# Patient Record
Sex: Female | Born: 1975 | Race: Black or African American | Hispanic: No | Smoking: Never smoker
Health system: Southern US, Community
[De-identification: ages and names within clinical notes are randomized; demographics above are authoritative.]

## PROBLEM LIST (undated history)

## (undated) DIAGNOSIS — G8929 Other chronic pain: Secondary | ICD-10-CM

## (undated) DIAGNOSIS — R7303 Prediabetes: Secondary | ICD-10-CM

## (undated) DIAGNOSIS — K589 Irritable bowel syndrome without diarrhea: Secondary | ICD-10-CM

## (undated) DIAGNOSIS — K829 Disease of gallbladder, unspecified: Secondary | ICD-10-CM

## (undated) DIAGNOSIS — E739 Lactose intolerance, unspecified: Secondary | ICD-10-CM

## (undated) DIAGNOSIS — K219 Gastro-esophageal reflux disease without esophagitis: Secondary | ICD-10-CM

## (undated) DIAGNOSIS — E7800 Pure hypercholesterolemia, unspecified: Secondary | ICD-10-CM

## (undated) DIAGNOSIS — Z91018 Allergy to other foods: Secondary | ICD-10-CM

## (undated) DIAGNOSIS — F419 Anxiety disorder, unspecified: Secondary | ICD-10-CM

## (undated) DIAGNOSIS — G4730 Sleep apnea, unspecified: Secondary | ICD-10-CM

## (undated) DIAGNOSIS — E119 Type 2 diabetes mellitus without complications: Secondary | ICD-10-CM

## (undated) DIAGNOSIS — E349 Endocrine disorder, unspecified: Secondary | ICD-10-CM

## (undated) DIAGNOSIS — I10 Essential (primary) hypertension: Secondary | ICD-10-CM

## (undated) HISTORY — DX: Sleep apnea, unspecified: G47.30

## (undated) HISTORY — DX: Anxiety disorder, unspecified: F41.9

## (undated) HISTORY — DX: Irritable bowel syndrome, unspecified: K58.9

## (undated) HISTORY — DX: Pure hypercholesterolemia, unspecified: E78.00

## (undated) HISTORY — DX: Endocrine disorder, unspecified: E34.9

## (undated) HISTORY — DX: Disease of gallbladder, unspecified: K82.9

## (undated) HISTORY — DX: Essential (primary) hypertension: I10

## (undated) HISTORY — DX: Allergy to other foods: Z91.018

## (undated) HISTORY — PX: CHOLECYSTECTOMY: SHX55

## (undated) HISTORY — DX: Lactose intolerance, unspecified: E73.9

## (undated) HISTORY — DX: Prediabetes: R73.03

## (undated) HISTORY — DX: Gastro-esophageal reflux disease without esophagitis: K21.9

## (undated) HISTORY — DX: Type 2 diabetes mellitus without complications: E11.9

## (undated) HISTORY — DX: Other chronic pain: G89.29

---

## 1999-11-06 ENCOUNTER — Encounter: Admission: RE | Admit: 1999-11-06 | Discharge: 2000-02-04 | Payer: Self-pay

## 2000-02-24 ENCOUNTER — Other Ambulatory Visit: Admission: RE | Admit: 2000-02-24 | Discharge: 2000-02-24 | Payer: Self-pay | Admitting: *Deleted

## 2001-03-04 ENCOUNTER — Other Ambulatory Visit: Admission: RE | Admit: 2001-03-04 | Discharge: 2001-03-04 | Payer: Self-pay | Admitting: Gynecology

## 2002-03-04 ENCOUNTER — Other Ambulatory Visit: Admission: RE | Admit: 2002-03-04 | Discharge: 2002-03-04 | Payer: Self-pay | Admitting: *Deleted

## 2002-08-15 ENCOUNTER — Encounter: Payer: Self-pay | Admitting: Gastroenterology

## 2002-08-15 ENCOUNTER — Encounter: Admission: RE | Admit: 2002-08-15 | Discharge: 2002-08-15 | Payer: Self-pay | Admitting: Gastroenterology

## 2002-08-17 ENCOUNTER — Ambulatory Visit (HOSPITAL_COMMUNITY): Admission: RE | Admit: 2002-08-17 | Discharge: 2002-08-17 | Payer: Self-pay | Admitting: Gastroenterology

## 2002-08-17 ENCOUNTER — Encounter: Payer: Self-pay | Admitting: Gastroenterology

## 2002-08-18 ENCOUNTER — Emergency Department (HOSPITAL_COMMUNITY): Admission: EM | Admit: 2002-08-18 | Discharge: 2002-08-19 | Payer: Self-pay | Admitting: Emergency Medicine

## 2002-12-14 ENCOUNTER — Encounter: Admission: RE | Admit: 2002-12-14 | Discharge: 2003-03-14 | Payer: Self-pay | Admitting: Family Medicine

## 2003-03-06 ENCOUNTER — Other Ambulatory Visit: Admission: RE | Admit: 2003-03-06 | Discharge: 2003-03-06 | Payer: Self-pay | Admitting: Gynecology

## 2003-10-31 ENCOUNTER — Ambulatory Visit (HOSPITAL_COMMUNITY): Admission: RE | Admit: 2003-10-31 | Discharge: 2003-10-31 | Payer: Self-pay | Admitting: Gastroenterology

## 2004-03-06 ENCOUNTER — Other Ambulatory Visit: Admission: RE | Admit: 2004-03-06 | Discharge: 2004-03-06 | Payer: Self-pay | Admitting: Gynecology

## 2004-05-14 ENCOUNTER — Encounter: Admission: RE | Admit: 2004-05-14 | Discharge: 2004-05-14 | Payer: Self-pay | Admitting: Gastroenterology

## 2005-03-07 ENCOUNTER — Other Ambulatory Visit: Admission: RE | Admit: 2005-03-07 | Discharge: 2005-03-07 | Payer: Self-pay | Admitting: Gynecology

## 2005-04-30 ENCOUNTER — Encounter: Admission: RE | Admit: 2005-04-30 | Discharge: 2005-04-30 | Payer: Self-pay | Admitting: Gynecology

## 2005-08-27 ENCOUNTER — Encounter: Admission: RE | Admit: 2005-08-27 | Discharge: 2005-09-10 | Payer: Self-pay | Admitting: Gynecology

## 2005-10-24 ENCOUNTER — Ambulatory Visit: Payer: Self-pay | Admitting: Obstetrics & Gynecology

## 2005-10-24 ENCOUNTER — Ambulatory Visit (HOSPITAL_COMMUNITY): Admission: RE | Admit: 2005-10-24 | Discharge: 2005-10-24 | Payer: Self-pay | Admitting: Gynecology

## 2005-11-16 ENCOUNTER — Inpatient Hospital Stay (HOSPITAL_COMMUNITY): Admission: AD | Admit: 2005-11-16 | Discharge: 2005-11-21 | Payer: Self-pay | Admitting: Gynecology

## 2005-11-17 ENCOUNTER — Encounter (INDEPENDENT_AMBULATORY_CARE_PROVIDER_SITE_OTHER): Payer: Self-pay | Admitting: *Deleted

## 2005-11-22 ENCOUNTER — Encounter: Admission: RE | Admit: 2005-11-22 | Discharge: 2005-12-10 | Payer: Self-pay | Admitting: Gynecology

## 2005-12-29 ENCOUNTER — Other Ambulatory Visit: Admission: RE | Admit: 2005-12-29 | Discharge: 2005-12-29 | Payer: Self-pay | Admitting: Gynecology

## 2006-12-31 ENCOUNTER — Other Ambulatory Visit: Admission: RE | Admit: 2006-12-31 | Discharge: 2006-12-31 | Payer: Self-pay | Admitting: Gynecology

## 2007-10-11 ENCOUNTER — Ambulatory Visit: Payer: Self-pay | Admitting: Women's Health

## 2008-01-28 ENCOUNTER — Ambulatory Visit: Payer: Self-pay | Admitting: Women's Health

## 2008-01-28 ENCOUNTER — Other Ambulatory Visit: Admission: RE | Admit: 2008-01-28 | Discharge: 2008-01-28 | Payer: Self-pay | Admitting: Gynecology

## 2008-01-28 ENCOUNTER — Encounter: Payer: Self-pay | Admitting: Women's Health

## 2008-03-29 ENCOUNTER — Ambulatory Visit: Payer: Self-pay | Admitting: Women's Health

## 2008-06-16 ENCOUNTER — Encounter: Admission: RE | Admit: 2008-06-16 | Discharge: 2008-06-16 | Payer: Self-pay | Admitting: Family Medicine

## 2008-06-27 ENCOUNTER — Ambulatory Visit: Payer: Self-pay | Admitting: Gynecology

## 2008-08-02 ENCOUNTER — Encounter (INDEPENDENT_AMBULATORY_CARE_PROVIDER_SITE_OTHER): Payer: Self-pay | Admitting: General Surgery

## 2008-08-02 ENCOUNTER — Ambulatory Visit (HOSPITAL_COMMUNITY): Admission: RE | Admit: 2008-08-02 | Discharge: 2008-08-02 | Payer: Self-pay | Admitting: General Surgery

## 2009-08-04 ENCOUNTER — Emergency Department (HOSPITAL_COMMUNITY): Admission: EM | Admit: 2009-08-04 | Discharge: 2009-08-04 | Payer: Self-pay | Admitting: Emergency Medicine

## 2010-02-25 ENCOUNTER — Other Ambulatory Visit: Payer: Self-pay | Admitting: Family Medicine

## 2010-02-25 ENCOUNTER — Other Ambulatory Visit (HOSPITAL_COMMUNITY)
Admission: RE | Admit: 2010-02-25 | Discharge: 2010-02-25 | Disposition: A | Discharge status: Home / Self Care | Payer: 59 | Source: Ambulatory Visit | Attending: Family Medicine | Admitting: Family Medicine

## 2010-02-25 DIAGNOSIS — Z1159 Encounter for screening for other viral diseases: Secondary | ICD-10-CM | POA: Insufficient documentation

## 2010-02-25 DIAGNOSIS — Z124 Encounter for screening for malignant neoplasm of cervix: Secondary | ICD-10-CM | POA: Insufficient documentation

## 2010-04-21 LAB — CBC
Hemoglobin: 12.2 g/dL (ref 12.0–15.0)
MCHC: 33.6 g/dL (ref 30.0–36.0)
RBC: 4.06 MIL/uL (ref 3.87–5.11)

## 2010-04-21 LAB — BASIC METABOLIC PANEL
CO2: 28 mEq/L (ref 19–32)
Calcium: 9 mg/dL (ref 8.4–10.5)
Chloride: 104 mEq/L (ref 96–112)
GFR calc Af Amer: >60 mL/min (ref >60)
Sodium: 139 mEq/L (ref 135–145)

## 2010-04-21 LAB — DIFFERENTIAL
Basophils Relative: 1 % (ref 0–1)
Lymphs Abs: 2 10*3/uL (ref 0.7–4.0)
Monocytes Absolute: 0.6 10*3/uL (ref 0.1–1.0)
Monocytes Relative: 9 % (ref 3–12)
Neutro Abs: 4.4 10*3/uL (ref 1.7–7.7)

## 2010-05-28 NOTE — Op Note (Signed)
Robin Glover, Robin Glover         ACCOUNT NO.:  [id number]   MEDICAL RECORD NO.:  [id number]          PATIENT TYPE:  AMB   LOCATION:  DAY                          FACILITY:  [facility]   PHYSICIAN:  Almond Lint, MD       DATE OF BIRTH:  August 16, 1975   DATE OF PROCEDURE:  08/02/2008  DATE OF DISCHARGE:                               OPERATIVE REPORT   PREOPERATIVE DIAGNOSES:  Chronic cholelithiasis.   POSTOPERATIVE DIAGNOSIS:  Chronic cholelithiasis.   PROCEDURE PERFORMED:  Laparoscopic cholecystectomy.   SURGEON:  Almond Lint, M.D.   ASSISTANT:  None.   ANESTHESIA:  General plus local.   FINDINGS:  Stones in gallbladder.   SPECIMENS:  Gallbladder to pathology.   ESTIMATED BLOOD LOSS:  Minimal.   COMPLICATIONS:  None known.   PROCEDURE:  Robin Glover was identified in the holding area and  taken to the operating room,  where she was placed supine on the  operating room table.  General anesthesia was induced.  Her abdomen was  prepped and draped in a sterile fashion.  Time-out was performed,  according to the surgical safety check list.  When all was correct we  continued.   The infraumbilical skin was anesthetized with a mixture of 0.25%  Marcaine with epinephrine and 1% lidocaine plain.  A curvilinear  incision was made with a #11 blade below the umbilicus.  The  subcutaneous tissues were dissected bluntly with a Kelly clamp.  The  umbilical stalk was elevated with a Kocher clamp.  The fascia was  cleaned off bluntly and Kochers were then passed to either side of the  midline on the fascia.  An #11 blade was used to incise the fascia.  Entrance into the peritoneal cavity was confirmed with a Kelly clamp.  A  0 Vicryl was used to place a pursestring around the umbilical fascial  incision.  The Hasson trocar was introduced into the abdomen and held  into place with the tails of the suture.  Pneumoperitoneum was achieved  to a pressure of 15 mmHg.   The patient was then  placed into a reverse Trendelenburg position,  rotated to the left.  The skin in the epigastrium and peritoneum was  anesthetized with local anesthetic.  A 12-mm incision was made in the  epigastrium and the 11-mm trocar was advanced under direct  visualization.  Two 5-mm ports were placed in the right upper quadrant  in similar fashion.  The gallbladder was elevated toward the head of the  patient and the infundibulum was retracted toward the right.  There was  a small amount of peritoneal adhesions and these were taken down  bluntly.  The overlying peritoneum over the gallbladder was taken with  the hook cautery and the cystic duct and cystic artery were skeletonized  with the [facility].  These were very close together.  The  artery was clipped when blunt dissection made it start oozing just a  little.  There were two clips placed proximally and one on the specimen  side.  This was then transected.  There was some additional bleeding  just below it and  this was clipped as well.  The cystic duct was clipped  three times on the patient side, once on the specimen side.  This was  the only tubular structure directly entering the gallbladder.  Prior to  clipping this, the peritoneum had been opened up all the way up to the  liver edge, critical views obtained.  Higher up in the gallbladder  fossa, there was a small vein entering the gallbladder, as well, and  this was clipped twice on the patient side, once on the specimen side.  The gallbladder was taken off the gallbladder fossa with the Bovie  electrocautery.  The camera was moved to the epigastric port and then  the gallbladder was retrieved through the umbilical port with the  EndoCatch bag.   The camera was replaced at the umbilical port and the gallbladder fossa  was examined for bleeding.  There was no evidence of bleeding.  The  clips were also examined and there was no evidence of bile leakage or  bleeding from the clips.   The gallbladder fossa and right upper quadrant  were irrigated.  The gallbladder fossa was re-examined and appeared  normal.  The right upper quadrant epigastric ports were removed under  direct visualization.  The pursestring suture was tied down at the  umbilical incision and there was no additional palpable defect  appreciable.  The skin of all incisions was closed using a 4-0 Monocryl  in subcuticular fashion.  The wounds were cleaned, dried and dressed  with Dermabond.  The patient was awakened from anesthesia and taken to  PACU in stable condition.      Almond Lint, MD  Electronically Signed     FB/MEDQ  D:  08/02/2008  T:  08/02/2008  Job:  [id number]   cc:   Charlestine Night, FNP

## 2010-05-31 NOTE — Op Note (Signed)
NAMECAYCEE, Robin Glover              ACCOUNT NO.:  [id number]   MEDICAL RECORD NO.:  [id number]          PATIENT TYPE:  AMB   LOCATION:  ENDO                         FACILITY:  [facility]   PHYSICIAN:  John C. Madilyn Fireman, M.D.    DATE OF BIRTH:  11/28/1975   DATE OF PROCEDURE:  10/31/2003  DATE OF DISCHARGE:                                 OPERATIVE REPORT   PROCEDURE:  Esophagogastroduodenoscopy with biopsy.   INDICATIONS FOR PROCEDURE:  Persistent left upper quadrant abdominal pain,  nausea, bloating, gas as well as rectal bleeding.  Not adequately relieved  by Zelnorm or proton pump inhibitor.   PROCEDURE:  The patient was placed in the left lateral decubitus position  and placed on the pulse monitor with continuous low flow oxygen delivered by  nasal cannula.  She was sedated with 100 mcg IV fentanyl and 8 mg IV Versed.  The Olympus video endoscope was advanced under direct vision into the  oropharynx and esophagus.  The esophagus was straight and of normal caliber  with the squamocolumnar line at 38 cm.  There was no visible hiatal hernia,  ring, stricture, or other abnormality.  The GE junction and stomach was  entered, and a small amount of liquid secretions were suctioned from the  fundus.  Retroflexed view of the cardia was unremarkable.  The fundus, body,  antrum, and pylorus all appeared normal.  The duodenum was entered.  Both  bulb and second portions were well inspected and appeared to be within  normal limits.  The scope was advanced back into the stomach and a CLO test  obtained.  The scope was then withdrawn.  The patient returned to the  recovery room in stable condition.  She tolerated the procedure well, and  there were no immediate complications.   IMPRESSION:  Normal endoscopy.   PLAN:  Await CLO test and treat for eradication of Helicobacter if positive,  otherwise continue to treat for constipation and predominant irritable bowel  syndrome.      JCH/MEDQ  D:   10/31/2003  T:  10/31/2003  Job:  [id number]   cc:   Clovis Riley, FNP  [facility]

## 2010-05-31 NOTE — Op Note (Signed)
Robin Glover, Robin Glover         ACCOUNT NO.:  [id number]   MEDICAL RECORD NO.:  [id number]          PATIENT TYPE:  INP   LOCATION:  9374                          FACILITY:  [facility]   PHYSICIAN:  Juan H. Lily Peer, M.D.DATE OF BIRTH:  [birth year]   DATE OF PROCEDURE:  DATE OF DISCHARGE:                                 OPERATIVE REPORT   DATE OF PROCEDURE:  November 17, 2005   SURGEON:  Gaetano Hawthorne. Lily Peer, M.D.   FIRST ASSISTANT:  Ronald L. Earlene Plater, M.D.   INDICATIONS FOR OPERATION:  An 35 year old gravida 1, para 0 at 27 weeks'  gestation, morbidly obese, gestational diabetic, was admitted in early labor  and was sent for induction the following day.  She had significantly  increasing blood pressures with the highest blood pressure 204/123.  Her  cervix was 5 cm with poor progression in her first stage of labor, despite  adequate labor pattern.  She was taken for primary cesarean section.  The  last blood sugar had been recorded at 84 right before surgery.   PREOPERATIVE DIAGNOSIS:  1. Term intrauterine pregnancy.  2. Morbidly obese  3. Gestational diabetic (insulin and oral hypoglycemic agent).  4. Pre-eclampsia.  5. Arrest of first stage of labor.  6. Non-reassuring fetal heart rate tracing.   POSTOPERATIVE DIAGNOSES:  1. Term intrauterine pregnancy.  2. Morbidly obese  3. Gestational diabetic (insulin and oral hypoglycemic agent).  4. Pre-eclampsia.  5. Arrest of first stage of labor.  6. Non-reassuring fetal heart rate tracing.   OPERATION PERFORMED:   ANESTHESIA:  Epidural.   SURGEON:  Juan H. Lily Peer, M.D.   ASSISTANT:  Chester Holstein. Earlene Plater, M.D.   PROCEDURE PERFORMED:  Primary lower uterine segment transverse cesarean  section.   FINDINGS:  Viable female infant, Apgars of 8 and 9. Weight pending at time  of this dictation.  Arterial cord pH of 7.21. Normal maternal pelvic  anatomy.   DESCRIPTION OF OPERATION:  After the patient adequately counseled she was  taken  to the operating room where she underwent re dosing through her  epidural catheter.  She had received 20 mg of labetalol prior to being taken  to the operating room secondary to her blood pressure which had gone as high  as 204/123.  Fetal heart rate had dropped into the 80's and returned back to  120's. Scalp electrode was removed.  The abdomen was prepped and draped in  usual sterile fashion.  A Pfannenstiel skin incision was made 2 cm above the  symphysis pubis.  The incision was carried down through skin and  subcutaneous tissue, down to the rectus fascia whereby midline nick was  made.  The fascia was incised in transverse fashion.  The visceral  peritoneum was entered cautiously.  The bladder flap was established.  The  lower uterine segment was incised in transverse fashion.  The fetal  umbilical cord had extruded out from the incision.  Upon making the  incision, the newborn's head was delivered.  The nasopharyngeal area was  bulb suctioned.  The newborn was delivered.  The cord was doubly clamped and  excised.  The newborn  gave immediate cry, was shown to the parents and  passed off to the neonatologists who were in attendance and gave the above-  mentioned parameters.  After cord blood was obtained the placenta was  delivered from the intrauterine cavity and passed off, since the patient was  donating her cord blood for public blood banking.  After this, Pitocin drip  was started.  The patient received a gram of Ancef. The uterus was  exteriorized.  Intrauterine cavity was swept clear of remaining products of  conception.  The transverse incision was closed with a running locking  stitch of  0 Vicryl suture, followed by an imbricating stitch of 0 Vicryl  suture.  The tubes and ovaries were normal.  The uterus placed back into the  pelvic cavity.  The pelvic cavity was copiously irrigated with normal saline  solution. After ascertaining adequate hemostasis the fascia was closed  with  a running stitch of 0 Vicryl suture and subcutaneous bleeders were Bovie  cauterized.  The skin was reapproximated with skin clips followed by  Xeroform gauze and 4x8 dressing.  The patient was transferred to the  recovery room with stable vital signs.  Blood loss 600 cc.  Urine output 150  cc.  IV fluids 2 liters of lactated Ringer's.  The patient's last blood  pressure reading in the operating room was 120/48.  Will keep her on  magnesium sulfate, 2 grams per hour, and will titrate her blood pressures  accordingly if indicated, in addition to the above measures.      Juan H. Lily Peer, M.D.  Electronically Signed     JHF/MEDQ  D:  11/17/2005  T:  11/18/2005  Job:  [id number]

## 2010-05-31 NOTE — H&P (Signed)
NAME:  Robin Glover, Robin Glover             ACCOUNT NO.:  0   MEDICAL RECORD NO.:  [id number]           PATIENT TYPE:   LOCATION:                                 FACILITY:   PHYSICIAN:  Timothy P. Fontaine, M.D.DATE OF BIRTH:  1975/11/04   DATE OF ADMISSION:  11/17/2005  DATE OF DISCHARGE:                                HISTORY & PHYSICAL   CHIEF COMPLAINT:  1. Pregnancy at term.  2. Gestational diabetes, insulin- and glyburide-dependent.  3. Obesity.   HISTORY OF PRESENT ILLNESS:  A 35 year old, G1, P0 female at 3 weeks'  gestation, being followed for gestational diabetes on NPH insulin in the  p.m. of 20 units and glyburide 5 mg oral hypoglycemic in the a.m.  Her  glucoses have been in good control.  She has had normal antepartum testing  with the last AFI at 8.8 cm and reactive NST.  Her beta-strep is negative  and she is being admitted for serial induction due to her diabetes at term.  For the remainder of her history, see her [place].   PHYSICAL EXAMINATION:  HEENT: Normal.  LUNGS:  Clear.  CARDIAC:  Regular rate, no murmurs, rubs or gallops.  ABDOMEN:  Gravid, vertex fetus.  Reactive NST, vertex presentation.  PELVIC:  Fingertip, 50%, -2 station.   ASSESSMENT/PLAN:  A 35 year old, G1, P0 at 39 weeks', gestational diabetes  on insulin and glyburide.  She had reassuring antepartum testing for serial  induction.  Cervidil in the p.m.  Pitocin in the a.m.  I reviewed with the  patient and her husband the options.  Continued expectant management versus  induction and  the issues of stillbirth with diabetes.  Her glucose control  has been overall good, although her fastings are now in the 90s and it is  felt most prudent, given her good dating at 9-40 weeks' gestation, to  proceed with delivery.  The patient agrees with the plan.  She is beta-strep  negative.  Will monitor glucoses in labor.  Can consider Glucommander if  they are significantly elevated.      Timothy P.  Fontaine, M.D.  Electronically Signed     TPF/MEDQ  D:  11/14/2005  T:  11/14/2005  Job:  [id number]

## 2010-05-31 NOTE — Op Note (Signed)
NAMEMURIAL, BEAM              ACCOUNT NO.:  [id number]   MEDICAL RECORD NO.:  [id number]          PATIENT TYPE:  AMB   LOCATION:  ENDO                         FACILITY:  [facility]   PHYSICIAN:  John C. Madilyn Fireman, M.D.    DATE OF BIRTH:  June 18, 1975   DATE OF PROCEDURE:  10/31/2003  DATE OF DISCHARGE:                                 OPERATIVE REPORT   PROCEDURE:  Flexible sigmoidoscopy.   INDICATIONS FOR PROCEDURE:  Rectal bleeding and left-sided abdominal pain.   PROCEDURE:  The patient was placed in the left lateral decubitus position  and placed on the pulse monitor with continuous low-flow oxygen delivered by  nasal cannula.  She was sedated with 2 mg of IV Versed in addition to  medicine given for the previous EGD.  The Olympus video colonoscope was  inserted into the rectum and advanced to about 65 cm, where some solid stool  was encountered.  I did not try to advance any further.  The mucosa of the  descending, sigmoid, and rectum appeared totally normal with no masses,  polyps, diverticula, or any inflammatory changes.  The rectum likewise  appeared normal, and retroflexed view of the anus revealed only small  internal hemorrhoids.  The scope was then withdrawn, and the patient  returned to the recovery room in stable condition.  She tolerated the  procedure well.  There were no immediate complications.   IMPRESSION:  Internal hemorrhoids, otherwise normal to 60 cm.      JCH/MEDQ  D:  10/31/2003  T:  10/31/2003  Job:  [id number]   cc:   Clovis Riley, FNP  [facility]

## 2010-05-31 NOTE — Discharge Summary (Signed)
NAMECHYLER, Robin Glover         ACCOUNT NO.:  [id number]   MEDICAL RECORD NO.:  [id number]          PATIENT TYPE:  INP   LOCATION:  9115                          FACILITY:  [facility]   PHYSICIAN:  Timothy P. Fontaine, M.D.DATE OF BIRTH:  Oct 10, 1975   DATE OF ADMISSION:  11/16/2005  DATE OF DISCHARGE:  11/21/2005                                 DISCHARGE SUMMARY   DISCHARGE DIAGNOSES:  1. Pregnancy at term.  2. Gestational diabetes.  3. Insulin and glyburide dependent.  4. Obesity.  5. Hypertension, pregnancy-induced  6. Arrest of first stage of labor, nonreassuring fetal tracing.   PROCEDURE:  Primary low transverse cervical cesarean section, November 17, 2005, Dr. Reynaldo Minium, pathology pending.   HOSPITAL COURSE:  A 35 year old G1 P0 female presents in labor, contracting  every 5 minutes.  Initial evaluation revealed hypertension with blood  pressures in the 180/110, 160/100 range.  She was a fingertip dilated and  50% effaced.  The patient was admitted.  Her PIH labs were normal.  She  required initial doses of labetalol IV to control her hypertension.  She was  placed on magnesium sulfate and progressed through labor to 5 cm dilatation  at which point she did not make any further progress and was noted to have  decreased variability with scalp stem minimal and ultimately was taken for a  primary low transverse cervical cesarean section by Dr. Reynaldo Minium  producing a normal female infant, Apgars 8 and 9.  Cord pH 7.21.  The  patient's postoperative course was complicated by hypertensive episodes  requiring IV labetalol, and ultimately she was discharged on labetalol 300  mg b.i.d. with blood pressures in the 140/80-90 range.  The patient will  continue on her labetalol postpartum and will be followed within the week of  discharge for blood pressure follow up.  She was given signs and symptoms of  elevated blood pressure for a.s.a.p. call precautions.  She also has  continued on her glyburide 5 mg.  She was on insulin which was discontinued,  and her glucoses were reasonable with a fasting of 109 noted prior to  discharge.  She will continue on her glyburide and continue to monitor her  glucoses at home, and the ultimate plan after her postpartum release will be  for her to follow up with an endocrinologist.  The patient did receive  routine precautions, instructions, and follow up.  A prescription for Tylox  #30, 1-2 p.o. q. 4-6 h. p.r.n. pain.  Blood type is O+, and her rubella  titer is positive.  Her incision was noted to be oozing the day of  discharge.  The staples were removed.  There was a small opening at the scan  at the mid incision, but there was no evidence of hematoma, infection, or  collection, and the skin was reapproximated using Steri-Strips and Benzoin  and, again, postoperative wound care was discussed with the patient.  She  will be reassessed at her follow up appointment in 1 week.  The patient's  questions were answered, and she is comfortable with discharge at this time.  Timothy P. Fontaine, M.D.  Electronically Signed    TPF/MEDQ  D:  11/21/2005  T:  11/21/2005  Job:  [id number]

## 2010-06-11 ENCOUNTER — Emergency Department (HOSPITAL_COMMUNITY): Payer: 59

## 2010-06-11 ENCOUNTER — Emergency Department (HOSPITAL_COMMUNITY)
Admission: EM | Admit: 2010-06-11 | Discharge: 2010-06-12 | Disposition: A | Discharge status: Home / Self Care | Payer: 59 | Attending: Emergency Medicine | Admitting: Emergency Medicine

## 2010-06-11 DIAGNOSIS — R072 Precordial pain: Secondary | ICD-10-CM | POA: Insufficient documentation

## 2010-06-11 DIAGNOSIS — I10 Essential (primary) hypertension: Secondary | ICD-10-CM | POA: Insufficient documentation

## 2010-06-11 DIAGNOSIS — M940 Chondrocostal junction syndrome [Tietze]: Secondary | ICD-10-CM | POA: Insufficient documentation

## 2010-06-11 LAB — COMPREHENSIVE METABOLIC PANEL
CO2: 28 mEq/L (ref 19–32)
Calcium: 9.1 mg/dL (ref 8.4–10.5)
Chloride: 98 mEq/L (ref 96–112)
Creatinine, Ser: 0.63 mg/dL (ref 0.4–1.2)
GFR calc non Af Amer: >60 mL/min (ref >60)
Glucose, Bld: 99 mg/dL (ref 70–99)
Total Bilirubin: 0.2 mg/dL — ABNORMAL LOW (ref 0.3–1.2)

## 2010-06-11 LAB — CBC
HCT: 38.2 % (ref 36.0–46.0)
Hemoglobin: 12.9 g/dL (ref 12.0–15.0)
MCH: 29.7 pg (ref 26.0–34.0)
MCHC: 33.8 g/dL (ref 30.0–36.0)
MCV: 88 fL (ref 78.0–100.0)

## 2010-06-11 LAB — DIFFERENTIAL
Basophils Relative: 0 % (ref 0–1)
Lymphocytes Relative: 42 % (ref 12–46)
Lymphs Abs: 3.7 10*3/uL (ref 0.7–4.0)
Monocytes Absolute: 0.8 10*3/uL (ref 0.1–1.0)
Monocytes Relative: 9 % (ref 3–12)
Neutro Abs: 4.2 10*3/uL (ref 1.7–7.7)

## 2010-06-11 LAB — CK TOTAL AND CKMB (NOT AT ARMC)
Relative Index: 1.2 (ref 0.0–2.5)
Total CK: 124 U/L (ref 7–177)

## 2010-06-11 LAB — LIPASE, BLOOD: Lipase: 29 U/L (ref 11–59)

## 2011-04-29 ENCOUNTER — Other Ambulatory Visit: Payer: Self-pay | Admitting: Family Medicine

## 2011-04-29 ENCOUNTER — Other Ambulatory Visit (HOSPITAL_COMMUNITY)
Admission: RE | Admit: 2011-04-29 | Discharge: 2011-04-29 | Disposition: A | Discharge status: Home / Self Care | Payer: Managed Care, Other (non HMO) | Source: Ambulatory Visit | Attending: Family Medicine | Admitting: Family Medicine

## 2011-04-29 DIAGNOSIS — Z1159 Encounter for screening for other viral diseases: Secondary | ICD-10-CM | POA: Insufficient documentation

## 2011-04-29 DIAGNOSIS — Z113 Encounter for screening for infections with a predominantly sexual mode of transmission: Secondary | ICD-10-CM | POA: Insufficient documentation

## 2011-04-29 DIAGNOSIS — Z124 Encounter for screening for malignant neoplasm of cervix: Secondary | ICD-10-CM | POA: Insufficient documentation

## 2012-11-23 ENCOUNTER — Ambulatory Visit: Payer: Self-pay | Admitting: Women's Health

## 2012-12-08 ENCOUNTER — Ambulatory Visit (INDEPENDENT_AMBULATORY_CARE_PROVIDER_SITE_OTHER): Payer: BC Managed Care – PPO | Admitting: Women's Health

## 2012-12-08 ENCOUNTER — Encounter: Payer: Self-pay | Admitting: Women's Health

## 2012-12-08 VITALS — BP 130/84 | Ht 66.0 in | Wt 258.0 lb

## 2012-12-08 DIAGNOSIS — Z309 Encounter for contraceptive management, unspecified: Secondary | ICD-10-CM

## 2012-12-08 DIAGNOSIS — IMO0001 Reserved for inherently not codable concepts without codable children: Secondary | ICD-10-CM

## 2012-12-08 DIAGNOSIS — N898 Other specified noninflammatory disorders of vagina: Secondary | ICD-10-CM

## 2012-12-08 DIAGNOSIS — Z01419 Encounter for gynecological examination (general) (routine) without abnormal findings: Secondary | ICD-10-CM

## 2012-12-08 DIAGNOSIS — I10 Essential (primary) hypertension: Secondary | ICD-10-CM | POA: Insufficient documentation

## 2012-12-08 DIAGNOSIS — Z113 Encounter for screening for infections with a predominantly sexual mode of transmission: Secondary | ICD-10-CM

## 2012-12-08 LAB — WET PREP FOR TRICH, YEAST, CLUE

## 2012-12-08 MED ORDER — METRONIDAZOLE 0.75 % VA GEL: VAGINAL | Status: DC

## 2012-12-08 MED ORDER — NORETHINDRONE 0.35 MG PO TABS: 1.0000 | ORAL_TABLET | Freq: Every day | ORAL | Status: DC

## 2012-12-08 NOTE — Patient Instructions (Signed)

## 2012-12-08 NOTE — Progress Notes (Signed)
Robin Glover [birth year] [id number]    History:    The patient presents for annual exam.  Monthly cycle currently not sexually active. Normal Pap history.  Hypertension treated by primary care.  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 GDM on insulin. Parents diabetes and hypertension.   ROS:  A  ROS was performed and pertinent positives and negatives are included in the history.  Exam:  Filed Vitals:   12/08/12 1415  BP: 130/84    General appearance:  obese Head/Neck:  Normal, without cervical or supraclavicular adenopathy. Thyroid:  Symmetrical, normal in size, without palpable masses or nodularity. Respiratory  Effort:  Normal  Auscultation:  Clear without wheezing or rhonchi Cardiovascular  Auscultation:  Regular rate, without rubs, murmurs or gallops  Edema/varicosities:  Not grossly evident Abdominal  Soft,nontender, without masses, guarding or rebound.  Liver/spleen:  No organomegaly noted  Hernia:  None appreciated  Skin  Inspection:  Grossly normal  Palpation:  Grossly normal Neurologic/psychiatric  Orientation:  Normal with appropriate conversation.  Mood/affect:  Normal  Genitourinary    Breasts: Examined lying and sitting.     Right: Without masses, retractions, discharge or axillary adenopathy.     Left: Without masses, retractions, discharge or axillary adenopathy.   Inguinal/mons:  Normal without inguinal adenopathy  External genitalia:  Normal  BUS/Urethra/Skene's glands:  Normal  Bladder:  Normal  Vagina: Minimal erythema, wet prep positive for clues, and many bacteria appear  Cervix:  Normal  Uterus:   normal in size, shape and contour.  Midline and mobile  Adnexa/parametria:     Rt: Without masses or tenderness.   Lt: Without masses or tenderness.  Anus and perineum: Normal  Digital rectal exam: Normal sphincter  tone without palpated masses or tenderness  Assessment/Plan:  37 y.o. DBF G1P1 for annual exam with complaint of vaginal irritation.  Bacteria vaginosis STD screen Morbid obesity Hypertension- labs and meds  Plan: MetroGel vaginal cream 1 applicator at bedtime x5, alcohol precautions reviewed. Contraception reviewed, prescription for Micronor if becomes sexually active. Condoms, reviewed no placebo week. Has used in the past without a problem. Reviewed importance of increasing regular exercise and decreasing calories for weight loss. GC/Chlamydia, HIV, hep B, C., RPR. Pap normal 2013, new screening guidelines reviewed.    Harrington Challenger [facility], 5:16 PM 12/08/2012

## 2012-12-09 LAB — URINALYSIS W MICROSCOPIC + REFLEX CULTURE
Bacteria, UA: NONE SEEN
Bilirubin Urine: NEGATIVE
Crystals: NONE SEEN
Glucose, UA: NEGATIVE mg/dL
Ketones, ur: NEGATIVE mg/dL
Protein, ur: NEGATIVE mg/dL
Specific Gravity, Urine: >1.03 — ABNORMAL HIGH (ref 1.005–1.030)
Urobilinogen, UA: 0.2 mg/dL (ref 0.0–1.0)

## 2012-12-09 LAB — GC/CHLAMYDIA PROBE AMP: GC Probe RNA: NEGATIVE

## 2012-12-09 LAB — HIV ANTIBODY (ROUTINE TESTING W REFLEX): HIV: NONREACTIVE

## 2012-12-09 LAB — HEPATITIS B SURFACE ANTIGEN: Hepatitis B Surface Ag: NEGATIVE

## 2012-12-09 LAB — RPR

## 2012-12-15 ENCOUNTER — Encounter: Payer: Self-pay | Admitting: Gynecology

## 2013-11-14 ENCOUNTER — Encounter: Payer: Self-pay | Admitting: Women's Health

## 2013-12-28 ENCOUNTER — Other Ambulatory Visit (HOSPITAL_COMMUNITY)
Admission: RE | Admit: 2013-12-28 | Discharge: 2013-12-28 | Disposition: A | Discharge status: Home / Self Care | Payer: Managed Care, Other (non HMO) | Source: Ambulatory Visit | Attending: Women's Health | Admitting: Women's Health

## 2013-12-28 ENCOUNTER — Encounter: Payer: Self-pay | Admitting: Women's Health

## 2013-12-28 ENCOUNTER — Ambulatory Visit (INDEPENDENT_AMBULATORY_CARE_PROVIDER_SITE_OTHER): Payer: Managed Care, Other (non HMO) | Admitting: Women's Health

## 2013-12-28 VITALS — BP 124/80 | Ht 66.0 in | Wt 240.0 lb

## 2013-12-28 DIAGNOSIS — Z01411 Encounter for gynecological examination (general) (routine) with abnormal findings: Secondary | ICD-10-CM | POA: Insufficient documentation

## 2013-12-28 DIAGNOSIS — Z1151 Encounter for screening for human papillomavirus (HPV): Secondary | ICD-10-CM | POA: Insufficient documentation

## 2013-12-28 DIAGNOSIS — Z01419 Encounter for gynecological examination (general) (routine) without abnormal findings: Secondary | ICD-10-CM

## 2013-12-28 DIAGNOSIS — R87810 Cervical high risk human papillomavirus (HPV) DNA test positive: Secondary | ICD-10-CM | POA: Insufficient documentation

## 2013-12-28 NOTE — Progress Notes (Signed)
Robin Glover 10/03/1975 [id number]    History:    Presents for annual exam.  Monthly cycle/not sexually active. Normal Pap and mammogram history. Gestational diabetes on insulin. Hypertension primary care manages. Mother breast cancer age 37.  Past medical history, past surgical history, family history and social history were all reviewed and documented in the EPIC chart. Was a Runner, broadcasting/film/videoteacher, now working in a lab night shift.  Daughter Odette Fractionsa age 318 doing well. Parents diabetes, mother hypertension  ROS:  A  12 point ROS was performed and pertinent positives and negatives are included.  Exam:  Filed Vitals:   12/28/13 0837  BP: 124/80    General appearance:  Normal Thyroid:  Symmetrical, normal in size, without palpable masses or nodularity. Respiratory  Auscultation:  Clear without wheezing or rhonchi Cardiovascular  Auscultation:  Regular rate, without rubs, murmurs or gallops  Edema/varicosities:  Not grossly evident Abdominal  Soft,nontender, without masses, guarding or rebound.  Liver/spleen:  No organomegaly noted  Hernia:  None appreciated  Skin  Inspection:  Grossly normal   Breasts: Examined lying and sitting.     Right: Without masses, retractions, discharge or axillary adenopathy.     Left: Without masses, retractions, discharge or axillary adenopathy. Gentitourinary   Inguinal/mons:  Normal without inguinal adenopathy  External genitalia:  Normal  BUS/Urethra/Skene's glands:  Normal  Vagina:  Normal  Cervix:  Normal  Uterus:   normal in size, shape and contour.  Midline and mobile  Adnexa/parametria:     Rt: Without masses or tenderness.   Lt: Without masses or tenderness.  Anus and perineum: Normal  Digital rectal exam: Normal sphincter tone without palpated masses or tenderness  Assessment/Plan:  38 y.o. DBF G1P1 for annual exam with no complaints.  Obesity Regular monthly cycle/not sexually active Hypertension primary care manages labs and meds  Plan:  Continue annual screening mammogram, SBE's, calcium rich diet, vitamin D 1000 daily encouraged. Does not desire more children. Contraception options reviewed will return to office if needed. Pap with HR HPV typing, new screening guidelines reviewed. UA. Aware of importance of continuing to decrease calories and increase exercise for continued weight loss, down 18 pounds and prevention of diabetes.  Harrington ChallengerYOUNG,NANC[facility] J [facility] Medical CenterWHNP, 9:19 AM 12/28/2013

## 2013-12-28 NOTE — Patient Instructions (Signed)

## 2013-12-28 NOTE — Addendum Note (Signed)
Addended by: Kem ParkinsonBARNES, Ponce Skillman on: 12/28/2013 09:41 AM   Modules accepted: Orders, SmartSet

## 2013-12-29 LAB — URINALYSIS W MICROSCOPIC + REFLEX CULTURE
Bacteria, UA: NONE SEEN
Bilirubin Urine: NEGATIVE
Casts: NONE SEEN
Crystals: NONE SEEN
Glucose, UA: NEGATIVE mg/dL
HGB URINE DIPSTICK: NEGATIVE
Ketones, ur: NEGATIVE mg/dL
LEUKOCYTES UA: NEGATIVE
NITRITE: NEGATIVE
Protein, ur: NEGATIVE mg/dL
Specific Gravity, Urine: 1.007 (ref 1.005–1.030)
Squamous Epithelial / LPF: NONE SEEN
UROBILINOGEN UA: 0.2 mg/dL (ref 0.0–1.0)
pH: 6 (ref 5.0–8.0)

## 2013-12-29 LAB — CYTOLOGY - PAP

## 2014-01-23 ENCOUNTER — Encounter: Payer: Self-pay | Admitting: Gynecology

## 2014-01-23 ENCOUNTER — Ambulatory Visit (INDEPENDENT_AMBULATORY_CARE_PROVIDER_SITE_OTHER): Payer: Managed Care, Other (non HMO) | Admitting: Gynecology

## 2014-01-23 VITALS — BP 130/88

## 2014-01-23 DIAGNOSIS — N870 Mild cervical dysplasia: Secondary | ICD-10-CM | POA: Insufficient documentation

## 2014-01-23 DIAGNOSIS — Z803 Family history of malignant neoplasm of breast: Secondary | ICD-10-CM

## 2014-01-23 NOTE — Progress Notes (Signed)
Patient is a 38-year-old who presented to the office today to discuss her recent Pap smear that was done at time of her annual exam on 12/28/2013. Patient with no past history of any abnormal Pap smear.  Pap smear 12/28/2013 demonstrated low-grade squamous intraepithelial lesion HPV detected  Patient underwent a detail colposcopic evaluation today consisting of the external genitalia, perineum and perirectal region no lesions are noted. The speculum was introduced into the vagina and a thorough inspection of the vagina and fornix and cervix was undertaken. Acetic as was applied and a small acetowhite area was noted the transformation zone at the 6:00 position. This was biopsied. Of note with the use of the endocervical speculum the entire transformation zone was visualized. An ECC was obtained.  Physical Exam  Genitourinary:     Cervical biopsy from the 6:00 position was obtained as well as an ECC. Silver nitrate was used for hemostasis.  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 Colposcopic directed biopsy to the 6:00 position obtained. Discussed with the patient in the new ASCCP guidelines for management of patients with low-grade SIL. Will await results. On further discussion patient stated that she had a mammogram last year and when questioned she said that her mother had breast cancer the age of 53 and her first cousin died of breast cancer at the age of 49. I'm going [facility] for counseling for BRCA1 and BRCA2 testing. We'll also give her requisition to schedule a three-dimensional mammogram yearly. She was encouraged to do her monthly breast exam. 

## 2014-01-23 NOTE — Patient Instructions (Signed)
Cervical Dysplasia Cervical dysplasia is a condition in which a woman has abnormal changes in the cells of her cervix. The cervix is the opening to the uterus (womb). It is located between the vagina and the uterus. Cervical dysplasia may be the first sign of cervical cancer.  With early detection, treatment, and close follow-up care, nearly all cases of cervical dysplasia can be cured. If left untreated, dysplasia may become more severe.  CAUSES  Cervical dysplasia can be caused by a human papillomavirus (HPV) infection. RISK FACTORS   Having had a sexually transmitted disease, such as chlamydia or a human papillomavirus (HPV) infection.   Becoming sexually active before age 18.   Having had more than one sexual partner.   Not using protection during sexual intercourse, especially with new sexual partners.   Having had cancer of the vagina or vulva.   Having a sexual partner whose previous partner had cancer of the cervix or cervical dysplasia.   Having a sexual partner who has or has had cancer of the penis.   Having a weakened immune system (such as from having HIV or an organ transplant).   Being the daughter of a woman who took diethylstilbestrol(DES) during pregnancy.   Having a family history of cervical cancer.   Smoking. SIGNS AND SYMPTOMS  There are usually no symptoms. If there are symptoms, they may include:   Abnormal vaginal discharge.   Bleeding between periods or after intercourse.   Bleeding during menopause.   Pain during sexual intercourse (dyspareunia). DIAGNOSIS  A test called a Pap test may be done.During this test, cells are taken from the cervix and then looked at under a microscope. A test in which tissue is removed from the cervix (biopsy) may also be done if the Pap test is abnormal or if the cervix looks abnormal.  TREATMENT  Treatment varies based on the severity of the cervical dysplasia. Treatment may include:  Cryotherapy.  During cryotherapy, the abnormal cells are frozen with a steel-tip instrument.   A procedure to remove abnormal tissue from the cervix.  Surgery to remove abnormal tissue. This is usually done in serious cases of cervical dysplasia. Surgical options include:  A cone biopsy. This is a procedure in which the cervical canal and a portion of the center of the cervix are removed.   Hysterectomy. This is a surgery in which the uterus and cervix are removed. HOME CARE INSTRUCTIONS   Only take over-the-counter or prescription medicines for pain or discomfort as directed by your health care provider.   Do not use tampons, have sexual intercourse, or douche until your health care provider says it is okay.  Keep follow-up appointments as directed by your health care provider. Women who have been treated for cervical dysplasia should have regular pelvic exams and Pap tests. During the first year following treatment of cervical dysplasia, Pap tests should be done every 3-4 months. In the second year, they should be done every 6 months or as recommended by your health care provider.  To prevent the condition from developing again, practice safe sex. SEEK MEDICAL CARE IF:  You develop genital warts.  SEEK IMMEDIATE MEDICAL CARE IF:   Your menstrual period is heavier than normal.   You develop bright red bleeding, especially if you have blood clots.   You have a fever.   You have increasing cramps or pain not relieved with medicine.   You are light-headed, unusually weak, or have fainting spells.   You have abnormal   vaginal discharge.   You have abdominal pain. Document Released: 12/30/2004 Document Revised: 01/04/2013 Document Reviewed: 08/25/2012 ExitCare Patient Information 2015 [employer]. This information is not intended to replace advice given to you by your health care provider. Make sure you discuss any questions you have with your health care  provider. Colposcopy Colposcopy is a procedure to examine your cervix and vagina, or the area around the outside of your vagina, for abnormalities or signs of disease. The procedure is done using a lighted microscope called a colposcope. Tissue samples may be collected during the colposcopy if your health care provider finds any unusual cells. A colposcopy may be done if a woman has:  An abnormal Pap test. A Pap test is a medical test done to evaluate cells that are on the surface of the cervix.  A Pap test result that is suggestive of human papillomavirus (HPV). This virus can cause genital warts and is linked to the development of cervical cancer.  A sore on her cervix and the results of a Pap test were normal.  Genital warts on the cervix or in or around the outside of the vagina.  A mother who took the drug diethylstilbestrol (DES) while pregnant.  Painful intercourse.  Vaginal bleeding, especially after sexual intercourse. LET YOUR HEALTH CARE PROVIDER KNOW ABOUT:  Any allergies you have.  All medicines you are taking, including vitamins, herbs, eye drops, creams, and over-the-counter medicines.  Previous problems you or members of your family have had with the use of anesthetics.  Any blood disorders you have.  Previous surgeries you have had.  Medical conditions you have. RISKS AND COMPLICATIONS Generally, a colposcopy is a safe procedure. However, as with any procedure, complications can occur. Possible complications include:  Bleeding.  Infection.  Missed lesions. BEFORE THE PROCEDURE   Tell your health care provider if you have your menstrual period. A colposcopy typically is not done during menstruation.  For 24 hours before the colposcopy, do not:  Douche.  Use tampons.  Use medicines, creams, or suppositories in the vagina.  Have sexual intercourse. PROCEDURE  During the procedure, you will be lying on your back with your feet in foot rests (stirrups).  A warm metal or plastic instrument (speculum) will be placed in your vagina to keep it open and to allow the health care provider to see the cervix. The colposcope will be placed outside the vagina. It will be used to magnify and examine the cervix, vagina, and the area around the outside of the vagina. A small amount of liquid solution will be placed on the area that is to be viewed. This solution will make it easier to see the abnormal cells. Your health care provider will use tools to suck out mucus and cells from the canal of the cervix. Then he or she will record the location of the abnormal areas. If a biopsy is done during the procedure, a medicine will usually be given to numb the area (local anesthetic). You may feel mild pain or cramping while the biopsy is done. After the procedure, tissue samples collected during the biopsy will be sent to a lab for analysis. AFTER THE PROCEDURE  You will be given instructions on when to follow up with your health care provider for your test results. It is important to keep your appointment. Document Released: 03/22/2002 Document Revised: 09/01/2012 Document Reviewed: 07/29/2012 ExitCare Patient Information 2015 [employer]. This information is not intended to replace advice given to you by your health   care provider. Make sure you discuss any questions you have with your health care provider.  

## 2014-06-05 ENCOUNTER — Telehealth: Payer: Self-pay

## 2014-06-05 MED ORDER — NORETHINDRONE 0.35 MG PO TABS: 1.0000 | ORAL_TABLET | Freq: Every day | ORAL | Status: DC

## 2014-06-05 NOTE — Telephone Encounter (Addendum)
Patient called stating when she saw you in Dec for CE she did not need birth control pill Rx.  However, now she does and she asked if you could just send Rx for her?   You had her on Norethindrone 0.35 (Ortho Micronor) back in 2014.

## 2014-06-05 NOTE — Telephone Encounter (Signed)
Please call, Micronor 1 daily start with next cycle, condoms especially first month no placebo they become amenorrheic. Review slight risk for blood clots and strokes with Micronor but much less than with combination pills. If she would like to proceed with other options of IUD have her schedule appointment. History of hypertension and type 2 diabetes-primary care manages.

## 2014-06-05 NOTE — Telephone Encounter (Signed)
Patein advised. She opts for Micronor Rx. Instructed per note below.

## 2015-01-15 ENCOUNTER — Ambulatory Visit (INDEPENDENT_AMBULATORY_CARE_PROVIDER_SITE_OTHER): Payer: Managed Care, Other (non HMO) | Admitting: Women's Health

## 2015-01-15 ENCOUNTER — Encounter: Payer: Self-pay | Admitting: Women's Health

## 2015-01-15 ENCOUNTER — Other Ambulatory Visit (HOSPITAL_COMMUNITY)
Admission: RE | Admit: 2015-01-15 | Discharge: 2015-01-15 | Disposition: A | Discharge status: Home / Self Care | Payer: Managed Care, Other (non HMO) | Source: Ambulatory Visit | Attending: Women's Health | Admitting: Women's Health

## 2015-01-15 VITALS — BP 124/80 | Wt 259.0 lb

## 2015-01-15 DIAGNOSIS — Z3041 Encounter for surveillance of contraceptive pills: Secondary | ICD-10-CM | POA: Diagnosis not present

## 2015-01-15 DIAGNOSIS — N870 Mild cervical dysplasia: Secondary | ICD-10-CM | POA: Diagnosis not present

## 2015-01-15 DIAGNOSIS — Z1151 Encounter for screening for human papillomavirus (HPV): Secondary | ICD-10-CM | POA: Diagnosis not present

## 2015-01-15 DIAGNOSIS — Z1329 Encounter for screening for other suspected endocrine disorder: Secondary | ICD-10-CM | POA: Diagnosis not present

## 2015-01-15 DIAGNOSIS — Z13220 Encounter for screening for lipoid disorders: Secondary | ICD-10-CM

## 2015-01-15 DIAGNOSIS — Z01419 Encounter for gynecological examination (general) (routine) without abnormal findings: Secondary | ICD-10-CM

## 2015-01-15 DIAGNOSIS — Z01411 Encounter for gynecological examination (general) (routine) with abnormal findings: Secondary | ICD-10-CM | POA: Diagnosis present

## 2015-01-15 DIAGNOSIS — Z833 Family history of diabetes mellitus: Secondary | ICD-10-CM | POA: Diagnosis not present

## 2015-01-15 DIAGNOSIS — Z803 Family history of malignant neoplasm of breast: Secondary | ICD-10-CM | POA: Diagnosis not present

## 2015-01-15 DIAGNOSIS — Z113 Encounter for screening for infections with a predominantly sexual mode of transmission: Secondary | ICD-10-CM | POA: Diagnosis not present

## 2015-01-15 MED ORDER — NORETHINDRONE 0.35 MG PO TABS: 1.0000 | ORAL_TABLET | Freq: Every day | ORAL | Status: DC

## 2015-01-15 NOTE — Progress Notes (Signed)
Robin BreedJamina T Glover 11/07/1975 [id number]    History:    Presents for annual exam.  Irregular bleeding on Micronor. 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 Same partner. History of GDM on insulin. Hypertension primary care manages meds.  Past medical history, past surgical history, family history and social history were all reviewed and documented in the EPIC chart. [place] Daughter 9 and doing well. Parents diabetes, mother hypertension and breast cancer age 39 survivor.  ROS:  A ROS was performed and pertinent positives and negatives are included.  Exam:  Filed Vitals:   01/15/15 0911  BP: 124/80    General appearance:  Normal Thyroid:  Symmetrical, normal in size, without palpable masses or nodularity. Respiratory  Auscultation:  Clear without wheezing or rhonchi Cardiovascular  Auscultation:  Regular rate, without rubs, murmurs or gallops  Edema/varicosities:  Not grossly evident Abdominal  Soft,nontender, without masses, guarding or rebound.  Liver/spleen:  No organomegaly noted  Hernia:  None appreciated  Skin  Inspection:  Grossly normal   Breasts: Examined lying and sitting.     Right: Without masses, retractions, discharge or axillary adenopathy.     Left: Without masses, retractions, discharge or axillary adenopathy. Gentitourinary   Inguinal/mons:  Normal without inguinal adenopathy  External genitalia:  Normal  BUS/Urethra/Skene's glands:  Normal  Vagina:  Normal  Cervix:  Normal  Uterus:  normal in size, shape and contour.  Midline and mobile  Adnexa/parametria:     Rt: Without masses or tenderness.   Lt: Without masses or tenderness.  Anus and perineum: Normal  Digital rectal exam: Normal sphincter tone without palpated masses or tenderness  Assessment/Plan:  40 y.o. DBF G1 P1 for annual exam.  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 Has had a normal baseline. Reviewed importance of decreasing calories and increasing exercise for weight loss. Micronor prescription, proper use, importance of taking daily, no placebo week, condoms encouraged until permanent partner. CBC, lipid panel, hemoglobin A1c, TSH, UA, Pap with HR HPV typing. GC/Chlamydia, HIV, hep B, C, RPR. Instructed to print lab results and take to next primary care office appointment.   Robin ChallengerYOUNG,NANCY Glover WHNP, 10:20 AM 01/15/2015

## 2015-01-15 NOTE — Patient Instructions (Signed)
Diabetes Mellitus and Food It is important for you to manage your blood sugar (glucose) level. Your blood glucose level can be greatly affected by what you eat. Eating healthier foods in the appropriate amounts throughout the day at about the same time each day will help you control your blood glucose level. It can also help slow or prevent worsening of your diabetes mellitus. Healthy eating may even help you improve the level of your blood pressure and reach or maintain a healthy weight.  General recommendations for healthful eating and cooking habits include:  Eating meals and snacks regularly. Avoid going long periods of time without eating to lose weight.  Eating a diet that consists mainly of plant-based foods, such as fruits, vegetables, nuts, legumes, and whole grains.  Using low-heat cooking methods, such as baking, instead of high-heat cooking methods, such as deep frying. Work with your dietitian to make sure you understand how to use the Nutrition Facts information on food labels. HOW CAN FOOD AFFECT ME? Carbohydrates Carbohydrates affect your blood glucose level more than any other type of food. Your dietitian will help you determine how many carbohydrates to eat at each meal and teach you how to count carbohydrates. Counting carbohydrates is important to keep your blood glucose at a healthy level, especially if you are using insulin or taking certain medicines for diabetes mellitus. Alcohol Alcohol can cause sudden decreases in blood glucose (hypoglycemia), especially if you use insulin or take certain medicines for diabetes mellitus. Hypoglycemia can be a life-threatening condition. Symptoms of hypoglycemia (sleepiness, dizziness, and disorientation) are similar to symptoms of having too much alcohol.  If your health care provider has given you approval to drink alcohol, do so in moderation and use the following guidelines:  Women should not have more than one drink per day, and men  should not have more than two drinks per day. One drink is equal to:  12 oz of beer.  5 oz of wine.  1 oz of hard liquor.  Do not drink on an empty stomach.  Keep yourself hydrated. Have water, diet soda, or unsweetened iced tea.  Regular soda, juice, and other mixers might contain a lot of carbohydrates and should be counted. WHAT FOODS ARE NOT RECOMMENDED? As you make food choices, it is important to remember that all foods are not the same. Some foods have fewer nutrients per serving than other foods, even though they might have the same number of calories or carbohydrates. It is difficult to get your body what it needs when you eat foods with fewer nutrients. Examples of foods that you should avoid that are high in calories and carbohydrates but low in nutrients include:  Trans fats (most processed foods list trans fats on the Nutrition Facts label).  Regular soda.  Juice.  Candy.  Sweets, such as cake, pie, doughnuts, and cookies.  Fried foods. WHAT FOODS CAN I EAT? Eat nutrient-rich foods, which will nourish your body and keep you healthy. The food you should eat also will depend on several factors, including:  The calories you need.  The medicines you take.  Your weight.  Your blood glucose level.  Your blood pressure level.  Your cholesterol level. You should eat a variety of foods, including:  Protein.  Lean cuts of meat.  Proteins low in saturated fats, such as fish, egg whites, and beans. Avoid processed meats.  Fruits and vegetables.  Fruits and vegetables that may help control blood glucose levels, such as apples, mangoes, and  yams.  Dairy products.  Choose fat-free or low-fat dairy products, such as milk, yogurt, and cheese.  Grains, bread, pasta, and rice.  Choose whole grain products, such as multigrain bread, whole oats, and brown rice. These foods may help control blood pressure.  Fats.  Foods containing healthful fats, such as nuts,  avocado, olive oil, canola oil, and fish. DOES EVERYONE WITH DIABETES MELLITUS HAVE THE SAME MEAL PLAN? Because every person with diabetes mellitus is different, there is not one meal plan that works for everyone. It is very important that you meet with a dietitian who will help you create a meal plan that is just right for you.   This information is not intended to replace advice given to you by your health care provider. Make sure you discuss any questions you have with your health care provider.   Document Released: 09/26/2004 Document Revised: 01/20/2014 Document Reviewed: 11/26/2012 Elsevier Interactive Patient Education 2016 [place], Female Adopting a healthy lifestyle and getting preventive care can go a long way to promote health and wellness. Talk with your health care provider about what schedule of regular examinations is right for you. This is a good chance for you to check in with your provider about disease prevention and staying healthy. In between checkups, there are plenty of things you can do on your own. Experts have done a lot of research about which lifestyle changes and preventive measures are most likely to keep you healthy. Ask your health care provider for more information. WEIGHT AND DIET  Eat a healthy diet  Be sure to include plenty of vegetables, fruits, low-fat dairy products, and lean protein.  Do not eat a lot of foods high in solid fats, added sugars, or salt.  Get regular exercise. This is one of the most important things you can do for your health.  Most adults should exercise for at least 150 minutes each week. The exercise should increase your heart rate and make you sweat (moderate-intensity exercise).  Most adults should also do strengthening exercises at least twice a week. This is in addition to the moderate-intensity exercise.  Maintain a healthy weight  Body mass index (BMI) is a measurement that can be used to identify  possible weight problems. It estimates body fat based on height and weight. Your health care provider can help determine your BMI and help you achieve or maintain a healthy weight.  For females 39 years of age and older:   A BMI below 18.5 is considered underweight.  A BMI of 18.5 to 24.9 is normal.  A BMI of 25 to 29.9 is considered overweight.  A BMI of 30 and above is considered obese.  Watch levels of cholesterol and blood lipids  You should start having your blood tested for lipids and cholesterol at 39 years of age, then have this test every 5 years.  You may need to have your cholesterol levels checked more often if:  Your lipid or cholesterol levels are high.  You are older than 39 years of age.  You are at high risk for heart disease.  CANCER SCREENING   Lung Cancer  Lung cancer screening is recommended for adults 69-27 years old who are at high risk for lung cancer because of a history of smoking.  A yearly low-dose CT scan of the lungs is recommended for people who:  Currently smoke.  Have quit within the past 15 years.  Have at least a 30-pack-year history of smoking. A  pack year is smoking an average of one pack of cigarettes a day for 1 year.  Yearly screening should continue until it has been 15 years since you quit.  Yearly screening should stop if you develop a health problem that would prevent you from having lung cancer treatment.  Breast Cancer  Practice breast self-awareness. This means understanding how your breasts normally appear and feel.  It also means doing regular breast self-exams. Let your health care provider know about any changes, no matter how small.  If you are in your 20s or 30s, you should have a clinical breast exam (CBE) by a health care provider every 1-3 years as part of a regular health exam.  If you are 28 or older, have a CBE every year. Also consider having a breast X-ray (mammogram) every year.  If you have a family  history of breast cancer, talk to your health care provider about genetic screening.  If you are at high risk for breast cancer, talk to your health care provider about having an MRI and a mammogram every year.  Breast cancer gene (BRCA) assessment is recommended for women who have family members with BRCA-related cancers. BRCA-related cancers include:  Breast.  Ovarian.  Tubal.  Peritoneal cancers.  Results of the assessment will determine the need for genetic counseling and BRCA1 and BRCA2 testing. Cervical Cancer Your health care provider may recommend that you be screened regularly for cancer of the pelvic organs (ovaries, uterus, and vagina). This screening involves a pelvic examination, including checking for microscopic changes to the surface of your cervix (Pap test). You may be encouraged to have this screening done every 3 years, beginning at age 2.  For women ages 88-65, health care providers may recommend pelvic exams and Pap testing every 3 years, or they may recommend the Pap and pelvic exam, combined with testing for human papilloma virus (HPV), every 5 years. Some types of HPV increase your risk of cervical cancer. Testing for HPV may also be done on women of any age with unclear Pap test results.  Other health care providers may not recommend any screening for nonpregnant women who are considered low risk for pelvic cancer and who do not have symptoms. Ask your health care provider if a screening pelvic exam is right for you.  If you have had past treatment for cervical cancer or a condition that could lead to cancer, you need Pap tests and screening for cancer for at least 20 years after your treatment. If Pap tests have been discontinued, your risk factors (such as having a new sexual partner) need to be reassessed to determine if screening should resume. Some women have medical problems that increase the chance of getting cervical cancer. In these cases, your health care  provider may recommend more frequent screening and Pap tests. Colorectal Cancer  This type of cancer can be detected and often prevented.  Routine colorectal cancer screening usually begins at 39 years of age and continues through 39 years of age.  Your health care provider may recommend screening at an earlier age if you have risk factors for colon cancer.  Your health care provider may also recommend using home test kits to check for hidden blood in the stool.  A small camera at the end of a tube can be used to examine your colon directly (sigmoidoscopy or colonoscopy). This is done to check for the earliest forms of colorectal cancer.  Routine screening usually begins at age 33.  Direct  examination of the colon should be repeated every 5-10 years through 39 years of age. However, you may need to be screened more often if early forms of precancerous polyps or small growths are found. Skin Cancer  Check your skin from head to toe regularly.  Tell your health care provider about any new moles or changes in moles, especially if there is a change in a mole's shape or color.  Also tell your health care provider if you have a mole that is larger than the size of a pencil eraser.  Always use sunscreen. Apply sunscreen liberally and repeatedly throughout the day.  Protect yourself by wearing long sleeves, pants, a wide-brimmed hat, and sunglasses whenever you are outside. HEART DISEASE, DIABETES, AND HIGH BLOOD PRESSURE   High blood pressure causes heart disease and increases the risk of stroke. High blood pressure is more likely to develop in:  People who have blood pressure in the high end of the normal range (130-139/85-89 mm Hg).  People who are overweight or obese.  People who are African American.  If you are 2-50 years of age, have your blood pressure checked every 3-5 years. If you are 61 years of age or older, have your blood pressure checked every year. You should have your  blood pressure measured twice--once when you are at a hospital or clinic, and once when you are not at a hospital or clinic. Record the average of the two measurements. To check your blood pressure when you are not at a hospital or clinic, you can use:  An automated blood pressure machine at a pharmacy.  A home blood pressure monitor.  If you are between 64 years and 25 years old, ask your health care provider if you should take aspirin to prevent strokes.  Have regular diabetes screenings. This involves taking a blood sample to check your fasting blood sugar level.  If you are at a normal weight and have a low risk for diabetes, have this test once every three years after 39 years of age.  If you are overweight and have a high risk for diabetes, consider being tested at a younger age or more often. PREVENTING INFECTION  Hepatitis B  If you have a higher risk for hepatitis B, you should be screened for this virus. You are considered at high risk for hepatitis B if:  You were born in a country where hepatitis B is common. Ask your health care provider which countries are considered high risk.  Your parents were born in a high-risk country, and you have not been immunized against hepatitis B (hepatitis B vaccine).  You have HIV or AIDS.  You use needles to inject street drugs.  You live with someone who has hepatitis B.  You have had sex with someone who has hepatitis B.  You get hemodialysis treatment.  You take certain medicines for conditions, including cancer, organ transplantation, and autoimmune conditions. Hepatitis C  Blood testing is recommended for:  Everyone born from [birth year] through 1964.  Anyone with known risk factors for hepatitis C. Sexually transmitted infections (STIs)  You should be screened for sexually transmitted infections (STIs) including gonorrhea and chlamydia if:  You are sexually active and are younger than 39 years of age.  You are older than 39  years of age and your health care provider tells you that you are at risk for this type of infection.  Your sexual activity has changed since you were last screened and you are at  an increased risk for chlamydia or gonorrhea. Ask your health care provider if you are at risk.  If you do not have HIV, but are at risk, it may be recommended that you take a prescription medicine daily to prevent HIV infection. This is called pre-exposure prophylaxis (PrEP). You are considered at risk if:  You are sexually active and do not regularly use condoms or know the HIV status of your partner(s).  You take drugs by injection.  You are sexually active with a partner who has HIV. Talk with your health care provider about whether you are at high risk of being infected with HIV. If you choose to begin PrEP, you should first be tested for HIV. You should then be tested every 3 months for as long as you are taking PrEP.  PREGNANCY   If you are premenopausal and you may become pregnant, ask your health care provider about preconception counseling.  If you may become pregnant, take 400 to 800 micrograms (mcg) of folic acid every day.  If you want to prevent pregnancy, talk to your health care provider about birth control (contraception). OSTEOPOROSIS AND MENOPAUSE   Osteoporosis is a disease in which the bones lose minerals and strength with aging. This can result in serious bone fractures. Your risk for osteoporosis can be identified using a bone density scan.  If you are 94 years of age or older, or if you are at risk for osteoporosis and fractures, ask your health care provider if you should be screened.  Ask your health care provider whether you should take a calcium or vitamin D supplement to lower your risk for osteoporosis.  Menopause may have certain physical symptoms and risks.  Hormone replacement therapy may reduce some of these symptoms and risks. Talk to your health care provider about whether  hormone replacement therapy is right for you.  HOME CARE INSTRUCTIONS   Schedule regular health, dental, and eye exams.  Stay current with your immunizations.   Do not use any tobacco products including cigarettes, chewing tobacco, or electronic cigarettes.  If you are pregnant, do not drink alcohol.  If you are breastfeeding, limit how much and how often you drink alcohol.  Limit alcohol intake to no more than 1 drink per day for nonpregnant women. One drink equals 12 ounces of beer, 5 ounces of wine, or 1 ounces of hard liquor.  Do not use street drugs.  Do not share needles.  Ask your health care provider for help if you need support or information about quitting drugs.  Tell your health care provider if you often feel depressed.  Tell your health care provider if you have ever been abused or do not feel safe at home.   This information is not intended to replace advice given to you by your health care provider. Make sure you discuss any questions you have with your health care provider.   Document Released: 07/15/2010 Document Revised: 01/20/2014 Document Reviewed: 12/01/2012 Elsevier Interactive Patient Education [employer].

## 2015-01-16 LAB — CBC WITH DIFFERENTIAL/PLATELET
BASOS ABS: 0 10*3/uL (ref 0.0–0.1)
Basophils Relative: 0 % (ref 0–1)
EOS ABS: 0.1 10*3/uL (ref 0.0–0.7)
EOS PCT: 1 % (ref 0–5)
HCT: 40.4 % (ref 36.0–46.0)
Hemoglobin: 13.6 g/dL (ref 12.0–15.0)
LYMPHS ABS: 1.6 10*3/uL (ref 0.7–4.0)
Lymphocytes Relative: 31 % (ref 12–46)
MCH: 29.9 pg (ref 26.0–34.0)
MCHC: 33.7 g/dL (ref 30.0–36.0)
MCV: 88.8 fL (ref 78.0–100.0)
MONO ABS: 0.6 10*3/uL (ref 0.1–1.0)
MPV: 9.5 fL (ref 8.6–12.4)
Monocytes Relative: 12 % (ref 3–12)
Neutro Abs: 3 10*3/uL (ref 1.7–7.7)
Neutrophils Relative %: 56 % (ref 43–77)
PLATELETS: 348 10*3/uL (ref 150–400)
RBC: 4.55 MIL/uL (ref 3.87–5.11)
RDW: 14 % (ref 11.5–15.5)
WBC: 5.3 10*3/uL (ref 4.0–10.5)

## 2015-01-16 LAB — LIPID PANEL
Cholesterol: 198 mg/dL (ref 125–200)
HDL: 45 mg/dL — AB (ref >=46)
LDL Cholesterol: 132 mg/dL — ABNORMAL HIGH (ref <130)
Total CHOL/HDL Ratio: 4.4 Ratio (ref <=5.0)
Triglycerides: 105 mg/dL (ref <150)
VLDL: 21 mg/dL (ref <30)

## 2015-01-16 LAB — TSH: TSH: 2.986 u[IU]/mL (ref 0.350–4.500)

## 2015-01-17 LAB — URINALYSIS W MICROSCOPIC + REFLEX CULTURE
BACTERIA UA: NONE SEEN [HPF]
Bilirubin Urine: NEGATIVE
CRYSTALS: NONE SEEN [HPF]
Casts: NONE SEEN [LPF]
Glucose, UA: NEGATIVE
Hgb urine dipstick: NEGATIVE
Ketones, ur: NEGATIVE
Nitrite: NEGATIVE
PROTEIN: NEGATIVE
RBC / HPF: NONE SEEN RBC/HPF (ref <=2)
Specific Gravity, Urine: 1.013 (ref 1.001–1.035)
Squamous Epithelial / LPF: NONE SEEN [HPF] (ref <=5)
YEAST: NONE SEEN [HPF]
pH: 5.5 (ref 5.0–8.0)

## 2015-01-17 LAB — GC/CHLAMYDIA PROBE AMP
CT PROBE, AMP APTIMA: NOT DETECTED
GC Probe RNA: NOT DETECTED

## 2015-01-17 LAB — CYTOLOGY - PAP

## 2015-01-17 LAB — HEMOGLOBIN A1C
Hgb A1c MFr Bld: 6 % — ABNORMAL HIGH (ref <5.7)
Mean Plasma Glucose: 126 mg/dL — ABNORMAL HIGH (ref <117)

## 2015-01-19 LAB — URINE CULTURE

## 2015-01-22 ENCOUNTER — Other Ambulatory Visit: Payer: Self-pay | Admitting: Women's Health

## 2015-01-22 MED ORDER — SULFAMETHOXAZOLE-TRIMETHOPRIM 800-160 MG PO TABS: 1.0000 | ORAL_TABLET | Freq: Two times a day (BID) | ORAL | Status: DC

## 2015-07-05 ENCOUNTER — Other Ambulatory Visit: Payer: Self-pay

## 2015-07-05 DIAGNOSIS — Z3041 Encounter for surveillance of contraceptive pills: Secondary | ICD-10-CM

## 2015-07-05 MED ORDER — NORETHINDRONE 0.35 MG PO TABS
1.0000 | ORAL_TABLET | Freq: Every day | ORAL | Status: DC
Start: 2015-07-05 — End: 2016-04-09

## 2016-04-09 ENCOUNTER — Encounter: Payer: Self-pay | Admitting: Women's Health

## 2016-04-09 ENCOUNTER — Ambulatory Visit (INDEPENDENT_AMBULATORY_CARE_PROVIDER_SITE_OTHER): Payer: 59 | Admitting: Women's Health

## 2016-04-09 VITALS — BP 122/83 | Ht 66.0 in | Wt 256.0 lb

## 2016-04-09 DIAGNOSIS — Z113 Encounter for screening for infections with a predominantly sexual mode of transmission: Secondary | ICD-10-CM

## 2016-04-09 DIAGNOSIS — Z3041 Encounter for surveillance of contraceptive pills: Secondary | ICD-10-CM | POA: Diagnosis not present

## 2016-04-09 DIAGNOSIS — Z01419 Encounter for gynecological examination (general) (routine) without abnormal findings: Secondary | ICD-10-CM | POA: Diagnosis not present

## 2016-04-09 LAB — HIV ANTIBODY (ROUTINE TESTING W REFLEX): HIV: NONREACTIVE

## 2016-04-09 LAB — HEPATITIS C ANTIBODY: HCV Ab: NEGATIVE

## 2016-04-09 LAB — HEPATITIS B SURFACE ANTIGEN: Hepatitis B Surface Ag: NEGATIVE

## 2016-04-09 MED ORDER — NORETHINDRONE 0.35 MG PO TABS: 1.0000 | ORAL_TABLET | Freq: Every day | ORAL | 4 refills | Status: DC

## 2016-04-09 NOTE — Progress Notes (Signed)
Robin BreedJamina T Glover 08/20/1975 [id number]    History:    Presents for annual exam.  Regular monthly cycle /not sexually active. Had been on Micronor in the past. Hypertension and type 2 diabetes primary care manages. 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  Mother breast cancer at 6553 survivor.  Past medical history, past surgical history, family history and social history were all reviewed and documented in the EPIC chart. Working nights having difficulty with sleep. [place]. Parents diabetes. GDM with insulin. Daughter 10 doing well.  ROS:  A ROS was performed and pertinent positives and negatives are included.  Exam:  Vitals:   04/09/16 0813  BP: 122/83  Weight: 256 lb (116.1 kg)  Height: 5\' 6"  (1.676 m)   Body mass index is 41.32 kg/m.   General appearance:  Normal Thyroid:  Symmetrical, normal in size, without palpable masses or nodularity. Respiratory  Auscultation:  Clear without wheezing or rhonchi Cardiovascular  Auscultation:  Regular rate, without rubs, murmurs or gallops  Edema/varicosities:  Not grossly evident Abdominal  Soft,nontender, without masses, guarding or rebound.  Liver/spleen:  No organomegaly noted  Hernia:  None appreciated  Skin  Inspection:  Grossly normal   Breasts: Examined lying and sitting.     Right: Without masses, retractions, discharge or axillary adenopathy.     Left: Without masses, retractions, discharge or axillary adenopathy. Gentitourinary   Inguinal/mons:  Normal without inguinal adenopathy  External genitalia:  Normal  BUS/Urethra/Skene's glands:  Normal  Vagina:  Normal  Cervix:  Normal  Uterus:   normal in size, shape and contour.  Midline and mobile  Adnexa/parametria:     Rt: Without masses or tenderness.   Lt: Without masses or tenderness.  Anus and perineum: Normal  Digital rectal exam: Normal sphincter tone without palpated masses or tenderness  Assessment/Plan:  41 y.o. DBF G1  P1 for annual exam.   Regular monthly cycle STD screen Contraception management Diabetes/hypertension-primary care manages labs and meds 2015 CIN-1 normal Paps after Morbid obesity   Plan: Pap, Pap normal with negative HR HPV 2017, GC/Chlamydia, HIV, hep B, C, RPR. SBE's, annual screening mammogram, calcium rich diet, vitamin D 1000 daily encouraged. Reviewed importance of increasing exercise and decreasing calories/carbs for weight loss. Contraception options reviewed Micronor prescription, proper use, slight risk for blood clots and strokes reviewed, condoms encouraged if sexually active. Having difficulty with staying asleep/ working nights, melatonin encouraged, sleep hygiene reviewed.     Harrington ChallengerYOUNG,Robin Glover, 8:47 AM 04/09/2016

## 2016-04-09 NOTE — Patient Instructions (Signed)
Carbohydrate Counting for Diabetes Mellitus, Adult Carbohydrate counting is a method for keeping track of how many carbohydrates you eat. Eating carbohydrates naturally increases the amount of sugar (glucose) in the blood. Counting how many carbohydrates you eat helps keep your blood glucose within normal limits, which helps you manage your diabetes (diabetes mellitus). It is important to know how many carbohydrates you can safely have in each meal. This is different for every person. A diet and nutrition specialist (registered dietitian) can help you make a meal plan and calculate how many carbohydrates you should have at each meal and snack. Carbohydrates are found in the following foods:  Grains, such as breads and cereals.  Dried beans and soy products.  Starchy vegetables, such as potatoes, peas, and corn.  Fruit and fruit juices.  Milk and yogurt.  Sweets and snack foods, such as cake, cookies, candy, chips, and soft drinks. How do I count carbohydrates? There are two ways to count carbohydrates in food. You can use either of the methods or a combination of both. Reading "Nutrition Facts" on packaged food  The "Nutrition Facts" list is included on the labels of almost all packaged foods and beverages in the [place] It includes:  The serving size.  Information about nutrients in each serving, including the grams (g) of carbohydrate per serving. To use the "Nutrition Facts":  Decide how many servings you will have.  Multiply the number of servings by the number of carbohydrates per serving.  The resulting number is the total amount of carbohydrates that you will be having. Learning standard serving sizes of other foods  When you eat foods containing carbohydrates that are not packaged or do not include "Nutrition Facts" on the label, you need to measure the servings in order to count the amount of carbohydrates:  Measure the foods that you will eat with a food scale or measuring  cup, if needed.  Decide how many standard-size servings you will eat.  Multiply the number of servings by 15. Most carbohydrate-rich foods have about 15 g of carbohydrates per serving.  For example, if you eat 8 oz (170 g) of strawberries, you will have eaten 2 servings and 30 g of carbohydrates (2 servings x 15 g = 30 g).  For foods that have more than one food mixed, such as soups and casseroles, you must count the carbohydrates in each food that is included. The following list contains standard serving sizes of common carbohydrate-rich foods. Each of these servings has about 15 g of carbohydrates:   hamburger bun or  English muffin.   oz (15 mL) syrup.   oz (14 g) jelly.  1 slice of bread.  1 six-inch tortilla.  3 oz (85 g) cooked rice or pasta.  4 oz (113 g) cooked dried beans.  4 oz (113 g) starchy vegetable, such as peas, corn, or potatoes.  4 oz (113 g) hot cereal.  4 oz (113 g) mashed potatoes or  of a large baked potato.  4 oz (113 g) canned or frozen fruit.  4 oz (120 mL) fruit juice.  4-6 crackers.  6 chicken nuggets.  6 oz (170 g) unsweetened dry cereal.  6 oz (170 g) plain fat-free yogurt or yogurt sweetened with artificial sweeteners.  8 oz (240 mL) milk.  8 oz (170 g) fresh fruit or one small piece of fruit.  24 oz (680 g) popped popcorn. Example of carbohydrate counting Sample meal   3 oz (85 g) chicken breast.  6  oz (170 g) brown rice.  4 oz (113 g) corn.  8 oz (240 mL) milk.  8 oz (170 g) strawberries with sugar-free whipped topping. Carbohydrate calculation  1. Identify the foods that contain carbohydrates:  Rice.  Corn.  Milk.  Strawberries. 2. Calculate how many servings you have of each food:  2 servings rice.  1 serving corn.  1 serving milk.  1 serving strawberries. 3. Multiply each number of servings by 15 g:  2 servings rice x 15 g = 30 g.  1 serving corn x 15 g = 15 g.  1 serving milk x 15 g = 15  g.  1 serving strawberries x 15 g = 15 g. 4. Add together all of the amounts to find the total grams of carbohydrates eaten:  30 g + 15 g + 15 g + 15 g = 75 g of carbohydrates total. This information is not intended to replace advice given to you by your health care provider. Make sure you discuss any questions you have with your health care provider. Document Released: 12/30/2004 Document Revised: 07/20/2015 Document Reviewed: 06/13/2015 Elsevier Interactive Patient Education  2017 [employer] Health Maintenance, Female Adopting a healthy lifestyle and getting preventive care can go a long way to promote health and wellness. Talk with your health care provider about what schedule of regular examinations is right for you. This is a good chance for you to check in with your provider about disease prevention and staying healthy. In between checkups, there are plenty of things you can do on your own. Experts have done a lot of research about which lifestyle changes and preventive measures are most likely to keep you healthy. Ask your health care provider for more information. Weight and diet Eat a healthy diet  Be sure to include plenty of vegetables, fruits, low-fat dairy products, and lean protein.  Do not eat a lot of foods high in solid fats, added sugars, or salt.  Get regular exercise. This is one of the most important things you can do for your health.  Most adults should exercise for at least 150 minutes each week. The exercise should increase your heart rate and make you sweat (moderate-intensity exercise).  Most adults should also do strengthening exercises at least twice a week. This is in addition to the moderate-intensity exercise. Maintain a healthy weight  Body mass index (BMI) is a measurement that can be used to identify possible weight problems. It estimates body fat based on height and weight. Your health care provider can help determine your BMI and help you achieve or  maintain a healthy weight.  For females 20 years of age and older:  A BMI below 18.5 is considered underweight.  A BMI of 18.5 to 24.9 is normal.  A BMI of 25 to 29.9 is considered overweight.  A BMI of 30 and above is considered obese. Watch levels of cholesterol and blood lipids  You should start having your blood tested for lipids and cholesterol at 40 years of age, then have this test every 5 years.  You may need to have your cholesterol levels checked more often if:  Your lipid or cholesterol levels are high.  You are older than 40 years of age.  You are at high risk for heart disease. Cancer screening Lung Cancer  Lung cancer screening is recommended for adults 55-80 years old who are at high risk for lung cancer because of a history of smoking.  A yearly low-dose CT   scan of the lungs is recommended for people who:  Currently smoke.  Have quit within the past 15 years.  Have at least a 30-pack-year history of smoking. A pack year is smoking an average of one pack of cigarettes a day for 1 year.  Yearly screening should continue until it has been 15 years since you quit.  Yearly screening should stop if you develop a health problem that would prevent you from having lung cancer treatment. Breast Cancer  Practice breast self-awareness. This means understanding how your breasts normally appear and feel.  It also means doing regular breast self-exams. Let your health care provider know about any changes, no matter how small.  If you are in your 20s or 30s, you should have a clinical breast exam (CBE) by a health care provider every 1-3 years as part of a regular health exam.  If you are 40 or older, have a CBE every year. Also consider having a breast X-ray (mammogram) every year.  If you have a family history of breast cancer, talk to your health care provider about genetic screening.  If you are at high risk for breast cancer, talk to your health care provider  about having an MRI and a mammogram every year.  Breast cancer gene (BRCA) assessment is recommended for women who have family members with BRCA-related cancers. BRCA-related cancers include:  Breast.  Ovarian.  Tubal.  Peritoneal cancers.  Results of the assessment will determine the need for genetic counseling and BRCA1 and BRCA2 testing. Cervical Cancer  Your health care provider may recommend that you be screened regularly for cancer of the pelvic organs (ovaries, uterus, and vagina). This screening involves a pelvic examination, including checking for microscopic changes to the surface of your cervix (Pap test). You may be encouraged to have this screening done every 3 years, beginning at age 21.  For women ages 30-65, health care providers may recommend pelvic exams and Pap testing every 3 years, or they may recommend the Pap and pelvic exam, combined with testing for human papilloma virus (HPV), every 5 years. Some types of HPV increase your risk of cervical cancer. Testing for HPV may also be done on women of any age with unclear Pap test results.  Other health care providers may not recommend any screening for nonpregnant women who are considered low risk for pelvic cancer and who do not have symptoms. Ask your health care provider if a screening pelvic exam is right for you.  If you have had past treatment for cervical cancer or a condition that could lead to cancer, you need Pap tests and screening for cancer for at least 20 years after your treatment. If Pap tests have been discontinued, your risk factors (such as having a new sexual partner) need to be reassessed to determine if screening should resume. Some women have medical problems that increase the chance of getting cervical cancer. In these cases, your health care provider may recommend more frequent screening and Pap tests. Colorectal Cancer  This type of cancer can be detected and often prevented.  Routine colorectal  cancer screening usually begins at 40 years of age and continues through 40 years of age.  Your health care provider may recommend screening at an earlier age if you have risk factors for colon cancer.  Your health care provider may also recommend using home test kits to check for hidden blood in the stool.  A small camera at the end of a tube can be used   to examine your colon directly (sigmoidoscopy or colonoscopy). This is done to check for the earliest forms of colorectal cancer.  Routine screening usually begins at age 69.  Direct examination of the colon should be repeated every 5-10 years through 40 years of age. However, you may need to be screened more often if early forms of precancerous polyps or small growths are found. Skin Cancer  Check your skin from head to toe regularly.  Tell your health care provider about any new moles or changes in moles, especially if there is a change in a mole's shape or color.  Also tell your health care provider if you have a mole that is larger than the size of a pencil eraser.  Always use sunscreen. Apply sunscreen liberally and repeatedly throughout the day.  Protect yourself by wearing long sleeves, pants, a wide-brimmed hat, and sunglasses whenever you are outside. Heart disease, diabetes, and high blood pressure  High blood pressure causes heart disease and increases the risk of stroke. High blood pressure is more likely to develop in:  People who have blood pressure in the high end of the normal range (130-139/85-89 mm Hg).  People who are overweight or obese.  People who are African American.  If you are 6-34 years of age, have your blood pressure checked every 3-5 years. If you are 54 years of age or older, have your blood pressure checked every year. You should have your blood pressure measured twice-once when you are at a hospital or clinic, and once when you are not at a hospital or clinic. Record the average of the two  measurements. To check your blood pressure when you are not at a hospital or clinic, you can use:  An automated blood pressure machine at a pharmacy.  A home blood pressure monitor.  If you are between 36 years and 36 years old, ask your health care provider if you should take aspirin to prevent strokes.  Have regular diabetes screenings. This involves taking a blood sample to check your fasting blood sugar level.  If you are at a normal weight and have a low risk for diabetes, have this test once every three years after 40 years of age.  If you are overweight and have a high risk for diabetes, consider being tested at a younger age or more often. Preventing infection Hepatitis B  If you have a higher risk for hepatitis B, you should be screened for this virus. You are considered at high risk for hepatitis B if:  You were born in a country where hepatitis B is common. Ask your health care provider which countries are considered high risk.  Your parents were born in a high-risk country, and you have not been immunized against hepatitis B (hepatitis B vaccine).  You have HIV or AIDS.  You use needles to inject street drugs.  You live with someone who has hepatitis B.  You have had sex with someone who has hepatitis B.  You get hemodialysis treatment.  You take certain medicines for conditions, including cancer, organ transplantation, and autoimmune conditions. Hepatitis C  Blood testing is recommended for:  Everyone born from [birth year] through 1964.  Anyone with known risk factors for hepatitis C. Sexually transmitted infections (STIs)  You should be screened for sexually transmitted infections (STIs) including gonorrhea and chlamydia if:  You are sexually active and are younger than 40 years of age.  You are older than 40 years of age and your health care provider  tells you that you are at risk for this type of infection.  Your sexual activity has changed since you were last  screened and you are at an increased risk for chlamydia or gonorrhea. Ask your health care provider if you are at risk.  If you do not have HIV, but are at risk, it may be recommended that you take a prescription medicine daily to prevent HIV infection. This is called pre-exposure prophylaxis (PrEP). You are considered at risk if:  You are sexually active and do not regularly use condoms or know the HIV status of your partner(s).  You take drugs by injection.  You are sexually active with a partner who has HIV. Talk with your health care provider about whether you are at high risk of being infected with HIV. If you choose to begin PrEP, you should first be tested for HIV. You should then be tested every 3 months for as long as you are taking PrEP. Pregnancy  If you are premenopausal and you may become pregnant, ask your health care provider about preconception counseling.  If you may become pregnant, take 400 to 800 micrograms (mcg) of folic acid every day.  If you want to prevent pregnancy, talk to your health care provider about birth control (contraception). Osteoporosis and menopause  Osteoporosis is a disease in which the bones lose minerals and strength with aging. This can result in serious bone fractures. Your risk for osteoporosis can be identified using a bone density scan.  If you are 30 years of age or older, or if you are at risk for osteoporosis and fractures, ask your health care provider if you should be screened.  Ask your health care provider whether you should take a calcium or vitamin D supplement to lower your risk for osteoporosis.  Menopause may have certain physical symptoms and risks.  Hormone replacement therapy may reduce some of these symptoms and risks. Talk to your health care provider about whether hormone replacement therapy is right for you. Follow these instructions at home:  Schedule regular health, dental, and eye exams.  Stay current with your  immunizations.  Do not use any tobacco products including cigarettes, chewing tobacco, or electronic cigarettes.  If you are pregnant, do not drink alcohol.  If you are breastfeeding, limit how much and how often you drink alcohol.  Limit alcohol intake to no more than 1 drink per day for nonpregnant women. One drink equals 12 ounces of beer, 5 ounces of wine, or 1 ounces of hard liquor.  Do not use street drugs.  Do not share needles.  Ask your health care provider for help if you need support or information about quitting drugs.  Tell your health care provider if you often feel depressed.  Tell your health care provider if you have ever been abused or do not feel safe at home. This information is not intended to replace advice given to you by your health care provider. Make sure you discuss any questions you have with your health care provider. Document Released: 07/15/2010 Document Revised: 06/07/2015 Document Reviewed: 10/03/2014 Elsevier Interactive Patient Education  2017 [employer].

## 2016-04-09 NOTE — Addendum Note (Signed)
Addended by: Aura CampsWEBB, JENNIFER L on: 04/09/2016 09:02 AM   Modules accepted: Orders

## 2016-04-10 LAB — GC/CHLAMYDIA PROBE AMP
CT Probe RNA: NOT DETECTED
GC Probe RNA: NOT DETECTED

## 2016-04-10 LAB — RPR

## 2016-05-28 ENCOUNTER — Encounter: Payer: Self-pay | Admitting: Gynecology

## 2017-04-13 ENCOUNTER — Encounter: Payer: Self-pay | Admitting: Women's Health

## 2017-04-13 ENCOUNTER — Ambulatory Visit (INDEPENDENT_AMBULATORY_CARE_PROVIDER_SITE_OTHER): Payer: 59 | Admitting: Women's Health

## 2017-04-13 VITALS — BP 120/82 | Ht 66.0 in | Wt 259.2 lb

## 2017-04-13 DIAGNOSIS — Z113 Encounter for screening for infections with a predominantly sexual mode of transmission: Secondary | ICD-10-CM | POA: Diagnosis not present

## 2017-04-13 DIAGNOSIS — Z01419 Encounter for gynecological examination (general) (routine) without abnormal findings: Secondary | ICD-10-CM

## 2017-04-13 NOTE — Addendum Note (Signed)
Addended by: Aura CampsWEBB, JENNIFER L on: 04/13/2017 04:37 PM   Modules accepted: Orders

## 2017-04-13 NOTE — Progress Notes (Signed)
WILLOWDEAN LUHMANN March 20, 1975 [id number]    History:    Presents for annual exam.  Monthly cycle, condoms, not currently sexually active.   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    Negative STD screen 2018.  Normal mammogram 06/2016, mother breast cancer age 41 survivor survivor, BRCA  status unknown.  Primary care manages diabetes, hypertension.  Past medical history, past surgical history, family history and social history were all reviewed and documented in the EPIC chart.  History of GDM on insulin.  Daughter 48 doing well.  Parents diabetes, mother hypertension.  Works for [employer].  ROS:  A ROS was performed and pertinent positives and negatives are included.  Exam:  Vitals:   04/13/17 1533  BP: 120/82  Weight: 259 lb 3.2 oz (117.6 kg)  Height: _0  (1.676 m)   Body mass index is 41.84 kg/m.   General appearance:  Normal Thyroid:  Symmetrical, normal in size, without palpable masses or nodularity. Respiratory  Auscultation:  Clear without wheezing or rhonchi Cardiovascular  Auscultation:  Regular rate, without rubs, murmurs or gallops  Edema/varicosities:  Not grossly evident Abdominal  Soft,nontender, without masses, guarding or rebound.  Liver/spleen:  No organomegaly noted  Hernia:  None appreciated  Skin  Inspection:  Grossly normal   Breasts: Examined lying and sitting.     Right: Without masses, retractions, discharge or axillary adenopathy.     Left: Without masses, retractions, discharge or axillary adenopathy. Gentitourinary   Inguinal/mons:  Normal without inguinal adenopathy  External genitalia:  Normal  BUS/Urethra/Skene's glands:  Normal  Vagina:  Normal  Cervix:  Normal  Uterus:  normal in size, shape and contour.  Midline and mobile  Adnexa/parametria:     Rt: Without masses or tenderness.   Lt: Without masses or tenderness.  Anus and perineum: Normal  Digital rectal exam: Normal sphincter tone without palpated  masses or tenderness  Assessment/Plan:  42 y.o. SPF G1P1 for annual exam with no complaints.  Regular monthly cycle/condoms STD screen 2015 CIN-1, normal Paps after Hypertension, diabetes-primary care manages labs and meds Obesity  Plan: Contraception options reviewed and declines, reviewed importance of consistent condom use when active.  SBEs, annual screening mammogram due in June aware of importance.  Reviewed importance of  increasing exercise and decreasing calories/carbs for weight loss discussed.  Pap with HR HPV typing, GC/chlamydia, HIV, hep B, C, RPR.   Huel Cote [facility], 3:40 PM 04/13/2017

## 2017-04-13 NOTE — Patient Instructions (Signed)
Carbohydrate Counting for Diabetes Mellitus, Adult Carbohydrate counting is a method for keeping track of how many carbohydrates you eat. Eating carbohydrates naturally increases the amount of sugar (glucose) in the blood. Counting how many carbohydrates you eat helps keep your blood glucose within normal limits, which helps you manage your diabetes (diabetes mellitus). It is important to know how many carbohydrates you can safely have in each meal. This is different for every person. A diet and nutrition specialist (registered dietitian) can help you make a meal plan and calculate how many carbohydrates you should have at each meal and snack. Carbohydrates are found in the following foods:  Grains, such as breads and cereals.  Dried beans and soy products.  Starchy vegetables, such as potatoes, peas, and corn.  Fruit and fruit juices.  Milk and yogurt.  Sweets and snack foods, such as cake, cookies, candy, chips, and soft drinks.  How do I count carbohydrates? There are two ways to count carbohydrates in food. You can use either of the methods or a combination of both. Reading "Nutrition Facts" on packaged food The "Nutrition Facts" list is included on the labels of almost all packaged foods and beverages in the [place] It includes:  The serving size.  Information about nutrients in each serving, including the grams (g) of carbohydrate per serving.  To use the "Nutrition Facts":  Decide how many servings you will have.  Multiply the number of servings by the number of carbohydrates per serving.  The resulting number is the total amount of carbohydrates that you will be having.  Learning standard serving sizes of other foods When you eat foods containing carbohydrates that are not packaged or do not include "Nutrition Facts" on the label, you need to measure the servings in order to count the amount of carbohydrates:  Measure the foods that you will eat with a food scale or  measuring cup, if needed.  Decide how many standard-size servings you will eat.  Multiply the number of servings by 15. Most carbohydrate-rich foods have about 15 g of carbohydrates per serving. ? For example, if you eat 8 oz (170 g) of strawberries, you will have eaten 2 servings and 30 g of carbohydrates (2 servings x 15 g = 30 g).  For foods that have more than one food mixed, such as soups and casseroles, you must count the carbohydrates in each food that is included.  The following list contains standard serving sizes of common carbohydrate-rich foods. Each of these servings has about 15 g of carbohydrates:   hamburger bun or  English muffin.   oz (15 mL) syrup.   oz (14 g) jelly.  1 slice of bread.  1 six-inch tortilla.  3 oz (85 g) cooked rice or pasta.  4 oz (113 g) cooked dried beans.  4 oz (113 g) starchy vegetable, such as peas, corn, or potatoes.  4 oz (113 g) hot cereal.  4 oz (113 g) mashed potatoes or  of a large baked potato.  4 oz (113 g) canned or frozen fruit.  4 oz (120 mL) fruit juice.  4-6 crackers.  6 chicken nuggets.  6 oz (170 g) unsweetened dry cereal.  6 oz (170 g) plain fat-free yogurt or yogurt sweetened with artificial sweeteners.  8 oz (240 mL) milk.  8 oz (170 g) fresh fruit or one small piece of fruit.  24 oz (680 g) popped popcorn.  Example of carbohydrate counting Sample meal  3 oz (85 g) chicken breast.    6 oz (170 g) brown rice.  4 oz (113 g) corn.  8 oz (240 mL) milk.  8 oz (170 g) strawberries with sugar-free whipped topping. Carbohydrate calculation 1. Identify the foods that contain carbohydrates: ? Rice. ? Corn. ? Milk. ? Strawberries. 2. Calculate how many servings you have of each food: ? 2 servings rice. ? 1 serving corn. ? 1 serving milk. ? 1 serving strawberries. 3. Multiply each number of servings by 15 g: ? 2 servings rice x 15 g = 30 g. ? 1 serving corn x 15 g = 15 g. ? 1 serving milk x 15  g = 15 g. ? 1 serving strawberries x 15 g = 15 g. 4. Add together all of the amounts to find the total grams of carbohydrates eaten: ? 30 g + 15 g + 15 g + 15 g = 75 g of carbohydrates total. This information is not intended to replace advice given to you by your health care provider. Make sure you discuss any questions you have with your health care provider. Document Released: 12/30/2004 Document Revised: 07/20/2015 Document Reviewed: 06/13/2015 Elsevier Interactive Patient Education  2018 [place] Maintenance, Female Adopting a healthy lifestyle and getting preventive care can go a long way to promote health and wellness. Talk with your health care provider about what schedule of regular examinations is right for you. This is a good chance for you to check in with your provider about disease prevention and staying healthy. In between checkups, there are plenty of things you can do on your own. Experts have done a lot of research about which lifestyle changes and preventive measures are most likely to keep you healthy. Ask your health care provider for more information. Weight and diet Eat a healthy diet  Be sure to include plenty of vegetables, fruits, low-fat dairy products, and lean protein.  Do not eat a lot of foods high in solid fats, added sugars, or salt.  Get regular exercise. This is one of the most important things you can do for your health. ? Most adults should exercise for at least 150 minutes each week. The exercise should increase your heart rate and make you sweat (moderate-intensity exercise). ? Most adults should also do strengthening exercises at least twice a week. This is in addition to the moderate-intensity exercise.  Maintain a healthy weight  Body mass index (BMI) is a measurement that can be used to identify possible weight problems. It estimates body fat based on height and weight. Your health care provider can help determine your BMI and help you  achieve or maintain a healthy weight.  For females 76 years of age and older: ? A BMI below 18.5 is considered underweight. ? A BMI of 18.5 to 24.9 is normal. ? A BMI of 25 to 29.9 is considered overweight. ? A BMI of 30 and above is considered obese.  Watch levels of cholesterol and blood lipids  You should start having your blood tested for lipids and cholesterol at 41 years of age, then have this test every 5 years.  You may need to have your cholesterol levels checked more often if: ? Your lipid or cholesterol levels are high. ? You are older than 41 years of age. ? You are at high risk for heart disease.  Cancer screening Lung Cancer  Lung cancer screening is recommended for adults 52-25 years old who are at high risk for lung cancer because of a history of smoking.  A  yearly low-dose CT scan of the lungs is recommended for people who: ? Currently smoke. ? Have quit within the past 15 years. ? Have at least a 30-pack-year history of smoking. A pack year is smoking an average of one pack of cigarettes a day for 1 year.  Yearly screening should continue until it has been 15 years since you quit.  Yearly screening should stop if you develop a health problem that would prevent you from having lung cancer treatment.  Breast Cancer  Practice breast self-awareness. This means understanding how your breasts normally appear and feel.  It also means doing regular breast self-exams. Let your health care provider know about any changes, no matter how small.  If you are in your 20s or 30s, you should have a clinical breast exam (CBE) by a health care provider every 1-3 years as part of a regular health exam.  If you are 4 or older, have a CBE every year. Also consider having a breast X-ray (mammogram) every year.  If you have a family history of breast cancer, talk to your health care provider about genetic screening.  If you are at high risk for breast cancer, talk to your health  care provider about having an MRI and a mammogram every year.  Breast cancer gene (BRCA) assessment is recommended for women who have family members with BRCA-related cancers. BRCA-related cancers include: ? Breast. ? Ovarian. ? Tubal. ? Peritoneal cancers.  Results of the assessment will determine the need for genetic counseling and BRCA1 and BRCA2 testing.  Cervical Cancer Your health care provider may recommend that you be screened regularly for cancer of the pelvic organs (ovaries, uterus, and vagina). This screening involves a pelvic examination, including checking for microscopic changes to the surface of your cervix (Pap test). You may be encouraged to have this screening done every 3 years, beginning at age 12.  For women ages 13-65, health care providers may recommend pelvic exams and Pap testing every 3 years, or they may recommend the Pap and pelvic exam, combined with testing for human papilloma virus (HPV), every 5 years. Some types of HPV increase your risk of cervical cancer. Testing for HPV may also be done on women of any age with unclear Pap test results.  Other health care providers may not recommend any screening for nonpregnant women who are considered low risk for pelvic cancer and who do not have symptoms. Ask your health care provider if a screening pelvic exam is right for you.  If you have had past treatment for cervical cancer or a condition that could lead to cancer, you need Pap tests and screening for cancer for at least 20 years after your treatment. If Pap tests have been discontinued, your risk factors (such as having a new sexual partner) need to be reassessed to determine if screening should resume. Some women have medical problems that increase the chance of getting cervical cancer. In these cases, your health care provider may recommend more frequent screening and Pap tests.  Colorectal Cancer  This type of cancer can be detected and often  prevented.  Routine colorectal cancer screening usually begins at 41 years of age and continues through 41 years of age.  Your health care provider may recommend screening at an earlier age if you have risk factors for colon cancer.  Your health care provider may also recommend using home test kits to check for hidden blood in the stool.  A small camera at the end of  a tube can be used to examine your colon directly (sigmoidoscopy or colonoscopy). This is done to check for the earliest forms of colorectal cancer.  Routine screening usually begins at age 45.  Direct examination of the colon should be repeated every 5-10 years through 41 years of age. However, you may need to be screened more often if early forms of precancerous polyps or small growths are found.  Skin Cancer  Check your skin from head to toe regularly.  Tell your health care provider about any new moles or changes in moles, especially if there is a change in a mole's shape or color.  Also tell your health care provider if you have a mole that is larger than the size of a pencil eraser.  Always use sunscreen. Apply sunscreen liberally and repeatedly throughout the day.  Protect yourself by wearing long sleeves, pants, a wide-brimmed hat, and sunglasses whenever you are outside.  Heart disease, diabetes, and high blood pressure  High blood pressure causes heart disease and increases the risk of stroke. High blood pressure is more likely to develop in: ? People who have blood pressure in the high end of the normal range (130-139/85-89 mm Hg). ? People who are overweight or obese. ? People who are African American.  If you are 65-44 years of age, have your blood pressure checked every 3-5 years. If you are 35 years of age or older, have your blood pressure checked every year. You should have your blood pressure measured twice-once when you are at a hospital or clinic, and once when you are not at a hospital or clinic.  Record the average of the two measurements. To check your blood pressure when you are not at a hospital or clinic, you can use: ? An automated blood pressure machine at a pharmacy. ? A home blood pressure monitor.  If you are between 55 years and 4 years old, ask your health care provider if you should take aspirin to prevent strokes.  Have regular diabetes screenings. This involves taking a blood sample to check your fasting blood sugar level. ? If you are at a normal weight and have a low risk for diabetes, have this test once every three years after 41 years of age. ? If you are overweight and have a high risk for diabetes, consider being tested at a younger age or more often. Preventing infection Hepatitis B  If you have a higher risk for hepatitis B, you should be screened for this virus. You are considered at high risk for hepatitis B if: ? You were born in a country where hepatitis B is common. Ask your health care provider which countries are considered high risk. ? Your parents were born in a high-risk country, and you have not been immunized against hepatitis B (hepatitis B vaccine). ? You have HIV or AIDS. ? You use needles to inject street drugs. ? You live with someone who has hepatitis B. ? You have had sex with someone who has hepatitis B. ? You get hemodialysis treatment. ? You take certain medicines for conditions, including cancer, organ transplantation, and autoimmune conditions.  Hepatitis C  Blood testing is recommended for: ? Everyone born from [birth year] through 1964. ? Anyone with known risk factors for hepatitis C.  Sexually transmitted infections (STIs)  You should be screened for sexually transmitted infections (STIs) including gonorrhea and chlamydia if: ? You are sexually active and are younger than 41 years of age. ? You are older than  41 years of age and your health care provider tells you that you are at risk for this type of infection. ? Your sexual  activity has changed since you were last screened and you are at an increased risk for chlamydia or gonorrhea. Ask your health care provider if you are at risk.  If you do not have HIV, but are at risk, it may be recommended that you take a prescription medicine daily to prevent HIV infection. This is called pre-exposure prophylaxis (PrEP). You are considered at risk if: ? You are sexually active and do not regularly use condoms or know the HIV status of your partner(s). ? You take drugs by injection. ? You are sexually active with a partner who has HIV.  Talk with your health care provider about whether you are at high risk of being infected with HIV. If you choose to begin PrEP, you should first be tested for HIV. You should then be tested every 3 months for as long as you are taking PrEP. Pregnancy  If you are premenopausal and you may become pregnant, ask your health care provider about preconception counseling.  If you may become pregnant, take 400 to 800 micrograms (mcg) of folic acid every day.  If you want to prevent pregnancy, talk to your health care provider about birth control (contraception). Osteoporosis and menopause  Osteoporosis is a disease in which the bones lose minerals and strength with aging. This can result in serious bone fractures. Your risk for osteoporosis can be identified using a bone density scan.  If you are 29 years of age or older, or if you are at risk for osteoporosis and fractures, ask your health care provider if you should be screened.  Ask your health care provider whether you should take a calcium or vitamin D supplement to lower your risk for osteoporosis.  Menopause may have certain physical symptoms and risks.  Hormone replacement therapy may reduce some of these symptoms and risks. Talk to your health care provider about whether hormone replacement therapy is right for you. Follow these instructions at home:  Schedule regular health, dental,  and eye exams.  Stay current with your immunizations.  Do not use any tobacco products including cigarettes, chewing tobacco, or electronic cigarettes.  If you are pregnant, do not drink alcohol.  If you are breastfeeding, limit how much and how often you drink alcohol.  Limit alcohol intake to no more than 1 drink per day for nonpregnant women. One drink equals 12 ounces of beer, 5 ounces of wine, or 1 ounces of hard liquor.  Do not use street drugs.  Do not share needles.  Ask your health care provider for help if you need support or information about quitting drugs.  Tell your health care provider if you often feel depressed.  Tell your health care provider if you have ever been abused or do not feel safe at home. This information is not intended to replace advice given to you by your health care provider. Make sure you discuss any questions you have with your health care provider. Document Released: 07/15/2010 Document Revised: 06/07/2015 Document Reviewed: 10/03/2014 Elsevier Interactive Patient Education  [employer].

## 2017-04-14 LAB — PAP, TP IMAGING W/ HPV RNA, RFLX HPV TYPE 16,18/45: HPV DNA HIGH RISK: NOT DETECTED

## 2017-04-14 LAB — RPR: RPR Ser Ql: NONREACTIVE

## 2017-04-14 LAB — C. TRACHOMATIS/N. GONORRHOEAE RNA
C. trachomatis RNA, TMA: NOT DETECTED
N. GONORRHOEAE RNA, TMA: NOT DETECTED

## 2017-04-14 LAB — HIV ANTIBODY (ROUTINE TESTING W REFLEX): HIV 1&2 Ab, 4th Generation: NONREACTIVE

## 2017-04-14 LAB — HEPATITIS C ANTIBODY
Hepatitis C Ab: NONREACTIVE
SIGNAL TO CUT-OFF: 0.02 (ref <1.00)

## 2017-04-14 LAB — HEPATITIS B SURFACE ANTIGEN: Hepatitis B Surface Ag: NONREACTIVE

## 2017-05-25 ENCOUNTER — Other Ambulatory Visit: Payer: Self-pay

## 2017-05-25 MED ORDER — NORETHINDRONE 0.35 MG PO TABS: 1.0000 | ORAL_TABLET | Freq: Every day | ORAL | 4 refills | Status: DC

## 2017-05-25 NOTE — Telephone Encounter (Signed)
Robin Glover, patient has been on this in the past. Was not prescribed this year at annual because you noted "contraception options were declined".Marland Kitchen

## 2017-05-25 NOTE — Telephone Encounter (Signed)
Okay, have her start with next cycle take daily okay for refill.

## 2017-05-26 ENCOUNTER — Other Ambulatory Visit: Payer: Self-pay

## 2017-05-26 MED ORDER — NORETHINDRONE 0.35 MG PO TABS: 1.0000 | ORAL_TABLET | Freq: Every day | ORAL | 4 refills | Status: DC

## 2017-06-22 ENCOUNTER — Encounter: Payer: Self-pay | Admitting: Women's Health

## 2017-06-22 ENCOUNTER — Ambulatory Visit: Payer: 59 | Admitting: Women's Health

## 2017-06-22 VITALS — BP 120/82

## 2017-06-22 DIAGNOSIS — B379 Candidiasis, unspecified: Secondary | ICD-10-CM

## 2017-06-22 DIAGNOSIS — N898 Other specified noninflammatory disorders of vagina: Secondary | ICD-10-CM | POA: Diagnosis not present

## 2017-06-22 LAB — WET PREP FOR TRICH, YEAST, CLUE

## 2017-06-22 MED ORDER — FLUCONAZOLE 150 MG PO TABS: 150.0000 mg | ORAL_TABLET | Freq: Once | ORAL | 1 refills | Status: AC

## 2017-06-22 MED ORDER — NYSTATIN-TRIAMCINOLONE 100000-0.1 UNIT/GM-% EX OINT: 1.0000 "application " | TOPICAL_OINTMENT | Freq: Two times a day (BID) | CUTANEOUS | 1 refills | Status: DC

## 2017-06-22 NOTE — Patient Instructions (Addendum)
Vaginal Yeast infection, Adult Vaginal yeast infection is a condition that causes soreness, swelling, and redness (inflammation) of the vagina. It also causes vaginal discharge. This is a common condition. Some women get this infection frequently. What are the causes? This condition is caused by a change in the normal balance of the yeast (candida) and bacteria that live in the vagina. This change causes an overgrowth of yeast, which causes the inflammation. What increases the risk? This condition is more likely to develop in:  Women who take antibiotic medicines.  Women who have diabetes.  Women who take birth control pills.  Women who are pregnant.  Women who douche often.  Women who have a weak defense (immune) system.  Women who have been taking steroid medicines for a long time.  Women who frequently wear tight clothing.  What are the signs or symptoms? Symptoms of this condition include:  White, thick vaginal discharge.  Swelling, itching, redness, and irritation of the vagina. The lips of the vagina (vulva) may be affected as well.  Pain or a burning feeling while urinating.  Pain during sex.  How is this diagnosed? This condition is diagnosed with a medical history and physical exam. This will include a pelvic exam. Your health care provider will examine a sample of your vaginal discharge under a microscope. Your health care provider may send this sample for testing to confirm the diagnosis. How is this treated? This condition is treated with medicine. Medicines may be over-the-counter or prescription. You may be told to use one or more of the following:  Medicine that is taken orally.  Medicine that is applied as a cream.  Medicine that is inserted directly into the vagina (suppository).  Follow these instructions at home:  Take or apply over-the-counter and prescription medicines only as told by your health care provider.  Do not have sex until your health  care provider has approved. Tell your sex partner that you have a yeast infection. That person should go to his or her health care provider if he or she develops symptoms.  Do not wear tight clothes, such as pantyhose or tight pants.  Avoid using tampons until your health care provider approves.  Eat more yogurt. This may help to keep your yeast infection from returning.  Try taking a sitz bath to help with discomfort. This is a warm water bath that is taken while you are sitting down. The water should only come up to your hips and should cover your buttocks. Do this 3-4 times per day or as told by your health care provider.  Do not douche.  Wear breathable, cotton underwear.  If you have diabetes, keep your blood sugar levels under control. Contact a health care provider if:  You have a fever.  Your symptoms go away and then return.  Your symptoms do not get better with treatment.  Your symptoms get worse.  You have new symptoms.  You develop blisters in or around your vagina.  You have blood coming from your vagina and it is not your menstrual period.  You develop pain in your abdomen. This information is not intended to replace advice given to you by your health care provider. Make sure you discuss any questions you have with your health care provider. Document Released: 10/09/2004 Document Revised: 06/13/2015 Document Reviewed: 07/03/2014 Elsevier Interactive Patient Education  2018 [place] Maintenance, Female Adopting a healthy lifestyle and getting preventive care can go a long way to promote health and wellness.  Talk with your health care provider about what schedule of regular examinations is right for you. This is a good chance for you to check in with your provider about disease prevention and staying healthy. In between checkups, there are plenty of things you can do on your own. Experts have done a lot of research about which lifestyle changes and  preventive measures are most likely to keep you healthy. Ask your health care provider for more information. Weight and diet Eat a healthy diet  Be sure to include plenty of vegetables, fruits, low-fat dairy products, and lean protein.  Do not eat a lot of foods high in solid fats, added sugars, or salt.  Get regular exercise. This is one of the most important things you can do for your health. ? Most adults should exercise for at least 150 minutes each week. The exercise should increase your heart rate and make you sweat (moderate-intensity exercise). ? Most adults should also do strengthening exercises at least twice a week. This is in addition to the moderate-intensity exercise.  Maintain a healthy weight  Body mass index (BMI) is a measurement that can be used to identify possible weight problems. It estimates body fat based on height and weight. Your health care provider can help determine your BMI and help you achieve or maintain a healthy weight.  For females 64 years of age and older: ? A BMI below 18.5 is considered underweight. ? A BMI of 18.5 to 24.9 is normal. ? A BMI of 25 to 29.9 is considered overweight. ? A BMI of 30 and above is considered obese.  Watch levels of cholesterol and blood lipids  You should start having your blood tested for lipids and cholesterol at 41 years of age, then have this test every 5 years.  You may need to have your cholesterol levels checked more often if: ? Your lipid or cholesterol levels are high. ? You are older than 41 years of age. ? You are at high risk for heart disease.  Cancer screening Lung Cancer  Lung cancer screening is recommended for adults 69-70 years old who are at high risk for lung cancer because of a history of smoking.  A yearly low-dose CT scan of the lungs is recommended for people who: ? Currently smoke. ? Have quit within the past 15 years. ? Have at least a 30-pack-year history of smoking. A pack year is  smoking an average of one pack of cigarettes a day for 1 year.  Yearly screening should continue until it has been 15 years since you quit.  Yearly screening should stop if you develop a health problem that would prevent you from having lung cancer treatment.  Breast Cancer  Practice breast self-awareness. This means understanding how your breasts normally appear and feel.  It also means doing regular breast self-exams. Let your health care provider know about any changes, no matter how small.  If you are in your 20s or 30s, you should have a clinical breast exam (CBE) by a health care provider every 1-3 years as part of a regular health exam.  If you are 5 or older, have a CBE every year. Also consider having a breast X-ray (mammogram) every year.  If you have a family history of breast cancer, talk to your health care provider about genetic screening.  If you are at high risk for breast cancer, talk to your health care provider about having an MRI and a mammogram every year.  Breast  cancer gene (BRCA) assessment is recommended for women who have family members with BRCA-related cancers. BRCA-related cancers include: ? Breast. ? Ovarian. ? Tubal. ? Peritoneal cancers.  Results of the assessment will determine the need for genetic counseling and BRCA1 and BRCA2 testing.  Cervical Cancer Your health care provider may recommend that you be screened regularly for cancer of the pelvic organs (ovaries, uterus, and vagina). This screening involves a pelvic examination, including checking for microscopic changes to the surface of your cervix (Pap test). You may be encouraged to have this screening done every 3 years, beginning at age 50.  For women ages 52-65, health care providers may recommend pelvic exams and Pap testing every 3 years, or they may recommend the Pap and pelvic exam, combined with testing for human papilloma virus (HPV), every 5 years. Some types of HPV increase your risk  of cervical cancer. Testing for HPV may also be done on women of any age with unclear Pap test results.  Other health care providers may not recommend any screening for nonpregnant women who are considered low risk for pelvic cancer and who do not have symptoms. Ask your health care provider if a screening pelvic exam is right for you.  If you have had past treatment for cervical cancer or a condition that could lead to cancer, you need Pap tests and screening for cancer for at least 20 years after your treatment. If Pap tests have been discontinued, your risk factors (such as having a new sexual partner) need to be reassessed to determine if screening should resume. Some women have medical problems that increase the chance of getting cervical cancer. In these cases, your health care provider may recommend more frequent screening and Pap tests.  Colorectal Cancer  This type of cancer can be detected and often prevented.  Routine colorectal cancer screening usually begins at 41 years of age and continues through 41 years of age.  Your health care provider may recommend screening at an earlier age if you have risk factors for colon cancer.  Your health care provider may also recommend using home test kits to check for hidden blood in the stool.  A small camera at the end of a tube can be used to examine your colon directly (sigmoidoscopy or colonoscopy). This is done to check for the earliest forms of colorectal cancer.  Routine screening usually begins at age 85.  Direct examination of the colon should be repeated every 5-10 years through 41 years of age. However, you may need to be screened more often if early forms of precancerous polyps or small growths are found.  Skin Cancer  Check your skin from head to toe regularly.  Tell your health care provider about any new moles or changes in moles, especially if there is a change in a mole's shape or color.  Also tell your health care  provider if you have a mole that is larger than the size of a pencil eraser.  Always use sunscreen. Apply sunscreen liberally and repeatedly throughout the day.  Protect yourself by wearing long sleeves, pants, a wide-brimmed hat, and sunglasses whenever you are outside.  Heart disease, diabetes, and high blood pressure  High blood pressure causes heart disease and increases the risk of stroke. High blood pressure is more likely to develop in: ? People who have blood pressure in the high end of the normal range (130-139/85-89 mm Hg). ? People who are overweight or obese. ? People who are African American.  If you are 80-71 years of age, have your blood pressure checked every 3-5 years. If you are 66 years of age or older, have your blood pressure checked every year. You should have your blood pressure measured twice-once when you are at a hospital or clinic, and once when you are not at a hospital or clinic. Record the average of the two measurements. To check your blood pressure when you are not at a hospital or clinic, you can use: ? An automated blood pressure machine at a pharmacy. ? A home blood pressure monitor.  If you are between 92 years and 42 years old, ask your health care provider if you should take aspirin to prevent strokes.  Have regular diabetes screenings. This involves taking a blood sample to check your fasting blood sugar level. ? If you are at a normal weight and have a low risk for diabetes, have this test once every three years after 41 years of age. ? If you are overweight and have a high risk for diabetes, consider being tested at a younger age or more often. Preventing infection Hepatitis B  If you have a higher risk for hepatitis B, you should be screened for this virus. You are considered at high risk for hepatitis B if: ? You were born in a country where hepatitis B is common. Ask your health care provider which countries are considered high risk. ? Your  parents were born in a high-risk country, and you have not been immunized against hepatitis B (hepatitis B vaccine). ? You have HIV or AIDS. ? You use needles to inject street drugs. ? You live with someone who has hepatitis B. ? You have had sex with someone who has hepatitis B. ? You get hemodialysis treatment. ? You take certain medicines for conditions, including cancer, organ transplantation, and autoimmune conditions.  Hepatitis C  Blood testing is recommended for: ? Everyone born from [birth year] through 1964. ? Anyone with known risk factors for hepatitis C.  Sexually transmitted infections (STIs)  You should be screened for sexually transmitted infections (STIs) including gonorrhea and chlamydia if: ? You are sexually active and are younger than 41 years of age. ? You are older than 41 years of age and your health care provider tells you that you are at risk for this type of infection. ? Your sexual activity has changed since you were last screened and you are at an increased risk for chlamydia or gonorrhea. Ask your health care provider if you are at risk.  If you do not have HIV, but are at risk, it may be recommended that you take a prescription medicine daily to prevent HIV infection. This is called pre-exposure prophylaxis (PrEP). You are considered at risk if: ? You are sexually active and do not regularly use condoms or know the HIV status of your partner(s). ? You take drugs by injection. ? You are sexually active with a partner who has HIV.  Talk with your health care provider about whether you are at high risk of being infected with HIV. If you choose to begin PrEP, you should first be tested for HIV. You should then be tested every 3 months for as long as you are taking PrEP. Pregnancy  If you are premenopausal and you may become pregnant, ask your health care provider about preconception counseling.  If you may become pregnant, take 400 to 800 micrograms (mcg) of folic  acid every day.  If you want to prevent pregnancy, talk  to your health care provider about birth control (contraception). Osteoporosis and menopause  Osteoporosis is a disease in which the bones lose minerals and strength with aging. This can result in serious bone fractures. Your risk for osteoporosis can be identified using a bone density scan.  If you are 34 years of age or older, or if you are at risk for osteoporosis and fractures, ask your health care provider if you should be screened.  Ask your health care provider whether you should take a calcium or vitamin D supplement to lower your risk for osteoporosis.  Menopause may have certain physical symptoms and risks.  Hormone replacement therapy may reduce some of these symptoms and risks. Talk to your health care provider about whether hormone replacement therapy is right for you. Follow these instructions at home:  Schedule regular health, dental, and eye exams.  Stay current with your immunizations.  Do not use any tobacco products including cigarettes, chewing tobacco, or electronic cigarettes.  If you are pregnant, do not drink alcohol.  If you are breastfeeding, limit how much and how often you drink alcohol.  Limit alcohol intake to no more than 1 drink per day for nonpregnant women. One drink equals 12 ounces of beer, 5 ounces of wine, or 1 ounces of hard liquor.  Do not use street drugs.  Do not share needles.  Ask your health care provider for help if you need support or information about quitting drugs.  Tell your health care provider if you often feel depressed.  Tell your health care provider if you have ever been abused or do not feel safe at home. This information is not intended to replace advice given to you by your health care provider. Make sure you discuss any questions you have with your health care provider. Document Released: 07/15/2010 Document Revised: 06/07/2015 Document Reviewed:  10/03/2014 Elsevier Interactive Patient Education  [employer].

## 2017-06-22 NOTE — Progress Notes (Signed)
Robin Glover 01/07/1976 [id number]  History: 42 y.o. SBF G1P1 presents with constant vaginal irritation, itchiness, and slight clear/yellow vaginal discharge. When looking at her perineum with a mirror, she noticed dry patches bilaterally around her outer labia. Switched her soap from dove to dye free feminine wash, but it has not helped. History of diabetes, hypertension, and obesity managed by primary care physician.  Negative STD screen 04/2017 has not been sexually active since.   ROS:  A ROS was performed and pertinent positives and negatives are included.  Exam:  Vitals:   06/22/17 1528  BP: 120/82    General appearance:  Normal, Alert and oriented, well-developed, well-nourished Gentitourinary   Inguinal/mons:  Normal without inguinal adenopathy  External genitalia:  Normal  BUS/Urethra/Skene's glands:  Normal  Vagina: Erythema, wet prep positive for yeast  Cervix:  Normal  Uterus:  normal in size, shape and contour.  Midline and mobile  Adnexa/parametria:     Rt: Without masses or tenderness.   Lt: Without masses or tenderness.  Anus and perineum: Normal  Assessment/Plan: 42 y.o. SBF G1P1   Candida of the perineum/vagina Regular monthly cycle/condoms Hypertension, diabetes-primary care manages labs and meds Obesity  1) Diflucan 150 p.o. x1 dose with refill.  Mycolog cream externally 1-2 times daily as needed for symptomatic relief of itching.  Educated patient about vaginal yeast infections such as causes, signs and symptoms. Encouraged patient to avoid tight clothing, keep the area dry and clean, and encouraged weight loss.  2)  Reviewed importance of  increasing exercise and decreasing calories/carbs for weight loss discussed.  3) Contraception options reviewed and declines, reviewed importance of consistent condom use when active.    Harrington Challengerancy J Casten Floren [facility] HospitalWHNP, 4:20 PM 06/22/2017

## 2018-04-15 ENCOUNTER — Other Ambulatory Visit: Payer: Self-pay

## 2018-04-19 ENCOUNTER — Ambulatory Visit (INDEPENDENT_AMBULATORY_CARE_PROVIDER_SITE_OTHER): Payer: 59 | Admitting: Women's Health

## 2018-04-19 ENCOUNTER — Other Ambulatory Visit: Payer: Self-pay

## 2018-04-19 ENCOUNTER — Encounter: Payer: Self-pay | Admitting: Women's Health

## 2018-04-19 VITALS — BP 132/80 | Ht 65.0 in | Wt 269.0 lb

## 2018-04-19 DIAGNOSIS — Z113 Encounter for screening for infections with a predominantly sexual mode of transmission: Secondary | ICD-10-CM

## 2018-04-19 DIAGNOSIS — E7800 Pure hypercholesterolemia, unspecified: Secondary | ICD-10-CM | POA: Insufficient documentation

## 2018-04-19 DIAGNOSIS — E119 Type 2 diabetes mellitus without complications: Secondary | ICD-10-CM | POA: Insufficient documentation

## 2018-04-19 DIAGNOSIS — Z01419 Encounter for gynecological examination (general) (routine) without abnormal findings: Secondary | ICD-10-CM | POA: Diagnosis not present

## 2018-04-19 MED ORDER — NORETHINDRONE 0.35 MG PO TABS: 1.0000 | ORAL_TABLET | Freq: Every day | ORAL | 4 refills | Status: DC

## 2018-04-19 MED ORDER — FLUCONAZOLE 150 MG PO TABS: 150.0000 mg | ORAL_TABLET | Freq: Once | ORAL | 0 refills | Status: AC

## 2018-04-19 NOTE — Patient Instructions (Addendum)
Health Maintenance, Female Adopting a healthy lifestyle and getting preventive care can go a long way to promote health and wellness. Talk with your health care provider about what schedule of regular examinations is right for you. This is a good chance for you to check in with your provider about disease prevention and staying healthy. In between checkups, there are plenty of things you can do on your own. Experts have done a lot of research about which lifestyle changes and preventive measures are most likely to keep you healthy. Ask your health care provider for more information. Weight and diet Eat a healthy diet  Be sure to include plenty of vegetables, fruits, low-fat dairy products, and lean protein.  Do not eat a lot of foods high in solid fats, added sugars, or salt.  Get regular exercise. This is one of the most important things you can do for your health. ? Most adults should exercise for at least 150 minutes each week. The exercise should increase your heart rate and make you sweat (moderate-intensity exercise). ? Most adults should also do strengthening exercises at least twice a week. This is in addition to the moderate-intensity exercise. Maintain a healthy weight  Body mass index (BMI) is a measurement that can be used to identify possible weight problems. It estimates body fat based on height and weight. Your health care provider can help determine your BMI and help you achieve or maintain a healthy weight.  For females 20 years of age and older: ? A BMI below 18.5 is considered underweight. ? A BMI of 18.5 to 24.9 is normal. ? A BMI of 25 to 29.9 is considered overweight. ? A BMI of 30 and above is considered obese. Watch levels of cholesterol and blood lipids  You should start having your blood tested for lipids and cholesterol at 42 years of age, then have this test every 5 years.  You may need to have your cholesterol levels checked more often if: ? Your lipid or  cholesterol levels are high. ? You are older than 42 years of age. ? You are at high risk for heart disease. Cancer screening Lung Cancer  Lung cancer screening is recommended for adults 55-80 years old who are at high risk for lung cancer because of a history of smoking.  A yearly low-dose CT scan of the lungs is recommended for people who: ? Currently smoke. ? Have quit within the past 15 years. ? Have at least a 30-pack-year history of smoking. A pack year is smoking an average of one pack of cigarettes a day for 1 year.  Yearly screening should continue until it has been 15 years since you quit.  Yearly screening should stop if you develop a health problem that would prevent you from having lung cancer treatment. Breast Cancer  Practice breast self-awareness. This means understanding how your breasts normally appear and feel.  It also means doing regular breast self-exams. Let your health care provider know about any changes, no matter how small.  If you are in your 20s or 30s, you should have a clinical breast exam (CBE) by a health care provider every 1-3 years as part of a regular health exam.  If you are 40 or older, have a CBE every year. Also consider having a breast X-ray (mammogram) every year.  If you have a family history of breast cancer, talk to your health care provider about genetic screening.  If you are at high risk for breast cancer, talk   to your health care provider about having an MRI and a mammogram every year.  Breast cancer gene (BRCA) assessment is recommended for women who have family members with BRCA-related cancers. BRCA-related cancers include: ? Breast. ? Ovarian. ? Tubal. ? Peritoneal cancers.  Results of the assessment will determine the need for genetic counseling and BRCA1 and BRCA2 testing. Cervical Cancer Your health care provider may recommend that you be screened regularly for cancer of the pelvic organs (ovaries, uterus, and vagina).  This screening involves a pelvic examination, including checking for microscopic changes to the surface of your cervix (Pap test). You may be encouraged to have this screening done every 3 years, beginning at age 21.  For women ages 30-65, health care providers may recommend pelvic exams and Pap testing every 3 years, or they may recommend the Pap and pelvic exam, combined with testing for human papilloma virus (HPV), every 5 years. Some types of HPV increase your risk of cervical cancer. Testing for HPV may also be done on women of any age with unclear Pap test results.  Other health care providers may not recommend any screening for nonpregnant women who are considered low risk for pelvic cancer and who do not have symptoms. Ask your health care provider if a screening pelvic exam is right for you.  If you have had past treatment for cervical cancer or a condition that could lead to cancer, you need Pap tests and screening for cancer for at least 20 years after your treatment. If Pap tests have been discontinued, your risk factors (such as having a new sexual partner) need to be reassessed to determine if screening should resume. Some women have medical problems that increase the chance of getting cervical cancer. In these cases, your health care provider may recommend more frequent screening and Pap tests. Colorectal Cancer  This type of cancer can be detected and often prevented.  Routine colorectal cancer screening usually begins at 42 years of age and continues through 42 years of age.  Your health care provider may recommend screening at an earlier age if you have risk factors for colon cancer.  Your health care provider may also recommend using home test kits to check for hidden blood in the stool.  A small camera at the end of a tube can be used to examine your colon directly (sigmoidoscopy or colonoscopy). This is done to check for the earliest forms of colorectal cancer.  Routine  screening usually begins at age 50.  Direct examination of the colon should be repeated every 5-10 years through 42 years of age. However, you may need to be screened more often if early forms of precancerous polyps or small growths are found. Skin Cancer  Check your skin from head to toe regularly.  Tell your health care provider about any new moles or changes in moles, especially if there is a change in a mole's shape or color.  Also tell your health care provider if you have a mole that is larger than the size of a pencil eraser.  Always use sunscreen. Apply sunscreen liberally and repeatedly throughout the day.  Protect yourself by wearing long sleeves, pants, a wide-brimmed hat, and sunglasses whenever you are outside. Heart disease, diabetes, and high blood pressure  High blood pressure causes heart disease and increases the risk of stroke. High blood pressure is more likely to develop in: ? People who have blood pressure in the high end of the normal range (130-139/85-89 mm Hg). ? People   who are overweight or obese. ? People who are African American.  If you are 84-22 years of age, have your blood pressure checked every 3-5 years. If you are 67 years of age or older, have your blood pressure checked every year. You should have your blood pressure measured twice-once when you are at a hospital or clinic, and once when you are not at a hospital or clinic. Record the average of the two measurements. To check your blood pressure when you are not at a hospital or clinic, you can use: ? An automated blood pressure machine at a pharmacy. ? A home blood pressure monitor.  If you are between 52 years and 3 years old, ask your health care provider if you should take aspirin to prevent strokes.  Have regular diabetes screenings. This involves taking a blood sample to check your fasting blood sugar level. ? If you are at a normal weight and have a low risk for diabetes, have this test once  every three years after 42 years of age. ? If you are overweight and have a high risk for diabetes, consider being tested at a younger age or more often. Preventing infection Hepatitis B  If you have a higher risk for hepatitis B, you should be screened for this virus. You are considered at high risk for hepatitis B if: ? You were born in a country where hepatitis B is common. Ask your health care provider which countries are considered high risk. ? Your parents were born in a high-risk country, and you have not been immunized against hepatitis B (hepatitis B vaccine). ? You have HIV or AIDS. ? You use needles to inject street drugs. ? You live with someone who has hepatitis B. ? You have had sex with someone who has hepatitis B. ? You get hemodialysis treatment. ? You take certain medicines for conditions, including cancer, organ transplantation, and autoimmune conditions. Hepatitis C  Blood testing is recommended for: ? Everyone born from [birth year] through 1964. ? Anyone with known risk factors for hepatitis C. Sexually transmitted infections (STIs)  You should be screened for sexually transmitted infections (STIs) including gonorrhea and chlamydia if: ? You are sexually active and are younger than 42 years of age. ? You are older than 42 years of age and your health care provider tells you that you are at risk for this type of infection. ? Your sexual activity has changed since you were last screened and you are at an increased risk for chlamydia or gonorrhea. Ask your health care provider if you are at risk.  If you do not have HIV, but are at risk, it may be recommended that you take a prescription medicine daily to prevent HIV infection. This is called pre-exposure prophylaxis (PrEP). You are considered at risk if: ? You are sexually active and do not regularly use condoms or know the HIV status of your partner(s). ? You take drugs by injection. ? You are sexually active with a partner  who has HIV. Talk with your health care provider about whether you are at high risk of being infected with HIV. If you choose to begin PrEP, you should first be tested for HIV. You should then be tested every 3 months for as long as you are taking PrEP. Pregnancy  If you are premenopausal and you may become pregnant, ask your health care provider about preconception counseling.  If you may become pregnant, take 400 to 800 micrograms (mcg) of folic acid every  day.  If you want to prevent pregnancy, talk to your health care provider about birth control (contraception). Osteoporosis and menopause  Osteoporosis is a disease in which the bones lose minerals and strength with aging. This can result in serious bone fractures. Your risk for osteoporosis can be identified using a bone density scan.  If you are 65 years of age or older, or if you are at risk for osteoporosis and fractures, ask your health care provider if you should be screened.  Ask your health care provider whether you should take a calcium or vitamin D supplement to lower your risk for osteoporosis.  Menopause may have certain physical symptoms and risks.  Hormone replacement therapy may reduce some of these symptoms and risks. Talk to your health care provider about whether hormone replacement therapy is right for you. Follow these instructions at home:  Schedule regular health, dental, and eye exams.  Stay current with your immunizations.  Do not use any tobacco products including cigarettes, chewing tobacco, or electronic cigarettes.  If you are pregnant, do not drink alcohol.  If you are breastfeeding, limit how much and how often you drink alcohol.  Limit alcohol intake to no more than 1 drink per day for nonpregnant women. One drink equals 12 ounces of beer, 5 ounces of wine, or 1 ounces of hard liquor.  Do not use street drugs.  Do not share needles.  Ask your health care provider for help if you need support  or information about quitting drugs.  Tell your health care provider if you often feel depressed.  Tell your health care provider if you have ever been abused or do not feel safe at home. This information is not intended to replace advice given to you by your health care provider. Make sure you discuss any questions you have with your health care provider. Document Released: 07/15/2010 Document Revised: 06/07/2015 Document Reviewed: 10/03/2014 Elsevier Interactive Patient Education  2019 [employer]  Carbohydrate Counting for Diabetes Mellitus, Adult  Carbohydrate counting is a method of keeping track of how many carbohydrates you eat. Eating carbohydrates naturally increases the amount of sugar (glucose) in the blood. Counting how many carbohydrates you eat helps keep your blood glucose within normal limits, which helps you manage your diabetes (diabetes mellitus). It is important to know how many carbohydrates you can safely have in each meal. This is different for every person. A diet and nutrition specialist (registered dietitian) can help you make a meal plan and calculate how many carbohydrates you should have at each meal and snack. Carbohydrates are found in the following foods:  Grains, such as breads and cereals.  Dried beans and soy products.  Starchy vegetables, such as potatoes, peas, and corn.  Fruit and fruit juices.  Milk and yogurt.  Sweets and snack foods, such as cake, cookies, candy, chips, and soft drinks. How do I count carbohydrates? There are two ways to count carbohydrates in food. You can use either of the methods or a combination of both. Reading "Nutrition Facts" on packaged food The "Nutrition Facts" list is included on the labels of almost all packaged foods and beverages in the [place] It includes:  The serving size.  Information about nutrients in each serving, including the grams (g) of carbohydrate per serving. To use the "Nutrition  Facts":  Decide how many servings you will have.  Multiply the number of servings by the number of carbohydrates per serving.  The resulting number is the total amount of   carbohydrates that you will be having. Learning standard serving sizes of other foods When you eat carbohydrate foods that are not packaged or do not include "Nutrition Facts" on the label, you need to measure the servings in order to count the amount of carbohydrates:  Measure the foods that you will eat with a food scale or measuring cup, if needed.  Decide how many standard-size servings you will eat.  Multiply the number of servings by 15. Most carbohydrate-rich foods have about 15 g of carbohydrates per serving. ? For example, if you eat 8 oz (170 g) of strawberries, you will have eaten 2 servings and 30 g of carbohydrates (2 servings x 15 g = 30 g).  For foods that have more than one food mixed, such as soups and casseroles, you must count the carbohydrates in each food that is included. The following list contains standard serving sizes of common carbohydrate-rich foods. Each of these servings has about 15 g of carbohydrates:   hamburger bun or  English muffin.   oz (15 mL) syrup.   oz (14 g) jelly.  1 slice of bread.  1 six-inch tortilla.  3 oz (85 g) cooked rice or pasta.  4 oz (113 g) cooked dried beans.  4 oz (113 g) starchy vegetable, such as peas, corn, or potatoes.  4 oz (113 g) hot cereal.  4 oz (113 g) mashed potatoes or  of a large baked potato.  4 oz (113 g) canned or frozen fruit.  4 oz (120 mL) fruit juice.  4-6 crackers.  6 chicken nuggets.  6 oz (170 g) unsweetened dry cereal.  6 oz (170 g) plain fat-free yogurt or yogurt sweetened with artificial sweeteners.  8 oz (240 mL) milk.  8 oz (170 g) fresh fruit or one small piece of fruit.  24 oz (680 g) popped popcorn. Example of carbohydrate counting Sample meal  3 oz (85 g) chicken breast.  6 oz (170 g) brown  rice.  4 oz (113 g) corn.  8 oz (240 mL) milk.  8 oz (170 g) strawberries with sugar-free whipped topping. Carbohydrate calculation 1. Identify the foods that contain carbohydrates: ? Rice. ? Corn. ? Milk. ? Strawberries. 2. Calculate how many servings you have of each food: ? 2 servings rice. ? 1 serving corn. ? 1 serving milk. ? 1 serving strawberries. 3. Multiply each number of servings by 15 g: ? 2 servings rice x 15 g = 30 g. ? 1 serving corn x 15 g = 15 g. ? 1 serving milk x 15 g = 15 g. ? 1 serving strawberries x 15 g = 15 g. 4. Add together all of the amounts to find the total grams of carbohydrates eaten: ? 30 g + 15 g + 15 g + 15 g = 75 g of carbohydrates total. Summary  Carbohydrate counting is a method of keeping track of how many carbohydrates you eat.  Eating carbohydrates naturally increases the amount of sugar (glucose) in the blood.  Counting how many carbohydrates you eat helps keep your blood glucose within normal limits, which helps you manage your diabetes.  A diet and nutrition specialist (registered dietitian) can help you make a meal plan and calculate how many carbohydrates you should have at each meal and snack. This information is not intended to replace advice given to you by your health care provider. Make sure you discuss any questions you have with your health care provider. Document Released: 12/30/2004 Document Revised: 07/09/2016   Document Reviewed: 06/13/2015 Elsevier Interactive Patient Education  [employer].

## 2018-04-19 NOTE — Progress Notes (Signed)
Robin Glover 1975-05-03 [id number]    History:    Presents for annual exam.  Light cycles on Micronor. 2015 CIN-1 normal Paps after.  Normal  mammogram history.  Mother breast cancer 25 survivor BRCA status unknown.  New partner.  Primary care manages hypertension, hypercholesteremia and diabetes.  Past medical history, past surgical history, family history and social history were all reviewed and documented in the EPIC chart.  Works for [employer].  Daughter 52, has had Gardasil.  Parents diabetes, Father on insulin, mother hypertension.  ROS:  A ROS was performed and pertinent positives and negatives are included.  Exam:  Vitals:   04/19/18 1503  BP: 132/80  Weight: 269 lb (122 kg)  Height: 5' 5"  (1.651 m)   Body mass index is 44.76 kg/m.   General appearance:  Normal Thyroid:  Symmetrical, normal in size, without palpable masses or nodularity. Respiratory  Auscultation:  Clear without wheezing or rhonchi Cardiovascular  Auscultation:  Regular rate, without rubs, murmurs or gallops  Edema/varicosities:  Not grossly evident Abdominal  Soft,nontender, without masses, guarding or rebound.  Liver/spleen:  No organomegaly noted  Hernia:  None appreciated  Skin  Inspection:  Grossly normal   Breasts: Examined lying and sitting.     Right: Without masses, retractions, discharge or axillary adenopathy.     Left: Without masses, retractions, discharge or axillary adenopathy. Gentitourinary   Inguinal/mons:  Normal without inguinal adenopathy  External genitalia:  Normal  BUS/Urethra/Skene's glands:  Normal  Vagina: Erythemic  Cervix:  Normal  Uterus:   normal in size, shape and contour.  Midline and mobile  Adnexa/parametria:     Rt: Without masses or tenderness.   Lt: Without masses or tenderness.  Anus and perineum: Normal  Digital rectal exam: Normal sphincter tone without palpated masses or tenderness  Assessment/Plan:  43 y.o. SBF G1, P1 for annual exam  with complaint of vaginal itching.  Light monthly cycle on Micronor 2015 LGSIL normal Paps after Yeast vaginitis STD screen Morbid obesity Hypertension, diabetes, hypercholesteremia-primary care manages labs and meds Mother at 48 breast cancer/ survivor  Plan: Diflucan 150 p.o. x1 dose, yeast prevention discussed.  Reviewed importance of healthy diet, increasing regular cardio type exercise.  Micronor prescription, proper use, slight risk for blood clots and strokes, condoms encouraged until permanent partner.  SBEs, continue annual screening mammogram, calcium rich foods, vitamin D 1000 daily encouraged.  Encouraged low-carb/calorie diet.  Breast cancer genetic testing reviewed declines at this time.  Encouraged self-care, leisure activities, helping to care for aging father.  Pap, Pap normal with negative HR HPV 2019, new screening guidelines reviewed.   [place], 4:13 PM 04/19/2018

## 2018-04-20 LAB — C. TRACHOMATIS/N. GONORRHOEAE RNA
C. trachomatis RNA, TMA: NOT DETECTED
N. gonorrhoeae RNA, TMA: NOT DETECTED

## 2018-04-20 LAB — RPR: RPR Ser Ql: NONREACTIVE

## 2018-04-20 LAB — HIV ANTIBODY (ROUTINE TESTING W REFLEX): HIV 1&2 Ab, 4th Generation: NONREACTIVE

## 2018-04-20 NOTE — Addendum Note (Signed)
Addended by: Berna Spare A on: 04/20/2018 07:57 AM   Modules accepted: Orders

## 2018-04-21 LAB — PAP IG W/ RFLX HPV ASCU

## 2018-08-26 ENCOUNTER — Encounter: Payer: Self-pay | Admitting: Women's Health

## 2018-10-19 ENCOUNTER — Other Ambulatory Visit: Payer: Self-pay

## 2018-10-19 DIAGNOSIS — Z20822 Contact with and (suspected) exposure to covid-19: Secondary | ICD-10-CM

## 2018-10-21 LAB — NOVEL CORONAVIRUS, NAA: SARS-CoV-2, NAA: NOT DETECTED

## 2019-02-09 ENCOUNTER — Encounter (INDEPENDENT_AMBULATORY_CARE_PROVIDER_SITE_OTHER): Payer: Self-pay | Admitting: Bariatrics

## 2019-02-09 ENCOUNTER — Other Ambulatory Visit: Payer: Self-pay

## 2019-02-09 ENCOUNTER — Ambulatory Visit (INDEPENDENT_AMBULATORY_CARE_PROVIDER_SITE_OTHER): Payer: BC Managed Care – PPO | Admitting: Bariatrics

## 2019-02-09 VITALS — BP 133/85 | HR 83 | Temp 98.5°F | Ht 66.0 in | Wt 250.0 lb

## 2019-02-09 DIAGNOSIS — I10 Essential (primary) hypertension: Secondary | ICD-10-CM

## 2019-02-09 DIAGNOSIS — R0602 Shortness of breath: Secondary | ICD-10-CM | POA: Diagnosis not present

## 2019-02-09 DIAGNOSIS — E119 Type 2 diabetes mellitus without complications: Secondary | ICD-10-CM

## 2019-02-09 DIAGNOSIS — R5383 Other fatigue: Secondary | ICD-10-CM | POA: Diagnosis not present

## 2019-02-09 DIAGNOSIS — Z1331 Encounter for screening for depression: Secondary | ICD-10-CM

## 2019-02-09 DIAGNOSIS — Z0289 Encounter for other administrative examinations: Secondary | ICD-10-CM

## 2019-02-09 DIAGNOSIS — Z6837 Body mass index (BMI) 37.0-37.9, adult: Secondary | ICD-10-CM | POA: Insufficient documentation

## 2019-02-09 DIAGNOSIS — Z6841 Body Mass Index (BMI) 40.0 and over, adult: Secondary | ICD-10-CM

## 2019-02-09 DIAGNOSIS — E7800 Pure hypercholesterolemia, unspecified: Secondary | ICD-10-CM

## 2019-02-09 DIAGNOSIS — E66813 Obesity, class 3: Secondary | ICD-10-CM

## 2019-02-09 DIAGNOSIS — Z9189 Other specified personal risk factors, not elsewhere classified: Secondary | ICD-10-CM | POA: Diagnosis not present

## 2019-02-09 NOTE — Progress Notes (Signed)
Dear Dr. Melina Schools,   Thank you for referring Robin Glover to our clinic. The following note includes my evaluation and treatment recommendations.  Chief Complaint:   OBESITY Robin Glover (MR# [id number]) is a 44 y.o. female who presents for evaluation and treatment of obesity and related comorbidities. Current BMI is Body mass index is 40.35 kg/m.Robin Glover has been struggling with her weight for many years and has been unsuccessful in either losing weight, maintaining weight loss, or reaching her healthy weight goal.  Robin Glover is currently in the action stage of change and ready to dedicate time achieving and maintaining a healthier weight. Robin Glover is interested in becoming our patient and working on intensive lifestyle modifications including (but not limited to) diet and exercise for weight loss.  Robin Glover states that she likes to cook sometimes, but due to her hectic schedule she may lack meal planning and prep. She states that she craves sweets.  Robin Glover's habits were reviewed today and are as follows: Her family eats meals together, she thinks her family will eat healthier with her, her desired weight loss is 80 lbs, she has been heavy most of her life, she started gaining weight in college and after having her baby, her heaviest weight ever was 270-275 pounds, she craves sweets (chocolate, cakes, cookies), she skips breakfast a few days a week, she is frequently drinking liquids with calories, she frequently makes poor food choices, she has binge eating behaviors and she struggles with emotional eating.  Depression Screen Robin Glover's Food and Mood (modified PHQ-9) score was 9.  Depression screen PHQ 2/9 02/09/2019  Decreased Interest 1  Down, Depressed, Hopeless 1  PHQ - 2 Score 2  Altered sleeping 3  Tired, decreased energy 3  Change in appetite 1  Feeling bad or failure about yourself  0  Trouble concentrating 0  Moving slowly or fidgety/restless 0  Suicidal  thoughts 0  PHQ-9 Score 9  Difficult doing work/chores Not difficult at all   Subjective:   Other fatigue. Robin Glover admits to daytime somnolence and admits to waking up still tired. Patent has a history of symptoms of daytime fatigue, Epworth sleepiness scale and morning headache. Robin Glover generally gets 4-5 hours of sleep per night, and states that she does not sleep well most nights. Snoring is present. Apneic episodes are not present. Epworth Sleepiness Score is 13.  Shortness of breath on exertion. Robin Glover notes increasing shortness of breath with certain activities and seems to be worsening over time with weight gain. She notes getting out of breath sooner with activity than she used to. This has gotten worse recently. Robin Glover denies shortness of breath at rest or orthopnea.  Essential hypertension. Robin Glover is taking lisinopril. Blood pressure is well controlled.  BP Readings from Last 3 Encounters:  02/09/19 133/85  04/19/18 132/80  06/22/17 120/82   Lab Results  Component Value Date   CREATININE 0.63 06/11/2010   CREATININE 0.66 07/28/2008   Hypercholesteremia. Robin Glover is taking Pravachol. No myalgias.  Type 2 diabetes mellitus without complication, without long-term current use of insulin ([name]). [name] is taking Ozempic.  Lab Results  Component Value Date   HGBA1C 6.0 (H) 01/15/2015   Lab Results  Component Value Date   LDLCALC 132 (H) 01/15/2015   CREATININE 0.63 06/11/2010   No results found for: INSULIN  Depression screening. Robin Glover had a mildly positive depression screening with a PHQ-9 score of 9.  At risk for heart disease. Robin Glover is at a higher than average  risk for cardiovascular disease due to hypertension, hyperlipidemia, and obesity. Reviewed: no chest pain on exertion, no dyspnea on exertion, and no swelling of ankles.  Assessment/Plan:   Other fatigue. [name] does feel that her weight is causing her energy to be lower than it should be. Fatigue may be related  to obesity, depression or many other causes. Labs will be ordered, and in the meanwhile, [name] will focus on self care including making healthy food choices, increasing physical activity and focusing on stress reduction. EKG 12-Lead, VITAMIN D 25 Hydroxy (Vit-D Deficiency, Fractures) labs ordered.  Shortness of breath on exertion. Robin Glover does feel that she gets out of breath more easily that she used to when she exercises. Robin Glover's shortness of breath appears to be obesity related and exercise induced. She has agreed to work on weight loss and gradually increase exercise to treat her exercise induced shortness of breath. Will continue to monitor closely.  Essential hypertension. Robin Glover is working on healthy weight loss and exercise to improve blood pressure control. We will watch for signs of hypotension as she continues her lifestyle modifications. She will continue her medications as prescribed.  Hypercholesteremia. Robin Glover will continue Pravachol as prescribed.  Type 2 diabetes mellitus without complication, without long-term current use of insulin (HCC). Good blood sugar control is important to decrease the likelihood of diabetic complications such as nephropathy, neuropathy, limb loss, blindness, coronary artery disease, and death. Intensive lifestyle modification including diet, exercise and weight loss are the first line of treatment for diabetes. Robin Glover will continue the Ozempic. Hemoglobin A1c, Insulin, random, Comprehensive metabolic panel labs ordered.  Depression screening. Robin Glover had a positive depression screening. Depression is commonly associated with obesity and often results in emotional eating behaviors. We will monitor this closely and work on CBT to help improve the non-hunger eating patterns. Referral to Psychology may be required if no improvement is seen as she continues in our clinic.  At risk for heart disease. Robin Glover was given approximately 15 minutes of coronary artery disease  prevention counseling today. She is 44 y.o. female and has risk factors for heart disease including obesity. We discussed intensive lifestyle modifications today with an emphasis on specific weight loss instructions and strategies.   Repetitive spaced learning was employed today to elicit superior memory formation and behavioral change.  Class 3 severe obesity with serious comorbidity and body mass index (BMI) of 40.0 to 44.9 in adult, unspecified obesity type (HCC).  Traniya is currently in the action stage of change and her goal is to continue with weight loss efforts. I recommend Jizelle begin the structured treatment plan as follows:  She has agreed to the Category 2 Plan.  She will work on meal planning, intentional eating, and decreasing portion size.  Exercise goals: For substantial health benefits, adults should do at least 150 minutes (2 hours and 30 minutes) a week of moderate-intensity, or 75 minutes (1 hour and 15 minutes) a week of vigorous-intensity aerobic physical activity, or an equivalent combination of moderate- and vigorous-intensity aerobic activity. Aerobic activity should be performed in episodes of at least 10 minutes, and preferably, it should be spread throughout the week.   Behavioral modification strategies: increasing lean protein intake, decreasing simple carbohydrates, increasing vegetables, increasing water intake, decreasing eating out, no skipping meals, meal planning and cooking strategies, keeping healthy foods in the home and planning for success.  She was informed of the importance of frequent follow-up visits to maximize her success with intensive lifestyle modifications for her multiple health  conditions. She was informed we would discuss her lab results at her next visit unless there is a critical issue that needs to be addressed sooner. Timber agreed to keep her next visit at the agreed upon time to discuss these results.  Objective:   Blood pressure  133/85, pulse 83, temperature 98.5 F (36.9 C), height 5\' 6"  (1.676 m), weight 250 lb (113.4 kg), last menstrual period 02/02/2019, SpO2 100 %. Body mass index is 40.35 kg/m.  EKG: Sinus  Rhythm with a rate of 86 BPM. Poor R wave progression. No acute changes. Otherwise normal  Indirect Calorimeter completed today shows a VO2 of 221 and a REE of 1535.  Her calculated basal metabolic rate is 02/04/2019 thus her basal metabolic rate is worse than expected.  General: Cooperative, alert, well developed, in no acute distress. HEENT: Conjunctivae and lids unremarkable. Cardiovascular: Regular rhythm.  Lungs: Normal work of breathing. Neurologic: No focal deficits.   Lab Results  Component Value Date   CREATININE 0.63 06/11/2010   BUN 13 06/11/2010   NA 135 06/11/2010   K 3.7 06/11/2010   CL 98 06/11/2010   CO2 28 06/11/2010   Lab Results  Component Value Date   ALT 12 06/11/2010   AST 14 06/11/2010   ALKPHOS 65 06/11/2010   BILITOT 0.2 (L) 06/11/2010   Lab Results  Component Value Date   HGBA1C 6.0 (H) 01/15/2015   No results found for: INSULIN Lab Results  Component Value Date   TSH 2.986 01/15/2015   Lab Results  Component Value Date   CHOL 198 01/15/2015   HDL 45 (L) 01/15/2015   LDLCALC 132 (H) 01/15/2015   TRIG 105 01/15/2015   CHOLHDL 4.4 01/15/2015   Lab Results  Component Value Date   WBC 5.3 01/15/2015   HGB 13.6 01/15/2015   HCT 40.4 01/15/2015   MCV 88.8 01/15/2015   PLT 348 01/15/2015   No results found for: IRON, TIBC, FERRITIN  Attestation Statements:   Reviewed by clinician on day of visit: allergies, medications, problem list, medical history, surgical history, family history, social history, and previous encounter notes.  03/15/2015, am acting as Fernanda Drum for Energy manager, DO   I have reviewed the above documentation for accuracy and completeness, and I agree with the above. [employer], DO

## 2019-02-10 LAB — COMPREHENSIVE METABOLIC PANEL
ALT: 15 IU/L (ref 0–32)
AST: 15 IU/L (ref 0–40)
Albumin/Globulin Ratio: 1.6 (ref 1.2–2.2)
Albumin: 4.2 g/dL (ref 3.8–4.8)
Alkaline Phosphatase: 63 IU/L (ref 39–117)
BUN/Creatinine Ratio: 15 (ref 9–23)
BUN: 10 mg/dL (ref 6–24)
Bilirubin Total: 0.2 mg/dL (ref 0.0–1.2)
CO2: 25 mmol/L (ref 20–29)
Calcium: 9.3 mg/dL (ref 8.7–10.2)
Chloride: 102 mmol/L (ref 96–106)
Creatinine, Ser: 0.68 mg/dL (ref 0.57–1.00)
GFR calc Af Amer: 125 mL/min/{1.73_m2} (ref >59)
GFR calc non Af Amer: 108 mL/min/{1.73_m2} (ref >59)
Globulin, Total: 2.7 g/dL (ref 1.5–4.5)
Glucose: 77 mg/dL (ref 65–99)
Potassium: 4.3 mmol/L (ref 3.5–5.2)
Sodium: 138 mmol/L (ref 134–144)
Total Protein: 6.9 g/dL (ref 6.0–8.5)

## 2019-02-10 LAB — HEMOGLOBIN A1C
Est. average glucose Bld gHb Est-mCnc: 120 mg/dL
Hgb A1c MFr Bld: 5.8 % — ABNORMAL HIGH (ref 4.8–5.6)

## 2019-02-10 LAB — INSULIN, RANDOM: INSULIN: 12.5 u[IU]/mL (ref 2.6–24.9)

## 2019-02-10 LAB — VITAMIN D 25 HYDROXY (VIT D DEFICIENCY, FRACTURES): Vit D, 25-Hydroxy: 13 ng/mL — ABNORMAL LOW (ref 30.0–100.0)

## 2019-02-14 ENCOUNTER — Encounter (INDEPENDENT_AMBULATORY_CARE_PROVIDER_SITE_OTHER): Payer: Self-pay | Admitting: Bariatrics

## 2019-02-14 DIAGNOSIS — E559 Vitamin D deficiency, unspecified: Secondary | ICD-10-CM | POA: Insufficient documentation

## 2019-02-23 ENCOUNTER — Encounter (INDEPENDENT_AMBULATORY_CARE_PROVIDER_SITE_OTHER): Payer: Self-pay | Admitting: Bariatrics

## 2019-02-23 ENCOUNTER — Other Ambulatory Visit: Payer: Self-pay

## 2019-02-23 ENCOUNTER — Ambulatory Visit (INDEPENDENT_AMBULATORY_CARE_PROVIDER_SITE_OTHER): Payer: BC Managed Care – PPO | Admitting: Bariatrics

## 2019-02-23 VITALS — BP 123/80 | HR 82 | Temp 98.2°F | Ht 66.0 in | Wt 240.0 lb

## 2019-02-23 DIAGNOSIS — Z6838 Body mass index (BMI) 38.0-38.9, adult: Secondary | ICD-10-CM

## 2019-02-23 DIAGNOSIS — Z9189 Other specified personal risk factors, not elsewhere classified: Secondary | ICD-10-CM | POA: Diagnosis not present

## 2019-02-23 DIAGNOSIS — E119 Type 2 diabetes mellitus without complications: Secondary | ICD-10-CM | POA: Diagnosis not present

## 2019-02-23 DIAGNOSIS — K5909 Other constipation: Secondary | ICD-10-CM

## 2019-02-23 DIAGNOSIS — E559 Vitamin D deficiency, unspecified: Secondary | ICD-10-CM | POA: Diagnosis not present

## 2019-02-23 DIAGNOSIS — I10 Essential (primary) hypertension: Secondary | ICD-10-CM | POA: Diagnosis not present

## 2019-02-23 MED ORDER — VITAMIN D (ERGOCALCIFEROL) 1.25 MG (50000 UNIT) PO CAPS: 50000.0000 [IU] | ORAL_CAPSULE | ORAL | 0 refills | Status: DC

## 2019-02-24 ENCOUNTER — Encounter (INDEPENDENT_AMBULATORY_CARE_PROVIDER_SITE_OTHER): Payer: Self-pay | Admitting: Bariatrics

## 2019-02-24 NOTE — Progress Notes (Signed)
Chief Complaint:   OBESITY Robin Glover is here to discuss her progress with her obesity treatment plan along with follow-up of her obesity related diagnoses. Robin Glover is on the Category 2 Plan and states she is following her eating plan approximately 80% of the time. Robin Glover states she is exercising 0 minutes 0 times per week.  Today's visit was #: 2 Starting weight: 250 lbs Starting date: 02/09/2019 Today's weight: 240 lbs Today's date: 02/23/2019 Total lbs lost to date: 10 Total lbs lost since last in-office visit: 10  Interim History: Robin Glover is down 10 pounds. She states that she has been under increased stress, secondary to a death in the family. She is not a bread eater.  Subjective:   Type 2 diabetes mellitus without complication, without long-term current use of insulin (HCC) Robin Glover is currently taking Ozempic. Her last A1c was 5.8 (02/09/19).   Lab Results  Component Value Date   HGBA1C 5.8 (H) 02/09/2019   HGBA1C 6.0 (H) 01/15/2015   Lab Results  Component Value Date   LDLCALC 132 (H) 01/15/2015   CREATININE 0.68 02/09/2019   Lab Results  Component Value Date   INSULIN 12.5 02/09/2019   Essential hypertension Robin Glover is taking Zestril currently. Her blood pressure is well controlled.  BP Readings from Last 3 Encounters:  02/23/19 123/80  02/09/19 133/85  04/19/18 132/80   Lab Results  Component Value Date   CREATININE 0.68 02/09/2019   CREATININE 0.63 06/11/2010   CREATININE 0.66 07/28/2008   Vitamin D deficiency Robin Glover's Vitamin D level was 13.0 on 02/09/19. She is not currently taking vit D. She denies nausea, vomiting or muscle weakness. Robin Glover gets minimal sunlight.  Other constipation Robin Glover notes irregular BM's.   At risk for osteoporosis Robin Glover is at higher risk of osteopenia and osteoporosis due to Vitamin D deficiency.   Assessment/Plan:   Type 2 diabetes mellitus without complication, without long-term current use of insulin  (HCC) Good blood sugar control is important to decrease the likelihood of diabetic complications such as nephropathy, neuropathy, limb loss, blindness, coronary artery disease, and death. Intensive lifestyle modification including diet, exercise and weight loss are the first line of treatment for diabetes. Robin Glover will continue Ozempic.  Essential hypertension Robin Glover is working on healthy weight loss and exercise to improve blood pressure control. Robin Glover will continue her current medications. We will watch for signs of hypotension as she continues her lifestyle modifications.  Vitamin D deficiency  Low Vitamin D level contributes to fatigue and are associated with obesity, breast, and colon cancer. Robin Glover agrees to continue to take prescription Vitamin D @50 ,000 IU every week #12 with no refills and she will follow-up for routine testing of Vitamin D, at least 2-3 times per year to avoid over-replacement.  Other constipation Robin Glover was informed that a decrease in bowel movement frequency is normal while losing weight, but stools should not be hard or painful. Robin Glover will work on increasing her water intake and increasing raw vegetables (greek yogurt with seasoning). She will take OTC Miralax as needed. Orders and follow up as documented in patient record.   Counseling Getting to Good Bowel Health: Your goal is to have one soft bowel movement each day. Drink at least 8 glasses of water each day. Eat plenty of fiber (goal is over 25 grams each day). It is best to get most of your fiber from dietary sources which includes leafy green vegetables, fresh fruit, and whole grains. You may need to add fiber  with the help of OTC fiber supplements. These include Metamucil, Citrucel, and Flaxseed. If you are still having trouble, try adding Miralax or Magnesium Citrate. If all of these changes do not work, Cabin crew.  At risk for osteoporosis Robin Glover was given approximately 15 minutes of osteoporosis  prevention counseling today. Robin Glover is at risk for osteopenia and osteoporosis due to her Vitamin D deficiency. She was encouraged to take her Vitamin D and follow her higher calcium diet and increase strengthening exercise to help strengthen her bones and decrease her risk of osteopenia and osteoporosis.  Repetitive spaced learning was employed today to elicit superior memory formation and behavioral change.  Class 2 severe obesity with serious comorbidity and body mass index (BMI) of 38.0 to 38.9 in adult, unspecified obesity type (HCC) Robin Glover is currently in the action stage of change. As such, her goal is to continue with weight loss efforts. She has agreed to the Category 2 Plan with additional lunch options, meal plan dinners.   Exercise goals: No exercise has been prescribed at this time.  Behavioral modification strategies: increasing lean protein intake, decreasing simple carbohydrates, increasing vegetables, increasing water intake, decreasing eating out, no skipping meals, meal planning and cooking strategies, keeping healthy foods in the home and planning for success.  Reviewed labs independently with the patient (CMP, vitamin D, Hgb A1c, insulin).  Robin Glover has agreed to follow-up with our clinic in 2 weeks. She was informed of the importance of frequent follow-up visits to maximize her success with intensive lifestyle modifications for her multiple health conditions.   Objective:   Blood pressure 123/80, pulse 82, temperature 98.2 F (36.8 C), height 5\' 6"  (1.676 m), weight 240 lb (108.9 kg), last menstrual period 02/02/2019, SpO2 100 %. Body mass index is 38.74 kg/m.  General: Cooperative, alert, well developed, in no acute distress. HEENT: Conjunctivae and lids unremarkable. Cardiovascular: Regular rhythm.  Lungs: Normal work of breathing. Neurologic: No focal deficits.   Lab Results  Component Value Date   CREATININE 0.68 02/09/2019   BUN 10 02/09/2019   NA 138  02/09/2019   K 4.3 02/09/2019   CL 102 02/09/2019   CO2 25 02/09/2019   Lab Results  Component Value Date   ALT 15 02/09/2019   AST 15 02/09/2019   ALKPHOS 63 02/09/2019   BILITOT 0.2 02/09/2019   Lab Results  Component Value Date   HGBA1C 5.8 (H) 02/09/2019   HGBA1C 6.0 (H) 01/15/2015   Lab Results  Component Value Date   INSULIN 12.5 02/09/2019   Lab Results  Component Value Date   TSH 2.986 01/15/2015   Lab Results  Component Value Date   CHOL 198 01/15/2015   HDL 45 (L) 01/15/2015   LDLCALC 132 (H) 01/15/2015   TRIG 105 01/15/2015   CHOLHDL 4.4 01/15/2015   Lab Results  Component Value Date   WBC 5.3 01/15/2015   HGB 13.6 01/15/2015   HCT 40.4 01/15/2015   MCV 88.8 01/15/2015   PLT 348 01/15/2015   No results found for: IRON, TIBC, FERRITIN   Ref. Range 02/09/2019 12:27  Vitamin D, 25-Hydroxy Latest Ref Range: 30.0 - 100.0 ng/mL 13.0 (L)    Attestation Statements:   Reviewed by clinician on day of visit: allergies, medications, problem list, medical history, surgical history, family history, social history, and previous encounter notes.  Corey Skains, am acting as Location manager for [employer]. Owens Shark, DO.  I have reviewed the above documentation for accuracy and completeness, and I agree  with the above. -  Pammie Chirino A. Manson Passey, DO

## 2019-03-07 ENCOUNTER — Ambulatory Visit (INDEPENDENT_AMBULATORY_CARE_PROVIDER_SITE_OTHER): Payer: BC Managed Care – PPO | Admitting: Bariatrics

## 2019-03-07 ENCOUNTER — Other Ambulatory Visit: Payer: Self-pay

## 2019-03-07 ENCOUNTER — Encounter (INDEPENDENT_AMBULATORY_CARE_PROVIDER_SITE_OTHER): Payer: Self-pay | Admitting: Bariatrics

## 2019-03-07 VITALS — BP 119/76 | HR 79 | Temp 98.0°F | Ht 66.0 in | Wt 239.0 lb

## 2019-03-07 DIAGNOSIS — K5909 Other constipation: Secondary | ICD-10-CM

## 2019-03-07 DIAGNOSIS — E559 Vitamin D deficiency, unspecified: Secondary | ICD-10-CM | POA: Diagnosis not present

## 2019-03-07 DIAGNOSIS — E119 Type 2 diabetes mellitus without complications: Secondary | ICD-10-CM

## 2019-03-07 DIAGNOSIS — Z9189 Other specified personal risk factors, not elsewhere classified: Secondary | ICD-10-CM | POA: Diagnosis not present

## 2019-03-07 DIAGNOSIS — Z6838 Body mass index (BMI) 38.0-38.9, adult: Secondary | ICD-10-CM

## 2019-03-07 MED ORDER — VITAMIN D (ERGOCALCIFEROL) 1.25 MG (50000 UNIT) PO CAPS: 50000.0000 [IU] | ORAL_CAPSULE | ORAL | 0 refills | Status: DC

## 2019-03-08 ENCOUNTER — Encounter (INDEPENDENT_AMBULATORY_CARE_PROVIDER_SITE_OTHER): Payer: Self-pay | Admitting: Bariatrics

## 2019-03-08 NOTE — Progress Notes (Signed)
Chief Complaint:   OBESITY Robin Glover is here to discuss her progress with her obesity treatment plan along with follow-up of her obesity related diagnoses. Robin Glover is on the Category 2 Plan with lunch options and states she is following her eating plan approximately 90% of the time. Robin Glover states she is exercising 0 minutes 0 times per week.  Today's visit was #: 3 Starting weight: 250 lbs Starting date: 02/09/2019 Today's weight: 239 lbs Today's date: 03/07/2019 Total lbs lost to date: 11 Total lbs lost since last in-office visit: 1  Interim History: Robin Glover is down 1 lb. She does well with her water on most days. She struggles with variety in her meals.  Subjective:   Type 2 diabetes mellitus without complication, without long-term current use of insulin ([place]). [name] is on Semaglutide.  Lab Results  Component Value Date   HGBA1C 5.8 (H) 02/09/2019   HGBA1C 6.0 (H) 01/15/2015   Lab Results  Component Value Date   LDLCALC 132 (H) 01/15/2015   CREATININE 0.68 02/09/2019   Lab Results  Component Value Date   INSULIN 12.5 02/09/2019   Vitamin D deficiency. No nausea, vomiting, or muscle weakness. Last Vitamin D level 13.0 on 02/09/2019.  Other constipation. Robin Glover is drinking beet juice. She admits to retained stools.  At risk for hypoglycemia. [name] is at increased risk for hypoglycemia due to diabetes mellitus and dietary changes.  Assessment/Plan:   Type 2 diabetes mellitus without complication, without long-term current use of insulin ([place]). Good blood sugar control is important to decrease the likelihood of diabetic complications such as nephropathy, neuropathy, limb loss, blindness, coronary artery disease, and death. Intensive lifestyle modification including diet, exercise and weight loss are the first line of treatment for diabetes. Charo will continue her medications as directed.  Vitamin D deficiency. Low Vitamin D level contributes to fatigue  and are associated with obesity, breast, and colon cancer. She was given a prescription for Vitamin D, Ergocalciferol, (DRISDOL) 1.25 MG (50000 UNIT) CAPS capsule every week #4 with 0 refills and will follow-up for routine testing of Vitamin D, at least 2-3 times per year to avoid over-replacement.     Other constipation. [name] was informed that a decrease in bowel movement frequency is normal while losing weight, but stools should not be hard or painful. Orders and follow up as documented in patient record. She will consider Fleet's Enema, glycerin suppositories or MiraLax.  Counseling Getting to Good Bowel Health: Your goal is to have one soft bowel movement each day. Drink at least 8 glasses of water each day. Eat plenty of fiber (goal is over 25 grams each day). It is best to get most of your fiber from dietary sources which includes leafy green vegetables, fresh fruit, and whole grains. You may need to add fiber with the help of OTC fiber supplements. These include Metamucil, Citrucel, and Flaxseed. If you are still having trouble, try adding Miralax or Magnesium Citrate. If all of these changes do not work, Cabin crew.  At risk for hypoglycemia. Robin Glover was given approximately 15 minutes of counseling today regarding prevention of hypoglycemia. She was advised of symptoms of hypoglycemia. Robin Glover was instructed to avoid skipping meals, eat regular protein rich meals and schedule low calorie snacks as needed.   Repetitive spaced learning was employed today to elicit superior memory formation and behavioral change.  Class 2 severe obesity with serious comorbidity and body mass index (BMI) of 38.0 to 38.9 in adult, unspecified  obesity type (HCC).  Robin Glover is currently in the action stage of change. As such, her goal is to continue with weight loss efforts. She has agreed to the Category 2 Plan with lunch options.  She will work on meal planning and intentional eating.   Exercise goals:  All adults should avoid inactivity. Some physical activity is better than none, and adults who participate in any amount of physical activity gain some health benefits.  Behavioral modification strategies: increasing lean protein intake, decreasing simple carbohydrates, increasing vegetables, increasing water intake, decreasing eating out, no skipping meals, meal planning and cooking strategies, keeping healthy foods in the home, better snacking choices and planning for success.  Robin Glover has agreed to follow-up with our clinic in 2 weeks. She was informed of the importance of frequent follow-up visits to maximize her success with intensive lifestyle modifications for her multiple health conditions.   Objective:   Blood pressure 119/76, pulse 79, temperature 98 F (36.7 C), temperature source Oral, height 5\' 6"  (1.676 m), weight 239 lb (108.4 kg), last menstrual period 02/28/2019, SpO2 99 %. Body mass index is 38.58 kg/m.  General: Cooperative, alert, well developed, in no acute distress. HEENT: Conjunctivae and lids unremarkable. Cardiovascular: Regular rhythm.  Lungs: Normal work of breathing. Neurologic: No focal deficits.   Lab Results  Component Value Date   CREATININE 0.68 02/09/2019   BUN 10 02/09/2019   NA 138 02/09/2019   K 4.3 02/09/2019   CL 102 02/09/2019   CO2 25 02/09/2019   Lab Results  Component Value Date   ALT 15 02/09/2019   AST 15 02/09/2019   ALKPHOS 63 02/09/2019   BILITOT 0.2 02/09/2019   Lab Results  Component Value Date   HGBA1C 5.8 (H) 02/09/2019   HGBA1C 6.0 (H) 01/15/2015   Lab Results  Component Value Date   INSULIN 12.5 02/09/2019   Lab Results  Component Value Date   TSH 2.986 01/15/2015   Lab Results  Component Value Date   CHOL 198 01/15/2015   HDL 45 (L) 01/15/2015   LDLCALC 132 (H) 01/15/2015   TRIG 105 01/15/2015   CHOLHDL 4.4 01/15/2015   Lab Results  Component Value Date   WBC 5.3 01/15/2015   HGB 13.6 01/15/2015   HCT  40.4 01/15/2015   MCV 88.8 01/15/2015   PLT 348 01/15/2015   No results found for: IRON, TIBC, FERRITIN  Attestation Statements:   Reviewed by clinician on day of visit: allergies, medications, problem list, medical history, surgical history, family history, social history, and previous encounter notes.  03/15/2015, am acting as Fernanda Drum for Energy manager, DO   I have reviewed the above documentation for accuracy and completeness, and I agree with the above. [employer], DO

## 2019-03-23 ENCOUNTER — Other Ambulatory Visit: Payer: Self-pay

## 2019-03-23 ENCOUNTER — Encounter (INDEPENDENT_AMBULATORY_CARE_PROVIDER_SITE_OTHER): Payer: Self-pay | Admitting: Family Medicine

## 2019-03-23 ENCOUNTER — Ambulatory Visit (INDEPENDENT_AMBULATORY_CARE_PROVIDER_SITE_OTHER): Payer: BC Managed Care – PPO | Admitting: Family Medicine

## 2019-03-23 VITALS — BP 116/76 | HR 70 | Temp 98.2°F | Ht 66.0 in | Wt 236.0 lb

## 2019-03-23 DIAGNOSIS — E559 Vitamin D deficiency, unspecified: Secondary | ICD-10-CM

## 2019-03-23 DIAGNOSIS — E669 Obesity, unspecified: Secondary | ICD-10-CM

## 2019-03-23 DIAGNOSIS — E1169 Type 2 diabetes mellitus with other specified complication: Secondary | ICD-10-CM | POA: Diagnosis not present

## 2019-03-23 DIAGNOSIS — Z6838 Body mass index (BMI) 38.0-38.9, adult: Secondary | ICD-10-CM

## 2019-03-23 NOTE — Progress Notes (Signed)
Chief Complaint:   OBESITY Robin Glover is here to discuss her progress with her obesity treatment plan along with follow-up of her obesity related diagnoses. Robin Glover is on the Category 2 Plan and states she is following her eating plan approximately 70% of the time. Robin Glover states she is walking 30 minutes 3 times per week.  Today's visit was #: 4 Starting weight: 250 lbs Starting date: 02/09/2019 Today's weight: 236 lbs Today's date: 03/23/2019 Total lbs lost to date: 14 Total lbs lost since last in-office visit: 3  Interim History: Robin Glover has had a few meals off plan. She reports cravings due to her menstrual cycle. She is bored with lunch options.  Subjective:   Type 2 diabetes mellitus with obesity (Robin Glover). Diabetes is well controlled on Ozempic 0.25 weekly. She does not tolerate metformin due to diarrhea. She denies nausea or constipation.  Lab Results  Component Value Date   HGBA1C 5.8 (H) 02/09/2019   HGBA1C 6.0 (H) 01/15/2015   Lab Results  Component Value Date   LDLCALC 132 (H) 01/15/2015   CREATININE 0.68 02/09/2019   Lab Results  Component Value Date   INSULIN 12.5 02/09/2019   Vitamin D deficiency. Robin Glover reports her fatigue is improving. Last Vitamin D reported to be 36.7 on 01/26/2019.  Assessment/Plan:   Type 2 diabetes mellitus with obesity (Robin Glover). Good blood sugar control is important to decrease the likelihood of diabetic complications such as nephropathy, neuropathy, limb loss, blindness, coronary artery disease, and death. Intensive lifestyle modification including diet, exercise and weight loss are the first line of treatment for diabetes. Robin Glover will continue Ozempic as directed.  Vitamin D deficiency. Low Vitamin D level contributes to fatigue and are associated with obesity, breast, and colon cancer. She agrees to continue to take Vitamin D and will follow-up for routine testing of Vitamin D, at least 2-3 times per year to avoid  over-replacement.  Class 2 severe obesity with serious comorbidity and body mass index (BMI) of 38.0 to 38.9 in adult, unspecified obesity type (Robin Glover).  Robin Glover is currently in the action stage of change. As such, her goal is to continue with weight loss efforts. She has agreed to the Category 2 Plan and will journal 300-400 calories + 30 grams of protein at lunch.   Handouts were given on Journaling, Eating Out, and Protein Content of Food.  Exercise goals: Robin Glover will continue her current exercise regimen.  Behavioral modification strategies: decreasing simple carbohydrates, planning for success and keeping a strict food journal.  Robin Glover has agreed to follow-up with our clinic in 2 weeks. She was informed of the importance of frequent follow-up visits to maximize her success with intensive lifestyle modifications for her multiple health conditions.   Objective:   Blood pressure 116/76, pulse 70, temperature 98.2 F (36.8 C), temperature source Oral, height 5\' 6"  (1.676 m), weight 236 lb (107 kg), last menstrual period 02/28/2019, SpO2 100 %. Body mass index is 38.09 kg/m.  General: Cooperative, alert, well developed, in no acute distress. HEENT: Conjunctivae and lids unremarkable. Cardiovascular: Regular rhythm.  Lungs: Normal work of breathing. Neurologic: No focal deficits.   Lab Results  Component Value Date   CREATININE 0.68 02/09/2019   BUN 10 02/09/2019   NA 138 02/09/2019   K 4.3 02/09/2019   CL 102 02/09/2019   CO2 25 02/09/2019   Lab Results  Component Value Date   ALT 15 02/09/2019   AST 15 02/09/2019   ALKPHOS 63 02/09/2019  BILITOT 0.2 02/09/2019   Lab Results  Component Value Date   HGBA1C 5.8 (H) 02/09/2019   HGBA1C 6.0 (H) 01/15/2015   Lab Results  Component Value Date   INSULIN 12.5 02/09/2019   Lab Results  Component Value Date   TSH 2.986 01/15/2015   Lab Results  Component Value Date   CHOL 198 01/15/2015   HDL 45 (L) 01/15/2015    LDLCALC 132 (H) 01/15/2015   TRIG 105 01/15/2015   CHOLHDL 4.4 01/15/2015   Lab Results  Component Value Date   WBC 5.3 01/15/2015   HGB 13.6 01/15/2015   HCT 40.4 01/15/2015   MCV 88.8 01/15/2015   PLT 348 01/15/2015   No results found for: IRON, TIBC, FERRITIN  Attestation Statements:   Reviewed by clinician on day of visit: allergies, medications, problem list, medical history, surgical history, family history, social history, and previous encounter notes.  IMarianna Payment, am acting as Energy manager for [place], FNP   I have reviewed the above documentation for accuracy and completeness, and I agree with the above. -  Jesse Sans, FNP

## 2019-03-24 ENCOUNTER — Encounter (INDEPENDENT_AMBULATORY_CARE_PROVIDER_SITE_OTHER): Payer: Self-pay | Admitting: Family Medicine

## 2019-03-24 DIAGNOSIS — E1169 Type 2 diabetes mellitus with other specified complication: Secondary | ICD-10-CM | POA: Insufficient documentation

## 2019-03-24 DIAGNOSIS — E785 Hyperlipidemia, unspecified: Secondary | ICD-10-CM | POA: Insufficient documentation

## 2019-03-31 ENCOUNTER — Other Ambulatory Visit (INDEPENDENT_AMBULATORY_CARE_PROVIDER_SITE_OTHER): Payer: Self-pay | Admitting: Bariatrics

## 2019-03-31 DIAGNOSIS — E559 Vitamin D deficiency, unspecified: Secondary | ICD-10-CM

## 2019-04-07 ENCOUNTER — Ambulatory Visit (INDEPENDENT_AMBULATORY_CARE_PROVIDER_SITE_OTHER): Payer: BC Managed Care – PPO | Admitting: Family Medicine

## 2019-04-07 ENCOUNTER — Encounter (INDEPENDENT_AMBULATORY_CARE_PROVIDER_SITE_OTHER): Payer: Self-pay | Admitting: Family Medicine

## 2019-04-07 ENCOUNTER — Other Ambulatory Visit: Payer: Self-pay

## 2019-04-07 VITALS — BP 124/87 | HR 79 | Temp 98.3°F | Ht 66.0 in | Wt 235.0 lb

## 2019-04-07 DIAGNOSIS — Z9189 Other specified personal risk factors, not elsewhere classified: Secondary | ICD-10-CM | POA: Diagnosis not present

## 2019-04-07 DIAGNOSIS — E1169 Type 2 diabetes mellitus with other specified complication: Secondary | ICD-10-CM

## 2019-04-07 DIAGNOSIS — E559 Vitamin D deficiency, unspecified: Secondary | ICD-10-CM

## 2019-04-07 DIAGNOSIS — Z6838 Body mass index (BMI) 38.0-38.9, adult: Secondary | ICD-10-CM

## 2019-04-07 MED ORDER — VITAMIN D (ERGOCALCIFEROL) 1.25 MG (50000 UNIT) PO CAPS: 50000.0000 [IU] | ORAL_CAPSULE | ORAL | 0 refills | Status: DC

## 2019-04-11 ENCOUNTER — Encounter (INDEPENDENT_AMBULATORY_CARE_PROVIDER_SITE_OTHER): Payer: Self-pay | Admitting: Family Medicine

## 2019-04-11 NOTE — Progress Notes (Signed)
Chief Complaint:   OBESITY Robin Glover is here to discuss her progress with her obesity treatment plan along with follow-up of her obesity related diagnoses. Robin Glover is on the Category 2 Plan and keeping a food journal and adhering to recommended goals of 300-400 calories and 30+ grams of protein daily and states she is following her eating plan approximately 65% of the time. Robin Glover states she is walking for 30 minutes 1-2 times per week.  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 She has not been getting much sleep. She is back on days and trying to get back on track. She has been trying to make good choices however.  Subjective:   1. Vitamin D deficiency Robin Glover's Vit D level is not at goal. She is on prescription Vit D.  2. Type 2 diabetes mellitus with other specified complication, without long-term current use of insulin (Robin Glover) Robin Glover's diabetes mellitus is well controlled with Ozempic. She has had a few episodes of polyphagia. She does not check her sugars and notes rare hypoglycemia. Lab Results  Component Value Date   HGBA1C 5.8 (H) 02/09/2019    3. At risk for osteoporosis Robin Glover is at higher risk of osteopenia and osteoporosis due to Vitamin D deficiency.   Assessment/Plan:   1. Vitamin D deficiency Low Vitamin D level contributes to fatigue and are associated with obesity, breast, and colon cancer. We will refill prescription Vitamin D for 1 month. Robin Glover will follow-up for routine testing of Vitamin D, at least 2-3 times per year to avoid over-replacement.  - Vitamin D, Ergocalciferol, (DRISDOL) 1.25 MG (50000 UNIT) CAPS capsule; Take 1 capsule (50,000 Units total) by mouth every 7 (seven) days.  Dispense:  4 capsule; Refill: 0  2. Type 2 diabetes mellitus with other specified complication, without long-term current use of insulin (Robin Glover) Good blood sugar control is important to decrease the likelihood of diabetic complications such as nephropathy, neuropathy, limb loss, blindness, coronary artery disease, and death. Intensive lifestyle modification including diet, exercise and weight loss are the first line of treatment for diabetes. Robin Glover agreed to continue Ozempic at 0.25 mg weekly and will follow up.  3. At risk for osteoporosis Robin Glover was given approximately 15 minutes of osteoporosis prevention counseling today. Robin Glover is at risk for osteopenia and osteoporosis due to her Vitamin D deficiency. She was encouraged to take her Vitamin D and follow her higher calcium diet and increase strengthening exercise to help strengthen her bones and decrease her risk of osteopenia and osteoporosis.  Repetitive spaced learning was employed today to elicit superior memory formation and behavioral change.  4. Class 2 severe obesity with serious comorbidity and body mass index (BMI) of 38.0 to 38.9 in adult, unspecified obesity type (Robin Glover) Robin Glover is currently in the action stage of change. As such, her goal is to continue with weight loss efforts. She has agreed to the Category 2 Plan or the Robin Glover.   Exercise goals: As is. Robin Glover will look into adding swimming to her exercise more.  Behavioral modification strategies: increasing lean protein intake, decreasing simple carbohydrates, keeping healthy foods in the home and planning for success.  Robin Glover has agreed to follow-up with our clinic in 3 weeks. She  was informed of the importance of frequent follow-up visits to maximize her success with intensive lifestyle modifications for her multiple health conditions.   Objective:   Blood pressure 124/87, pulse 79, temperature 98.3 F (36.8 C), temperature source Oral, height 5\' 6"  (1.676 m), weight 235 lb  (106.6 kg), last menstrual period 03/14/2019, SpO2 99 %. Body mass index is 37.93 kg/m.  General: Cooperative, alert, well developed, in no acute distress. HEENT: Conjunctivae and lids unremarkable. Cardiovascular: Regular rhythm.  Lungs: Normal work of breathing. Neurologic: No focal deficits.   Lab Results  Component Value Date   CREATININE 0.68 02/09/2019   BUN 10 02/09/2019   NA 138 02/09/2019   K 4.3 02/09/2019   CL 102 02/09/2019   CO2 25 02/09/2019   Lab Results  Component Value Date   ALT 15 02/09/2019   AST 15 02/09/2019   ALKPHOS 63 02/09/2019   BILITOT 0.2 02/09/2019   Lab Results  Component Value Date   HGBA1C 5.8 (H) 02/09/2019   HGBA1C 6.0 (H) 01/15/2015   Lab Results  Component Value Date   INSULIN 12.5 02/09/2019   Lab Results  Component Value Date   TSH 2.986 01/15/2015   Lab Results  Component Value Date   CHOL 198 01/15/2015   HDL 45 (L) 01/15/2015   LDLCALC 132 (H) 01/15/2015   TRIG 105 01/15/2015   CHOLHDL 4.4 01/15/2015   Lab Results  Component Value Date   WBC 5.3 01/15/2015   HGB 13.6 01/15/2015   HCT 40.4 01/15/2015   MCV 88.8 01/15/2015   PLT 348 01/15/2015   No results found for: IRON, TIBC, FERRITIN  Attestation Statements:   Reviewed by clinician on day of visit: allergies, medications, problem list, medical history, surgical history, family history, social history, and previous encounter notes.   03/15/2015, am acting as Trude Mcburney for Energy manager, FNP-C.  I have reviewed the above documentation for accuracy and completeness, and I agree with the above. -  [place], FNP

## 2019-04-27 ENCOUNTER — Encounter (INDEPENDENT_AMBULATORY_CARE_PROVIDER_SITE_OTHER): Payer: Self-pay | Admitting: Family Medicine

## 2019-04-27 ENCOUNTER — Ambulatory Visit (INDEPENDENT_AMBULATORY_CARE_PROVIDER_SITE_OTHER): Payer: BC Managed Care – PPO | Admitting: Family Medicine

## 2019-04-27 ENCOUNTER — Other Ambulatory Visit: Payer: Self-pay

## 2019-04-27 VITALS — BP 117/78 | HR 76 | Temp 97.8°F | Ht 66.0 in | Wt 231.0 lb

## 2019-04-27 DIAGNOSIS — Z6837 Body mass index (BMI) 37.0-37.9, adult: Secondary | ICD-10-CM

## 2019-04-27 DIAGNOSIS — E1169 Type 2 diabetes mellitus with other specified complication: Secondary | ICD-10-CM

## 2019-04-28 ENCOUNTER — Other Ambulatory Visit: Payer: Self-pay

## 2019-05-02 ENCOUNTER — Encounter: Payer: Self-pay | Admitting: Women's Health

## 2019-05-02 ENCOUNTER — Ambulatory Visit (INDEPENDENT_AMBULATORY_CARE_PROVIDER_SITE_OTHER): Payer: BC Managed Care – PPO | Admitting: Women's Health

## 2019-05-02 ENCOUNTER — Encounter (INDEPENDENT_AMBULATORY_CARE_PROVIDER_SITE_OTHER): Payer: Self-pay | Admitting: Family Medicine

## 2019-05-02 ENCOUNTER — Other Ambulatory Visit: Payer: Self-pay

## 2019-05-02 VITALS — BP 122/78 | Ht 66.0 in | Wt 237.0 lb

## 2019-05-02 DIAGNOSIS — Z01419 Encounter for gynecological examination (general) (routine) without abnormal findings: Secondary | ICD-10-CM

## 2019-05-02 NOTE — Patient Instructions (Signed)
Pleasure knowing you!! Vit d 1000 iu daily Health Maintenance, Female Adopting a healthy lifestyle and getting preventive care are important in promoting health and wellness. Ask your health care provider about:  The right schedule for you to have regular tests and exams.  Things you can do on your own to prevent diseases and keep yourself healthy. What should I know about diet, weight, and exercise? Eat a healthy diet   Eat a diet that includes plenty of vegetables, fruits, low-fat dairy products, and lean protein.  Do not eat a lot of foods that are high in solid fats, added sugars, or sodium. Maintain a healthy weight Body mass index (BMI) is used to identify weight problems. It estimates body fat based on height and weight. Your health care provider can help determine your BMI and help you achieve or maintain a healthy weight. Get regular exercise Get regular exercise. This is one of the most important things you can do for your health. Most adults should:  Exercise for at least 150 minutes each week. The exercise should increase your heart rate and make you sweat (moderate-intensity exercise).  Do strengthening exercises at least twice a week. This is in addition to the moderate-intensity exercise.  Spend less time sitting. Even light physical activity can be beneficial. Watch cholesterol and blood lipids Have your blood tested for lipids and cholesterol at 43 years of age, then have this test every 5 years. Have your cholesterol levels checked more often if:  Your lipid or cholesterol levels are high.  You are older than 43 years of age.  You are at high risk for heart disease. What should I know about cancer screening? Depending on your health history and family history, you may need to have cancer screening at various ages. This may include screening for:  Breast cancer.  Cervical cancer.  Colorectal cancer.  Skin cancer.  Lung cancer. What should I know about  heart disease, diabetes, and high blood pressure? Blood pressure and heart disease  High blood pressure causes heart disease and increases the risk of stroke. This is more likely to develop in people who have high blood pressure readings, are of African descent, or are overweight.  Have your blood pressure checked: ? Every 3-5 years if you are 69-60 years of age. ? Every year if you are 107 years old or older. Diabetes Have regular diabetes screenings. This checks your fasting blood sugar level. Have the screening done:  Once every three years after age 45 if you are at a normal weight and have a low risk for diabetes.  More often and at a younger age if you are overweight or have a high risk for diabetes. What should I know about preventing infection? Hepatitis B If you have a higher risk for hepatitis B, you should be screened for this virus. Talk with your health care provider to find out if you are at risk for hepatitis B infection. Hepatitis C Testing is recommended for:  Everyone born from [birth year] through 1964.  Anyone with known risk factors for hepatitis C. Sexually transmitted infections (STIs)  Get screened for STIs, including gonorrhea and chlamydia, if: ? You are sexually active and are younger than 43 years of age. ? You are older than 43 years of age and your health care provider tells you that you are at risk for this type of infection. ? Your sexual activity has changed since you were last screened, and you are at increased risk for chlamydia  or gonorrhea. Ask your health care provider if you are at risk.  Ask your health care provider about whether you are at high risk for HIV. Your health care provider may recommend a prescription medicine to help prevent HIV infection. If you choose to take medicine to prevent HIV, you should first get tested for HIV. You should then be tested every 3 months for as long as you are taking the medicine. Pregnancy  If you are about to  stop having your period (premenopausal) and you may become pregnant, seek counseling before you get pregnant.  Take 400 to 800 micrograms (mcg) of folic acid every day if you become pregnant.  Ask for birth control (contraception) if you want to prevent pregnancy. Osteoporosis and menopause Osteoporosis is a disease in which the bones lose minerals and strength with aging. This can result in bone fractures. If you are 65 years old or older, or if you are at risk for osteoporosis and fractures, ask your health care provider if you should:  Be screened for bone loss.  Take a calcium or vitamin D supplement to lower your risk of fractures.  Be given hormone replacement therapy (HRT) to treat symptoms of menopause. Follow these instructions at home: Lifestyle  Do not use any products that contain nicotine or tobacco, such as cigarettes, e-cigarettes, and chewing tobacco. If you need help quitting, ask your health care provider.  Do not use street drugs.  Do not share needles.  Ask your health care provider for help if you need support or information about quitting drugs. Alcohol use  Do not drink alcohol if: ? Your health care provider tells you not to drink. ? You are pregnant, may be pregnant, or are planning to become pregnant.  If you drink alcohol: ? Limit how much you use to 0-1 drink a day. ? Limit intake if you are breastfeeding.  Be aware of how much alcohol is in your drink. In the [place] one drink equals one 12 oz bottle of beer (355 mL), one 5 oz glass of wine (148 mL), or one 1 oz glass of hard liquor (44 mL). General instructions  Schedule regular health, dental, and eye exams.  Stay current with your vaccines.  Tell your health care provider if: ? You often feel depressed. ? You have ever been abused or do not feel safe at home. Summary  Adopting a healthy lifestyle and getting preventive care are important in promoting health and wellness.  Follow your  health care provider's instructions about healthy diet, exercising, and getting tested or screened for diseases.  Follow your health care provider's instructions on monitoring your cholesterol and blood pressure. This information is not intended to replace advice given to you by your health care provider. Make sure you discuss any questions you have with your health care provider. Document Revised: 12/23/2017 Document Reviewed: 12/23/2017 Elsevier Patient Education  2020 Elsevier Inc.  

## 2019-05-02 NOTE — Progress Notes (Signed)
Robin Glover 22-Jan-1975 [id number]    History:    Presents for annual exam.  Monthly cycle, stopped OCs/not sexually active.  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  Primary care managing hypertension, diabetes and hypercholesteremia.  Has lost 35 pounds in the past year with diet and exercise and is hoping to continue weight loss journey.  Normal mammogram history.  Mother breast cancer survivor at age 43 did not have BRCA testing.  Negative STD screen since last sexual activity.  Past medical history, past surgical history, family history and social history were all reviewed and documented in the EPIC chart.  Works for [employer].  Daughter is 35 has had Gardasil.  Father diabetes on insulin, mother hypertension.  ROS:  A ROS was performed and pertinent positives and negatives are included.  Exam:  Vitals:   05/02/19 1550  BP: 122/78  Weight: 237 lb (107.5 kg)  Height: 5' 6"  (1.676 m)   Body mass index is 38.25 kg/m.   General appearance:  Normal Thyroid:  Symmetrical, normal in size, without palpable masses or nodularity. Respiratory  Auscultation:  Clear without wheezing or rhonchi Cardiovascular  Auscultation:  Regular rate, without rubs, murmurs or gallops  Edema/varicosities:  Not grossly evident Abdominal  Soft,nontender, without masses, guarding or rebound.  Liver/spleen:  No organomegaly noted  Hernia:  None appreciated  Skin  Inspection:  Grossly normal   Breasts: Examined lying and sitting.     Right: Without masses, retractions, discharge or axillary adenopathy.     Left: Without masses, retractions, discharge or axillary adenopathy. Gentitourinary   Inguinal/mons:  Normal without inguinal adenopathy  External genitalia:  Normal  BUS/Urethra/Skene's glands:  Normal  Vagina:  Normal  Cervix:  Normal  Uterus:   normal in size, shape and contour.  Midline and mobile  Adnexa/parametria:     Rt: Without masses or tenderness.   Lt: Without masses or  tenderness.  Anus and perineum: Normal  Digital rectal exam: Normal sphincter tone without palpated masses or tenderness  Assessment/Plan:  44 y.o. DBF G1, P1 for annual exam with no complaints of vaginal discharge, urinary symptoms, abdominal pain or GI changes.  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  SBEs, continue annual 3D screening mammogram, calcium rich foods, vitamin D 1000 IUs daily.  Contraception reviewed and declined will use condoms if becomes sexually active.  BRCA testing reviewed will discuss with her mother who has not had testing, breast cancer at age 68/survivor.  Will repeat Pap in a year normal Paps for 5 years.    Robin Glover, 4:33 PM 05/02/2019

## 2019-05-02 NOTE — Progress Notes (Signed)
Chief Complaint:   OBESITY Robin Glover is here to discuss her progress with her obesity treatment plan along with follow-up of her obesity related diagnoses. Robin Glover is on the Category 2 Plan or the Vegetarian Plan and states she is following her eating plan approximately 45-50% of the time. Robin Glover states she is doing zumba, walking, and bike riding for 45-60 minutes 4 times per week.  Today's visit was #: 6 Starting weight: 250 lbs Starting date: 02/09/2019 Today's weight: 231 lbs Today's date: 04/27/2019 Total lbs lost to date: 19 Total lbs lost since last in-office visit: 4  Interim History: Robin Glover reports not eating well on her recent trip to the beach, but she still lost 4 lbs. She is now back on the plan. She is a bit bored with the food options.  Subjective:   1. Type 2 diabetes mellitus with other specified complication, without long-term current use of insulin (HCC) Robin Glover is on Ozempic 0.25 mg daily. She feels this is helpful for her appetite. She notes metformin causes diarrhea. Last A1c was 5.8.  Assessment/Plan:   1. Type 2 diabetes mellitus with other specified complication, without long-term current use of insulin (HCC) Robin Glover agreed to continue Ozempic, and will follow up.  2. Class 2 severe obesity with serious comorbidity and body mass index (BMI) of 37.0 to 37.9 in adult, unspecified obesity type (HCC) Robin Glover is currently in the action stage of change. As such, her goal is to continue with weight loss efforts. She has agreed to keeping a food journal and adhering to recommended goals of 1100-1200 calories and 80 grams of protein daily.   Robin Glover may journal, and meal breakdown was provided.  Exercise goals: As is.  Behavioral modification strategies: meal planning and cooking strategies, planning for success and keeping a strict food journal.  Robin Glover has agreed to follow-up with our clinic in 2 to 3 weeks. She was informed of the importance of frequent follow-up  visits to maximize her success with intensive lifestyle modifications for her multiple health conditions.   Objective:   Blood pressure 117/78, pulse 76, temperature 97.8 F (36.6 C), temperature source Oral, height 5\' 6"  (1.676 m), weight 231 lb (104.8 kg), last menstrual period 04/21/2019, SpO2 99 %. Body mass index is 37.28 kg/m.  General: Cooperative, alert, well developed, in no acute distress. HEENT: Conjunctivae and lids unremarkable. Cardiovascular: Regular rhythm.  Lungs: Normal work of breathing. Neurologic: No focal deficits.   Lab Results  Component Value Date   CREATININE 0.68 02/09/2019   BUN 10 02/09/2019   NA 138 02/09/2019   K 4.3 02/09/2019   CL 102 02/09/2019   CO2 25 02/09/2019   Lab Results  Component Value Date   ALT 15 02/09/2019   AST 15 02/09/2019   ALKPHOS 63 02/09/2019   BILITOT 0.2 02/09/2019   Lab Results  Component Value Date   HGBA1C 5.8 (H) 02/09/2019   HGBA1C 6.0 (H) 01/15/2015   Lab Results  Component Value Date   INSULIN 12.5 02/09/2019   Lab Results  Component Value Date   TSH 2.986 01/15/2015   Lab Results  Component Value Date   CHOL 198 01/15/2015   HDL 45 (L) 01/15/2015   LDLCALC 132 (H) 01/15/2015   TRIG 105 01/15/2015   CHOLHDL 4.4 01/15/2015   Lab Results  Component Value Date   WBC 5.3 01/15/2015   HGB 13.6 01/15/2015   HCT 40.4 01/15/2015   MCV 88.8 01/15/2015   PLT 348 01/15/2015  No results found for: IRON, TIBC, FERRITIN  Attestation Statements:   Reviewed by clinician on day of visit: allergies, medications, problem list, medical history, surgical history, family history, social history, and previous encounter notes.   Wilhemena Durie, am acting as Location manager for [employer], FNP-C.  I have reviewed the above documentation for accuracy and completeness, and I agree with the above. -  Georgianne Fick, FNP

## 2019-05-18 ENCOUNTER — Ambulatory Visit (INDEPENDENT_AMBULATORY_CARE_PROVIDER_SITE_OTHER): Payer: BC Managed Care – PPO | Admitting: Family Medicine

## 2019-05-18 ENCOUNTER — Other Ambulatory Visit: Payer: Self-pay

## 2019-05-18 ENCOUNTER — Encounter (INDEPENDENT_AMBULATORY_CARE_PROVIDER_SITE_OTHER): Payer: Self-pay | Admitting: Family Medicine

## 2019-05-18 VITALS — BP 117/76 | HR 79 | Temp 98.0°F | Ht 66.0 in | Wt 229.0 lb

## 2019-05-18 DIAGNOSIS — E1169 Type 2 diabetes mellitus with other specified complication: Secondary | ICD-10-CM

## 2019-05-18 DIAGNOSIS — E559 Vitamin D deficiency, unspecified: Secondary | ICD-10-CM | POA: Diagnosis not present

## 2019-05-18 DIAGNOSIS — Z6837 Body mass index (BMI) 37.0-37.9, adult: Secondary | ICD-10-CM | POA: Diagnosis not present

## 2019-05-18 MED ORDER — VITAMIN D (ERGOCALCIFEROL) 1.25 MG (50000 UNIT) PO CAPS: 50000.0000 [IU] | ORAL_CAPSULE | ORAL | 0 refills | Status: DC

## 2019-05-19 NOTE — Progress Notes (Signed)
Chief Complaint:   OBESITY Robin Glover is here to discuss her progress with her obesity treatment plan along with follow-up of her obesity related diagnoses. Robin Glover is on keeping a food journal and adhering to recommended goals of 1100-1200 calories and 80 grams of protein daily and states she is following her eating plan approximately 60% of the time. Robin Glover states she is doing 0 minutes 0 times per week.  Today's visit was #: 7 Starting weight: 250 lbs Starting date: 02/09/2019 Today's weight: 229 lbs Today's date: 05/20/2019 Total lbs lost to date: 21 Total lbs lost since last in-office visit: 2  Interim History: Robin Glover did not journal due to stress from her father's recent emergency eye surgery. He is staying with her. She is surprised she lost weight. She is doing very well on the plan overall.  Subjective:   1. Vitamin D deficiency Robin Glover's last Vit D level was low at 13. She is on prescription Vit D.  2. Type 2 diabetes mellitus with other specified complication, without long-term current use of insulin (Robin Glover) Robin Glover's diabetes mellitus is well controlled on Ozempic. Her appetite is well controlled. Last A1c was 5.8.  Assessment/Plan:   1. Vitamin D deficiency Low Vitamin D level contributes to fatigue and are associated with obesity, breast, and colon cancer. We will refill prescription Vitamin D for 1 month. Robin Glover will follow-up for routine testing of Vitamin D, at least 2-3 times per year to avoid over-replacement.  - Vitamin D, Ergocalciferol, (DRISDOL) 1.25 MG (50000 UNIT) CAPS capsule; Take 1 capsule (50,000 Units total) by mouth every 7 (seven) days.  Dispense: 4 capsule; Refill: 0  2. Type 2 diabetes mellitus with other specified complication, without long-term current use of insulin (HCC) Good blood sugar control is important to decrease the likelihood of diabetic complications such as nephropathy, neuropathy, limb loss, blindness, coronary artery disease, and death.  Intensive lifestyle modification including diet, exercise and weight loss are the first line of treatment for diabetes. Robin Glover agreed to continue Ozempic, and will follow up as directed.  3. Class 2 severe obesity with serious comorbidity and body mass index (BMI) of 37.0 to 37.9 in adult, unspecified obesity type (HCC) Robin Glover is currently in the action stage of change. As such, her goal is to continue with weight loss efforts. She has agreed to the Category 2 Plan or keeping a food journal or adhering to recommended goals of 1100-1200 calories and 80 grams of protein daily.   Exercise goals: All adults should avoid inactivity. Some physical activity is better than none, and adults who participate in any amount of physical activity gain some health benefits.  Behavioral modification strategies: keeping a strict food journal.  Robin Glover has agreed to follow-up with our clinic in 3 weeks. She was informed of the importance of frequent follow-up visits to maximize her success with intensive lifestyle modifications for her multiple health conditions.   Objective:   Blood pressure 117/76, pulse 79, temperature 98 F (36.7 C), temperature source Oral, height 5\' 6"  (1.676 m), weight 229 lb (103.9 kg), last menstrual period 04/21/2019, SpO2 100 %. Body mass index is 36.96 kg/m.  General: Cooperative, alert, well developed, in no acute distress. HEENT: Conjunctivae and lids unremarkable. Cardiovascular: Regular rhythm.  Lungs: Normal work of breathing. Neurologic: No focal deficits.   Lab Results  Component Value Date   CREATININE 0.68 02/09/2019   BUN 10 02/09/2019   NA 138 02/09/2019   K 4.3 02/09/2019   CL 102 02/09/2019  CO2 25 02/09/2019   Lab Results  Component Value Date   ALT 15 02/09/2019   AST 15 02/09/2019   ALKPHOS 63 02/09/2019   BILITOT 0.2 02/09/2019   Lab Results  Component Value Date   HGBA1C 5.8 (H) 02/09/2019   HGBA1C 6.0 (H) 01/15/2015   Lab Results  Component  Value Date   INSULIN 12.5 02/09/2019   Lab Results  Component Value Date   TSH 2.986 01/15/2015   Lab Results  Component Value Date   CHOL 198 01/15/2015   HDL 45 (L) 01/15/2015   LDLCALC 132 (H) 01/15/2015   TRIG 105 01/15/2015   CHOLHDL 4.4 01/15/2015   Lab Results  Component Value Date   WBC 5.3 01/15/2015   HGB 13.6 01/15/2015   HCT 40.4 01/15/2015   MCV 88.8 01/15/2015   PLT 348 01/15/2015   No results found for: IRON, TIBC, FERRITIN  Attestation Statements:   Reviewed by clinician on day of visit: allergies, medications, problem list, medical history, surgical history, family history, social history, and previous encounter notes.   Trude Mcburney, am acting as Energy manager for [place], FNP-C.  I have reviewed the above documentation for accuracy and completeness, and I agree with the above. -  Jesse Sans, FNP

## 2019-06-09 ENCOUNTER — Encounter (INDEPENDENT_AMBULATORY_CARE_PROVIDER_SITE_OTHER): Payer: Self-pay | Admitting: Family Medicine

## 2019-06-09 ENCOUNTER — Ambulatory Visit (INDEPENDENT_AMBULATORY_CARE_PROVIDER_SITE_OTHER): Payer: BC Managed Care – PPO | Admitting: Family Medicine

## 2019-06-09 ENCOUNTER — Other Ambulatory Visit: Payer: Self-pay

## 2019-06-09 VITALS — BP 114/75 | HR 74 | Temp 97.9°F | Ht 66.0 in | Wt 227.0 lb

## 2019-06-09 DIAGNOSIS — F3289 Other specified depressive episodes: Secondary | ICD-10-CM

## 2019-06-09 DIAGNOSIS — Z9189 Other specified personal risk factors, not elsewhere classified: Secondary | ICD-10-CM | POA: Diagnosis not present

## 2019-06-09 DIAGNOSIS — E7849 Other hyperlipidemia: Secondary | ICD-10-CM

## 2019-06-09 DIAGNOSIS — E559 Vitamin D deficiency, unspecified: Secondary | ICD-10-CM | POA: Diagnosis not present

## 2019-06-09 DIAGNOSIS — E1169 Type 2 diabetes mellitus with other specified complication: Secondary | ICD-10-CM | POA: Diagnosis not present

## 2019-06-09 DIAGNOSIS — Z6836 Body mass index (BMI) 36.0-36.9, adult: Secondary | ICD-10-CM

## 2019-06-09 DIAGNOSIS — E119 Type 2 diabetes mellitus without complications: Secondary | ICD-10-CM | POA: Diagnosis not present

## 2019-06-09 DIAGNOSIS — R5383 Other fatigue: Secondary | ICD-10-CM

## 2019-06-09 MED ORDER — VITAMIN D (ERGOCALCIFEROL) 1.25 MG (50000 UNIT) PO CAPS: 50000.0000 [IU] | ORAL_CAPSULE | ORAL | 0 refills | Status: DC

## 2019-06-09 NOTE — Progress Notes (Signed)
Chief Complaint:   OBESITY Robin Glover is here to discuss her progress with her obesity treatment plan along with follow-up of her obesity related diagnoses. Robin Glover is on the Category 2 Plan and keeping a food journal and adhering to recommended goals of 1100-1200 calories and 80 grams of protein daily and states she is following her eating plan approximately 0% of the time. Robin Glover states she is walking for 30-40 minutes 2 times per week.  Today's visit was #: 8 Starting weight: 250 lbs Starting date: 02/09/2019 Today's weight: 227 lbs Today's date: 06/09/2019 Total lbs lost to date: 23 Total lbs lost since last in-office visit: 2  Interim History: Robin Glover notes she has been off the plan. She has been eating out too much. She is hard on herself and she feels she should have lost more. She notes increased stress eating.  Subjective:   1. Other fatigue Robin Glover notes fatigue recently. She has no history of anemia.  2. Type 2 diabetes mellitus without complication, without long-term current use of insulin (HCC) Robin Glover's diabetes mellitus is well controlled. She is on Ozempic 0.25 mg weekly.  Lab Results  Component Value Date   HGBA1C 5.8 (H) 02/09/2019   HGBA1C 6.0 (H) 01/15/2015   Lab Results  Component Value Date   LDLCALC 132 (H) 01/15/2015   CREATININE 0.68 02/09/2019   Lab Results  Component Value Date   INSULIN 12.5 02/09/2019   3. Vitamin D deficiency Robin Glover's last Vit D level was very low at 13. She is on prescription Vit D.  4. Other hyperlipidemia Robin Glover has hyperlipidemia and has been trying to improve her cholesterol levels with intensive lifestyle modification including a low saturated fat diet, exercise and weight loss. Robin Glover is on statin. Last LDL was high at 132, HDL was low at 45, and triglycerides were normal. She denies any chest pain, claudication or myalgias.  Lab Results  Component Value Date   ALT 15 02/09/2019   AST 15 02/09/2019   ALKPHOS 63  02/09/2019   BILITOT 0.2 02/09/2019   Lab Results  Component Value Date   CHOL 198 01/15/2015   HDL 45 (L) 01/15/2015   LDLCALC 132 (H) 01/15/2015   TRIG 105 01/15/2015   CHOLHDL 4.4 01/15/2015   5. Other depression with emotional eating  Robin Glover notes increased stress eating. She notes feeling somewhat down. She is not sleeping well.  6. At risk for depression Robin Glover is at elevated risk of depression due to one or more of the following: increased job stress and family stressors.  Assessment/Plan:   1. Other fatigue Robin Glover does feel that her weight is causing her energy to be lower than it should be. Fatigue may be related to obesity, depression or many other causes. Labs will be ordered, and in the meanwhile, Robin Glover will focus on self care including making healthy food choices, increasing physical activity and focusing on stress reduction.  - Comprehensive metabolic panel - CBC with Differential/Platelet - T3 - T4, free - TSH - Anemia panel  2. Type 2 diabetes mellitus without complication, without long-term current use of insulin (HCC) Good blood sugar control is important to decrease the likelihood of diabetic complications such as nephropathy, neuropathy, limb loss, blindness, coronary artery disease, and death. Intensive lifestyle modification including diet, exercise and weight loss are the first line of treatment for diabetes. Robin Glover will continue Ozempic at 0.25 mg weekly, and we will check labs today.  - Hemoglobin A1c - Insulin, random  3. Vitamin  D deficiency Low Vitamin D level contributes to fatigue and are associated with obesity, breast, and colon cancer. We will check labs today, and we will refill prescription Vitamin D for 1 month. Robin Glover will follow-up for routine testing of Vitamin D, at least 2-3 times per year to avoid over-replacement.  - VITAMIN D 25 Hydroxy (Vit-D Deficiency, Fractures)  - Vitamin D, Ergocalciferol, (DRISDOL) 1.25 MG (50000 UNIT) CAPS  capsule; Take 1 capsule (50,000 Units total) by mouth every 7 (seven) days.  Dispense: 4 capsule; Refill: 0  4. Other hyperlipidemia Cardiovascular risk and specific lipid/LDL goals reviewed. We discussed several lifestyle modifications today and Robin Glover will continue to work on diet, exercise and weight loss efforts. We will check labs today.  - Lipid Panel With LDL/HDL Ratio  5. Other depression with emotional eating  Behavior modification techniques were discussed today to help Robin Glover deal with her emotional/non-hunger eating behaviors. Robin Glover will consider calling [facility]. We discussed SSRI and she declined. She ahs been on a low dose of sertraline in the past. Orders and follow up as documented in patient record.   6. At risk for depression Robin Glover was given approximately 15 minutes of depression risk counseling today. She has risk factors for depression including job stress and family stress. We discussed the importance of a healthy work life balance, a healthy relationship with food and a good support system.  Repetitive spaced learning was employed today to elicit superior memory formation and behavioral change.  7. Class 2 severe obesity with serious comorbidity and body mass index (BMI) of 36.0 to 36.9 in adult, unspecified obesity type (HCC) Robin Glover is currently in the action stage of change. As such, her goal is to continue with weight loss efforts. She has agreed to the Category 2 Plan.   Exercise goals: As is.  Behavioral modification strategies: increasing lean protein intake, decreasing simple carbohydrates and decreasing eating out.  Robin Glover has agreed to follow-up with our clinic in 3 weeks. She was informed of the importance of frequent follow-up visits to maximize her success with intensive lifestyle modifications for her multiple health conditions.   Robin Glover was informed we would discuss her lab results at her next visit unless there is a critical issue that needs to be addressed  sooner. Robin Glover agreed to keep her next visit at the agreed upon time to discuss these results.  Objective:   Blood pressure 114/75, pulse 74, temperature 97.9 F (36.6 C), temperature source Oral, height 5\' 6"  (1.676 m), weight 227 lb (103 kg), SpO2 99 %. Body mass index is 36.64 kg/m.  General: Cooperative, alert, well developed, in no acute distress. HEENT: Conjunctivae and lids unremarkable. Cardiovascular: Regular rhythm.  Lungs: Normal work of breathing. Neurologic: No focal deficits.   Lab Results  Component Value Date   CREATININE 0.68 02/09/2019   BUN 10 02/09/2019   NA 138 02/09/2019   K 4.3 02/09/2019   CL 102 02/09/2019   CO2 25 02/09/2019   Lab Results  Component Value Date   ALT 15 02/09/2019   AST 15 02/09/2019   ALKPHOS 63 02/09/2019   BILITOT 0.2 02/09/2019   Lab Results  Component Value Date   HGBA1C 5.8 (H) 02/09/2019   HGBA1C 6.0 (H) 01/15/2015   Lab Results  Component Value Date   INSULIN 12.5 02/09/2019   Lab Results  Component Value Date   TSH 2.986 01/15/2015   Lab Results  Component Value Date   CHOL 198 01/15/2015   HDL 45 (L) 01/15/2015  LDLCALC 132 (H) 01/15/2015   TRIG 105 01/15/2015   CHOLHDL 4.4 01/15/2015   Lab Results  Component Value Date   WBC 5.3 01/15/2015   HGB 13.6 01/15/2015   HCT 40.4 01/15/2015   MCV 88.8 01/15/2015   PLT 348 01/15/2015   No results found for: IRON, TIBC, FERRITIN  Attestation Statements:   Reviewed by clinician on day of visit: allergies, medications, problem list, medical history, surgical history, family history, social history, and previous encounter notes.   Trude Mcburney, am acting as Energy manager for [place], FNP-C.  I have reviewed the above documentation for accuracy and completeness, and I agree with the above. -  Jesse Sans, FNP

## 2019-06-10 LAB — COMPREHENSIVE METABOLIC PANEL
ALT: 14 IU/L (ref 0–32)
AST: 16 IU/L (ref 0–40)
Albumin/Globulin Ratio: 1.7 (ref 1.2–2.2)
Albumin: 4.3 g/dL (ref 3.8–4.8)
Alkaline Phosphatase: 66 IU/L (ref 48–121)
BUN/Creatinine Ratio: 19 (ref 9–23)
BUN: 14 mg/dL (ref 6–24)
Bilirubin Total: 0.4 mg/dL (ref 0.0–1.2)
CO2: 22 mmol/L (ref 20–29)
Calcium: 9.5 mg/dL (ref 8.7–10.2)
Chloride: 102 mmol/L (ref 96–106)
Creatinine, Ser: 0.75 mg/dL (ref 0.57–1.00)
GFR calc Af Amer: 113 mL/min/{1.73_m2} (ref >59)
GFR calc non Af Amer: 98 mL/min/{1.73_m2} (ref >59)
Globulin, Total: 2.5 g/dL (ref 1.5–4.5)
Glucose: 89 mg/dL (ref 65–99)
Potassium: 4.4 mmol/L (ref 3.5–5.2)
Sodium: 137 mmol/L (ref 134–144)
Total Protein: 6.8 g/dL (ref 6.0–8.5)

## 2019-06-10 LAB — CBC WITH DIFFERENTIAL/PLATELET
Basophils Absolute: 0 10*3/uL (ref 0.0–0.2)
Basos: 1 %
EOS (ABSOLUTE): 0.1 10*3/uL (ref 0.0–0.4)
Eos: 1 %
Hemoglobin: 13 g/dL (ref 11.1–15.9)
Immature Grans (Abs): 0 10*3/uL (ref 0.0–0.1)
Immature Granulocytes: 0 %
Lymphocytes Absolute: 2.4 10*3/uL (ref 0.7–3.1)
Lymphs: 37 %
MCH: 30.4 pg (ref 26.6–33.0)
MCHC: 33.2 g/dL (ref 31.5–35.7)
MCV: 92 fL (ref 79–97)
Monocytes Absolute: 0.5 10*3/uL (ref 0.1–0.9)
Monocytes: 8 %
Neutrophils Absolute: 3.4 10*3/uL (ref 1.4–7.0)
Neutrophils: 53 %
Platelets: 287 10*3/uL (ref 150–450)
RBC: 4.27 x10E6/uL (ref 3.77–5.28)
RDW: 12.8 % (ref 11.7–15.4)
WBC: 6.5 10*3/uL (ref 3.4–10.8)

## 2019-06-10 LAB — ANEMIA PANEL
Ferritin: 73 ng/mL (ref 15–150)
Folate, Hemolysate: 278 ng/mL
Folate, RBC: 709 ng/mL (ref >498)
Hematocrit: 39.2 % (ref 34.0–46.6)
Iron Saturation: 25 % (ref 15–55)
Iron: 88 ug/dL (ref 27–159)
Retic Ct Pct: 1.9 % (ref 0.6–2.6)
Total Iron Binding Capacity: 351 ug/dL (ref 250–450)
UIBC: 263 ug/dL (ref 131–425)
Vitamin B-12: 513 pg/mL (ref 232–1245)

## 2019-06-10 LAB — LIPID PANEL WITH LDL/HDL RATIO
Cholesterol, Total: 145 mg/dL (ref 100–199)
HDL: 48 mg/dL (ref >39)
LDL Chol Calc (NIH): 87 mg/dL (ref 0–99)
LDL/HDL Ratio: 1.8 ratio (ref 0.0–3.2)
Triglycerides: 47 mg/dL (ref 0–149)
VLDL Cholesterol Cal: 10 mg/dL (ref 5–40)

## 2019-06-10 LAB — INSULIN, RANDOM: INSULIN: 13.7 u[IU]/mL (ref 2.6–24.9)

## 2019-06-10 LAB — T4, FREE: Free T4: 1.18 ng/dL (ref 0.82–1.77)

## 2019-06-10 LAB — TSH: TSH: 3.66 u[IU]/mL (ref 0.450–4.500)

## 2019-06-10 LAB — HEMOGLOBIN A1C
Est. average glucose Bld gHb Est-mCnc: 117 mg/dL
Hgb A1c MFr Bld: 5.7 % — ABNORMAL HIGH (ref 4.8–5.6)

## 2019-06-10 LAB — VITAMIN D 25 HYDROXY (VIT D DEFICIENCY, FRACTURES): Vit D, 25-Hydroxy: 36.3 ng/mL (ref 30.0–100.0)

## 2019-06-10 LAB — T3: T3, Total: 149 ng/dL (ref 71–180)

## 2019-06-30 ENCOUNTER — Ambulatory Visit (INDEPENDENT_AMBULATORY_CARE_PROVIDER_SITE_OTHER): Payer: BC Managed Care – PPO | Admitting: Family Medicine

## 2019-07-06 ENCOUNTER — Other Ambulatory Visit (INDEPENDENT_AMBULATORY_CARE_PROVIDER_SITE_OTHER): Payer: Self-pay | Admitting: Family Medicine

## 2019-07-06 DIAGNOSIS — E559 Vitamin D deficiency, unspecified: Secondary | ICD-10-CM

## 2019-07-12 DIAGNOSIS — R42 Dizziness and giddiness: Secondary | ICD-10-CM | POA: Diagnosis not present

## 2019-07-12 DIAGNOSIS — R5383 Other fatigue: Secondary | ICD-10-CM | POA: Diagnosis not present

## 2019-07-14 ENCOUNTER — Other Ambulatory Visit: Payer: Self-pay

## 2019-07-14 ENCOUNTER — Ambulatory Visit (INDEPENDENT_AMBULATORY_CARE_PROVIDER_SITE_OTHER): Payer: BC Managed Care – PPO | Admitting: Family Medicine

## 2019-07-14 ENCOUNTER — Encounter (INDEPENDENT_AMBULATORY_CARE_PROVIDER_SITE_OTHER): Payer: Self-pay | Admitting: Family Medicine

## 2019-07-14 VITALS — BP 114/76 | HR 74 | Temp 98.0°F | Ht 66.0 in | Wt 232.0 lb

## 2019-07-14 DIAGNOSIS — Z9189 Other specified personal risk factors, not elsewhere classified: Secondary | ICD-10-CM | POA: Diagnosis not present

## 2019-07-14 DIAGNOSIS — Z6837 Body mass index (BMI) 37.0-37.9, adult: Secondary | ICD-10-CM

## 2019-07-14 DIAGNOSIS — E559 Vitamin D deficiency, unspecified: Secondary | ICD-10-CM

## 2019-07-14 DIAGNOSIS — E66812 Obesity, class 2: Secondary | ICD-10-CM

## 2019-07-14 DIAGNOSIS — E119 Type 2 diabetes mellitus without complications: Secondary | ICD-10-CM | POA: Diagnosis not present

## 2019-07-14 MED ORDER — VITAMIN D (ERGOCALCIFEROL) 1.25 MG (50000 UNIT) PO CAPS: 50000.0000 [IU] | ORAL_CAPSULE | ORAL | 0 refills | Status: DC

## 2019-07-20 ENCOUNTER — Encounter (INDEPENDENT_AMBULATORY_CARE_PROVIDER_SITE_OTHER): Payer: Self-pay | Admitting: Family Medicine

## 2019-07-20 NOTE — Progress Notes (Signed)
Chief Complaint:   OBESITY Robin Glover is here to discuss her progress with her obesity treatment plan along with follow-up of her obesity related diagnoses. Robin Glover is on the Category 2 Plan and states she is following her eating plan approximately 20% of the time. Robin Glover states she is doing 0 minutes 0 times per week.  Today's visit was #: 9 Starting weight: 250 lbs Starting date: 02/09/2019 Today's weight: 232 lbs Today's date: 07/14/2019 Total lbs lost to date: 18 Total lbs lost since last in-office visit: 0  Interim History: Robin Glover and her daughter had COVID since her last office visit. She is better except for fatigue. She just started back on the plan this week.  Subjective:   1. Vitamin D deficiency Robin Glover's Vit D level has increased to 36.3 from 13.0. She is on prescription Vit D.  2. Type 2 diabetes mellitus without complication, without long-term current use of insulin (HCC) Robin Glover's diabetes mellitus is well controlled on Ozempic 0.5 mg weekly. She increased to 0.5 mg yesterday. She notes polyphagia. She is not checking her CBGs at home.  Lab Results  Component Value Date   HGBA1C 5.7 (H) 06/09/2019   HGBA1C 5.8 (H) 02/09/2019   HGBA1C 6.0 (H) 01/15/2015   Lab Results  Component Value Date   LDLCALC 87 06/09/2019   CREATININE 0.75 06/09/2019   Lab Results  Component Value Date   INSULIN 13.7 06/09/2019   INSULIN 12.5 02/09/2019   3. At risk for osteoporosis Robin Glover is at higher risk of osteopenia and osteoporosis due to Vitamin D deficiency.   Assessment/Plan:   1. Vitamin D deficiency Low Vitamin D level contributes to fatigue and are associated with obesity, breast, and colon cancer. We will refill prescription Vitamin D for 1 month. Robin Glover will follow-up for routine testing of Vitamin D, at least 2-3 times per year to avoid over-replacement.  - Vitamin D, Ergocalciferol, (DRISDOL) 1.25 MG (50000 UNIT) CAPS capsule; Take 1 capsule (50,000 Units total) by  mouth every 7 (seven) days.  Dispense: 12 capsule; Refill: 0  2. Type 2 diabetes mellitus without complication, without long-term current use of insulin (HCC) Good blood sugar control is important to decrease the likelihood of diabetic complications such as nephropathy, neuropathy, limb loss, blindness, coronary artery disease, and death. Intensive lifestyle modification including diet, exercise and weight loss are the first line of treatment for diabetes. Robin Glover will continue Ozempic at 0.5 mg weekly.  3. At risk for osteoporosis Robin Glover was given approximately 15 minutes of osteoporosis prevention counseling today. Robin Glover is at risk for osteopenia and osteoporosis due to her Vitamin D deficiency. She was encouraged to take her Vitamin D and follow her higher calcium diet and increase strengthening exercise to help strengthen her bones and decrease her risk of osteopenia and osteoporosis.  Repetitive spaced learning was employed today to elicit superior memory formation and behavioral change.  4. Class 2 severe obesity with serious comorbidity and body mass index (BMI) of 37.0 to 37.9 in adult, unspecified obesity type (HCC) Robin Glover is currently in the action stage of change. As such, her goal is to continue with weight loss efforts. She has agreed to the Category 2 Plan.   Exercise goals: No exercise has been prescribed at this time.  Behavioral modification strategies: increasing lean protein intake and decreasing simple carbohydrates.  She inquired about bariatric surgery. Information was provided to sign up for the bariatric surgery seminar [facility] Surgery seminar). Patient was advised that bariatric surgery  is a tool for weight loss and will not be successful long-term without significant dietary changes and exercise.   Robin Glover has agreed to follow-up with our clinic in 4 weeks. She was informed of the importance of frequent follow-up visits to maximize her success with  intensive lifestyle modifications for her multiple health conditions.   Objective:   Blood pressure 114/76, pulse 74, temperature 98 F (36.7 C), temperature source Oral, height 5\' 6"  (1.676 m), weight 232 lb (105.2 kg), last menstrual period 07/12/2019, SpO2 100 %. Body mass index is 37.45 kg/m.  General: Cooperative, alert, well developed, in no acute distress. HEENT: Conjunctivae and lids unremarkable. Cardiovascular: Regular rhythm.  Lungs: Normal work of breathing. Neurologic: No focal deficits.   Lab Results  Component Value Date   CREATININE 0.75 06/09/2019   BUN 14 06/09/2019   NA 137 06/09/2019   K 4.4 06/09/2019   CL 102 06/09/2019   CO2 22 06/09/2019   Lab Results  Component Value Date   ALT 14 06/09/2019   AST 16 06/09/2019   ALKPHOS 66 06/09/2019   BILITOT 0.4 06/09/2019   Lab Results  Component Value Date   HGBA1C 5.7 (H) 06/09/2019   HGBA1C 5.8 (H) 02/09/2019   HGBA1C 6.0 (H) 01/15/2015   Lab Results  Component Value Date   INSULIN 13.7 06/09/2019   INSULIN 12.5 02/09/2019   Lab Results  Component Value Date   TSH 3.660 06/09/2019   Lab Results  Component Value Date   CHOL 145 06/09/2019   HDL 48 06/09/2019   LDLCALC 87 06/09/2019   TRIG 47 06/09/2019   CHOLHDL 4.4 01/15/2015   Lab Results  Component Value Date   WBC 6.5 06/09/2019   HGB 13.0 06/09/2019   HCT 39.2 06/09/2019   MCV 92 06/09/2019   PLT 287 06/09/2019   Lab Results  Component Value Date   IRON 88 06/09/2019   TIBC 351 06/09/2019   FERRITIN 73 06/09/2019   Attestation Statements:   Reviewed by clinician on day of visit: allergies, medications, problem list, medical history, surgical history, family history, social history, and previous encounter notes.   06/11/2019, am acting as Trude Mcburney for Energy manager, FNP-C.  I have reviewed the above documentation for accuracy and completeness, and I agree with the above. -  [place], FNP

## 2019-08-11 ENCOUNTER — Other Ambulatory Visit: Payer: Self-pay

## 2019-08-11 ENCOUNTER — Ambulatory Visit (INDEPENDENT_AMBULATORY_CARE_PROVIDER_SITE_OTHER): Payer: BC Managed Care – PPO | Admitting: Family Medicine

## 2019-08-11 ENCOUNTER — Encounter (INDEPENDENT_AMBULATORY_CARE_PROVIDER_SITE_OTHER): Payer: Self-pay | Admitting: Family Medicine

## 2019-08-11 VITALS — BP 114/76 | HR 85 | Temp 98.1°F | Ht 66.0 in | Wt 231.0 lb

## 2019-08-11 DIAGNOSIS — E1169 Type 2 diabetes mellitus with other specified complication: Secondary | ICD-10-CM

## 2019-08-11 DIAGNOSIS — Z6837 Body mass index (BMI) 37.0-37.9, adult: Secondary | ICD-10-CM | POA: Diagnosis not present

## 2019-08-11 DIAGNOSIS — E66812 Obesity, class 2: Secondary | ICD-10-CM

## 2019-08-11 NOTE — Progress Notes (Signed)
Chief Complaint:   OBESITY Robin Glover is here to discuss her progress with her obesity treatment plan along with follow-up of her obesity related diagnoses. Martyna is on the Category 2 Plan and states she is following her eating plan approximately 30% of the time. Conni states she is doing 0 minutes 0 times per week.  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 She will be in [employer] play. She recently quit her job. She admits to stress eating. She is overeating extra calories. She finds herself getting french fries after play rehearsal.  Subjective:   1. Type 2 diabetes mellitus with other specified complication, without long-term current use of insulin (HCC) Tawana's diabetes mellitus is well controlled on Ozempic. She does not check her CBGs at home.  Lab Results  Component Value Date   HGBA1C 5.7 (H) 06/09/2019   HGBA1C 5.8 (H) 02/09/2019   HGBA1C 6.0 (H) 01/15/2015   Lab Results  Component Value Date   LDLCALC 87 06/09/2019   CREATININE 0.75 06/09/2019   Lab Results  Component Value Date   INSULIN 13.7 06/09/2019   INSULIN 12.5 02/09/2019   Assessment/Plan:   1. Type 2 diabetes mellitus with other specified complication, without long-term current use of insulin (HCC) Good blood sugar control is important to decrease the likelihood of diabetic complications such as nephropathy, neuropathy, limb loss, blindness, coronary artery disease, and death. Intensive lifestyle modification including diet, exercise and weight loss are the first line of treatment for diabetes. Kinsie will continue Ozempic and she will continue to follow up as directed.  2. Class 2 severe obesity with serious comorbidity and body mass index (BMI) of 37.0 to 37.9 in  adult, unspecified obesity type (HCC) Shakhia is currently in the action stage of change. As such, her goal is to continue with weight loss efforts. She has agreed to the Category 2 Plan.   Exercise goals: All adults should avoid inactivity. Some physical activity is better than none, and adults who participate in any amount of physical activity gain some health benefits.  Behavioral modification strategies: decreasing simple carbohydrates.  Azusena has agreed to follow-up with our clinic in 3 weeks. She was informed of the importance of frequent follow-up visits to maximize her success with intensive lifestyle modifications for her multiple health conditions.   Objective:   Blood pressure 114/76, pulse 85, temperature 98.1 F (36.7 C), temperature source Oral, height 5\' 6"  (1.676 m), weight (!) 231 lb (104.8 kg), last menstrual period 07/12/2019, SpO2 100 %. Body mass index is 37.28 kg/m.  General: Cooperative, alert, well developed, in no acute distress. HEENT: Conjunctivae and lids unremarkable. Cardiovascular: Regular rhythm.  Lungs: Normal work of breathing. Neurologic: No focal deficits.   Lab Results  Component Value Date   CREATININE 0.75 06/09/2019   BUN 14 06/09/2019   NA 137 06/09/2019   K 4.4 06/09/2019   CL 102 06/09/2019   CO2 22 06/09/2019   Lab Results  Component Value Date   ALT 14 06/09/2019   AST 16 06/09/2019   ALKPHOS 66 06/09/2019   BILITOT 0.4 06/09/2019   Lab Results  Component Value Date   HGBA1C 5.7 (H) 06/09/2019   HGBA1C 5.8 (H) 02/09/2019   HGBA1C 6.0 (H) 01/15/2015  Lab Results  Component Value Date   INSULIN 13.7 06/09/2019   INSULIN 12.5 02/09/2019   Lab Results  Component Value Date   TSH 3.660 06/09/2019   Lab Results  Component Value Date   CHOL 145 06/09/2019   HDL 48 06/09/2019   LDLCALC 87 06/09/2019   TRIG 47 06/09/2019   CHOLHDL 4.4 01/15/2015   Lab Results  Component Value Date   WBC 6.5 06/09/2019   HGB 13.0  06/09/2019   HCT 39.2 06/09/2019   MCV 92 06/09/2019   PLT 287 06/09/2019   Lab Results  Component Value Date   IRON 88 06/09/2019   TIBC 351 06/09/2019   FERRITIN 73 06/09/2019   Attestation Statements:   Reviewed by clinician on day of visit: allergies, medications, problem list, medical history, surgical history, family history, social history, and previous encounter notes.   Trude Mcburney, am acting as Energy manager for [place], FNP-C.  I have reviewed the above documentation for accuracy and completeness, and I agree with the above. -  Jesse Sans, FNP

## 2019-09-13 ENCOUNTER — Ambulatory Visit: Payer: BC Managed Care – PPO | Admitting: Neurology

## 2019-09-28 ENCOUNTER — Ambulatory Visit (INDEPENDENT_AMBULATORY_CARE_PROVIDER_SITE_OTHER): Payer: BC Managed Care – PPO | Admitting: Family Medicine

## 2019-09-28 ENCOUNTER — Other Ambulatory Visit: Payer: Self-pay

## 2019-09-28 ENCOUNTER — Encounter (INDEPENDENT_AMBULATORY_CARE_PROVIDER_SITE_OTHER): Payer: Self-pay | Admitting: Family Medicine

## 2019-09-28 VITALS — BP 124/84 | HR 80 | Temp 98.2°F | Ht 66.0 in | Wt 228.0 lb

## 2019-09-28 DIAGNOSIS — Z6836 Body mass index (BMI) 36.0-36.9, adult: Secondary | ICD-10-CM | POA: Diagnosis not present

## 2019-09-28 DIAGNOSIS — E559 Vitamin D deficiency, unspecified: Secondary | ICD-10-CM | POA: Diagnosis not present

## 2019-09-28 DIAGNOSIS — Z9189 Other specified personal risk factors, not elsewhere classified: Secondary | ICD-10-CM

## 2019-09-28 DIAGNOSIS — E1169 Type 2 diabetes mellitus with other specified complication: Secondary | ICD-10-CM | POA: Diagnosis not present

## 2019-09-28 MED ORDER — VITAMIN D (ERGOCALCIFEROL) 1.25 MG (50000 UNIT) PO CAPS: 50000.0000 [IU] | ORAL_CAPSULE | ORAL | 0 refills | Status: DC

## 2019-09-28 MED ORDER — OZEMPIC (0.25 OR 0.5 MG/DOSE) 2 MG/1.5ML ~~LOC~~ SOPN: 0.5000 mg | PEN_INJECTOR | SUBCUTANEOUS | 0 refills | Status: DC

## 2019-09-29 NOTE — Progress Notes (Signed)
Chief Complaint:   OBESITY Robin Glover is here to discuss her progress with her obesity treatment plan along with follow-up of her obesity related diagnoses. Robin Glover is on the Category 2 Plan and states she is following her eating plan approximately 60% of the time. Robin Glover states she is walking and dancing for 120 minutes 2-3 times per week.  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 She does not always eat all of the food but she does try to get the protein in. She is deviating from the plan at times but journaling did not work well for her. She has nuts or peanut butter rather than the meat at times.  Subjective:   1. Type 2 diabetes mellitus with other specified complication, without long-term current use of insulin (HCC) Robin Glover is on Ozempic 0.5 mg and she feels it suppresses her appetite. Her diabetes mellitus is well controlled. She has had rare hypoglycemia. She is not on metformin.  Lab Results  Component Value Date   HGBA1C 5.7 (H) 06/09/2019   HGBA1C 5.8 (H) 02/09/2019   HGBA1C 6.0 (H) 01/15/2015   Lab Results  Component Value Date   LDLCALC 87 06/09/2019   CREATININE 0.75 06/09/2019   Lab Results  Component Value Date   INSULIN 13.7 06/09/2019   INSULIN 12.5 02/09/2019   2. Vitamin D deficiency Robin Glover's last Vit D level was low at 36. She is on prescription Vit D.  3. At risk for deficient intake of food The patient is at a higher than average risk of deficient intake of food due to protein deficiency.  Assessment/Plan:   1. Type 2 diabetes mellitus with other specified complication, without long-term current use of insulin (HCC)  . Robin Glover agreed to continue her medications, and we will refill Ozempic for 1 month. We will recheck  labs at her next office visit.  - Semaglutide,0.25 or 0.5MG /DOS, (OZEMPIC, 0.25 OR 0.5 MG/DOSE,) 2 MG/1.5ML SOPN; Inject 0.5 mg into the skin once a week.  Dispense: 6 mL; Refill: 0  2. Vitamin D deficiency  We will refill prescription Vitamin D for 1 month.   - Vitamin D, Ergocalciferol, (DRISDOL) 1.25 MG (50000 UNIT) CAPS capsule; Take 1 capsule (50,000 Units total) by mouth every 7 (seven) days.  Dispense: 12 capsule; Refill: 0  3. At risk for deficient intake of food Robin Glover was given approximately 15 minutes of deficit intake of food prevention counseling today. Robin Glover is at risk for eating too little protein based on current food recall. She was encouraged to focus on meeting caloric and protein goals according to her recommended meal plan.   4. Class 2 severe obesity with serious comorbidity and body mass index (BMI) of 36.0 to 36.9 in adult, unspecified obesity type (HCC) Robin Glover is currently in the action stage of change. As such, her goal is to continue with weight loss efforts. She has agreed to the Category 2 Plan.   Exercise goals: As is.  Behavioral modification strategies: increasing lean protein intake.  Robin Glover has agreed to follow-up with our clinic in 3 weeks. She was informed of the importance of frequent follow-up visits to maximize her success with intensive lifestyle modifications for her multiple health conditions.   Objective:   Blood pressure  124/84, pulse 80, temperature 98.2 F (36.8 C), temperature source Oral, height 5\' 6"  (1.676 m), weight 228 lb (103.4 kg), SpO2 100 %. Body mass index is 36.8 kg/m.  General: Cooperative, alert, well developed, in no acute distress. HEENT: Conjunctivae and lids unremarkable. Cardiovascular: Regular rhythm.  Lungs: Normal work of breathing. Neurologic: No focal deficits.   Lab Results  Component Value Date   CREATININE 0.75 06/09/2019   BUN 14 06/09/2019   NA 137 06/09/2019   K 4.4 06/09/2019   CL 102 06/09/2019    CO2 22 06/09/2019   Lab Results  Component Value Date   ALT 14 06/09/2019   AST 16 06/09/2019   ALKPHOS 66 06/09/2019   BILITOT 0.4 06/09/2019   Lab Results  Component Value Date   HGBA1C 5.7 (H) 06/09/2019   HGBA1C 5.8 (H) 02/09/2019   HGBA1C 6.0 (H) 01/15/2015   Lab Results  Component Value Date   INSULIN 13.7 06/09/2019   INSULIN 12.5 02/09/2019   Lab Results  Component Value Date   TSH 3.660 06/09/2019   Lab Results  Component Value Date   CHOL 145 06/09/2019   HDL 48 06/09/2019   LDLCALC 87 06/09/2019   TRIG 47 06/09/2019   CHOLHDL 4.4 01/15/2015   Lab Results  Component Value Date   WBC 6.5 06/09/2019   HGB 13.0 06/09/2019   HCT 39.2 06/09/2019   MCV 92 06/09/2019   PLT 287 06/09/2019   Lab Results  Component Value Date   IRON 88 06/09/2019   TIBC 351 06/09/2019   FERRITIN 73 06/09/2019   Attestation Statements:   Reviewed by clinician on day of visit: allergies, medications, problem list, medical history, surgical history, family history, social history, and previous encounter notes.   06/11/2019, am acting as Trude Mcburney for Energy manager, FNP-C.  I have reviewed the above documentation for accuracy and completeness, and I agree with the above. -  [place], FNP

## 2019-10-03 ENCOUNTER — Encounter (INDEPENDENT_AMBULATORY_CARE_PROVIDER_SITE_OTHER): Payer: Self-pay | Admitting: Family Medicine

## 2019-10-19 ENCOUNTER — Ambulatory Visit (INDEPENDENT_AMBULATORY_CARE_PROVIDER_SITE_OTHER): Payer: BC Managed Care – PPO | Admitting: Family Medicine

## 2019-10-24 ENCOUNTER — Ambulatory Visit (INDEPENDENT_AMBULATORY_CARE_PROVIDER_SITE_OTHER): Payer: BC Managed Care – PPO | Admitting: Family Medicine

## 2019-10-26 DIAGNOSIS — Z1231 Encounter for screening mammogram for malignant neoplasm of breast: Secondary | ICD-10-CM | POA: Diagnosis not present

## 2019-10-31 ENCOUNTER — Other Ambulatory Visit: Payer: Self-pay

## 2019-10-31 ENCOUNTER — Ambulatory Visit (INDEPENDENT_AMBULATORY_CARE_PROVIDER_SITE_OTHER): Payer: BC Managed Care – PPO | Admitting: Family Medicine

## 2019-10-31 ENCOUNTER — Encounter (INDEPENDENT_AMBULATORY_CARE_PROVIDER_SITE_OTHER): Payer: Self-pay | Admitting: Family Medicine

## 2019-10-31 VITALS — BP 136/85 | HR 71 | Temp 98.3°F | Ht 66.0 in | Wt 233.0 lb

## 2019-10-31 DIAGNOSIS — F3289 Other specified depressive episodes: Secondary | ICD-10-CM | POA: Diagnosis not present

## 2019-10-31 DIAGNOSIS — E1169 Type 2 diabetes mellitus with other specified complication: Secondary | ICD-10-CM

## 2019-10-31 DIAGNOSIS — Z6837 Body mass index (BMI) 37.0-37.9, adult: Secondary | ICD-10-CM

## 2019-10-31 DIAGNOSIS — Z9189 Other specified personal risk factors, not elsewhere classified: Secondary | ICD-10-CM

## 2019-10-31 DIAGNOSIS — E559 Vitamin D deficiency, unspecified: Secondary | ICD-10-CM | POA: Diagnosis not present

## 2019-10-31 MED ORDER — OZEMPIC (0.25 OR 0.5 MG/DOSE) 2 MG/1.5ML ~~LOC~~ SOPN: 0.5000 mg | PEN_INJECTOR | SUBCUTANEOUS | 0 refills | Status: DC

## 2019-11-01 ENCOUNTER — Encounter (INDEPENDENT_AMBULATORY_CARE_PROVIDER_SITE_OTHER): Payer: Self-pay | Admitting: Family Medicine

## 2019-11-01 DIAGNOSIS — F32A Depression, unspecified: Secondary | ICD-10-CM | POA: Insufficient documentation

## 2019-11-01 LAB — INSULIN, RANDOM: INSULIN: 10.6 u[IU]/mL (ref 2.6–24.9)

## 2019-11-01 LAB — MICROALBUMIN / CREATININE URINE RATIO
Creatinine, Urine: 22.9 mg/dL
Microalb/Creat Ratio: <13 mg/g creat (ref 0–29)
Microalbumin, Urine: <3 ug/mL

## 2019-11-01 LAB — HEMOGLOBIN A1C
Est. average glucose Bld gHb Est-mCnc: 117 mg/dL
Hgb A1c MFr Bld: 5.7 % — ABNORMAL HIGH (ref 4.8–5.6)

## 2019-11-01 LAB — VITAMIN D 25 HYDROXY (VIT D DEFICIENCY, FRACTURES): Vit D, 25-Hydroxy: 39.6 ng/mL (ref 30.0–100.0)

## 2019-11-01 NOTE — Progress Notes (Signed)
Chief Complaint:   OBESITY Robin Glover is here to discuss her progress with her obesity treatment plan along with follow-up of her obesity related diagnoses. Robin Glover is on the Category 2 Plan and states she is following her eating plan approximately 40% of the time. Vicky states she is dancing 60 minutes 2 times per week.  Today's visit was #: 12 Starting weight: 250 lbs Starting date: 02/09/2019 Today's weight: 233 lbs Today's date: 10/31/2019 Total lbs lost to date: 17 Total lbs lost since last in-office visit: 0  Interim History: Robin Glover has been under a lot of stress this past month and notes significant stress eating. 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 She notes she is not planning ahead and often eats out after rehearsals.  Subjective:   Type 2 diabetes mellitus with other specified complication, without long-term current use of insulin (HCC). Diabetes is well controlled. She does note she has been out of Ozempic for 1 month due to availability at her pharmacy.   Lab Results  Component Value Date   HGBA1C 5.7 (H) 06/09/2019   HGBA1C 5.8 (H) 02/09/2019   Lab Results  Component Value Date   LDLCALC 87 06/09/2019   CREATININE 0.75 06/09/2019   Lab Results  Component Value Date   INSULIN 13.7 06/09/2019   INSULIN 12.5 02/09/2019   Vitamin D deficiency. Vitamin D level was low at 36.3 on 06/09/2019. Robin Glover is on prescription Vitamin D supplementation.    Ref. Range 06/09/2019 12:33  Vitamin D, 25-Hydroxy Latest Ref Range: 30.0 - 100.0 ng/mL 36.3   Other depression with emotional eating.  Robin Glover notes excessive stress eating over the past month due to multiple stressors with work, caring for elderly dad, and play rehearsals.  At risk for side effect of medication. Robin Glover is at risk of side effects from restarting Ozempic.  Assessment/Plan:   Type 2 diabetes mellitus with other specified complication,  without long-term current use of insulin (HCC). Refill was given for Semaglutide,0.25 or 0.5MG /DOS, (OZEMPIC, 0.25 OR 0.5 MG/DOSE,) 2 MG/1.5ML SOPN 0.5 SQ weekly. [name] was advised to take 0.25 mg for 2 weeks and then increase dose to 0.5 mg.  Vitamin D deficiency. She agrees to continue to take prescription Vitamin D as directed and VITAMIN D 25 Hydroxy (Vit-D Deficiency, Fractures) level will be checked today.   Other depression with emotional eating. . We discussed bupropion with [name] and she declined.  At risk for side effect of medication. Robin Glover was given approximately 15 minutes of drug side effect counseling today due to restart of Ozempic.  We discussed side effect possibility and risk versus benefits. Robin Glover agreed to the medication and will contact this office if these side effects are intolerable.  Repetitive spaced learning was employed today to elicit superior memory formation and behavioral change.  Class 2 severe obesity with serious comorbidity and body mass index (BMI) of 37.0 to 37.9 in adult, unspecified obesity type (HCC).  [name] is currently in the action stage of change. As such, her goal is to continue with weight loss efforts. She has agreed to the Category 2 Plan.   Exercise goals: Robin Glover will continue her current exercise regimen.   Behavioral modification strategies: decreasing simple carbohydrates, decreasing eating out and meal planning and cooking strategies.  Robin Glover has agreed to follow-up with our clinic in 3 weeks.  Robin Glover was informed we would discuss her lab results at her next visit  unless there is a critical issue that needs to be addressed sooner. Robin Glover agreed to keep her next visit at the agreed upon time to discuss these results.  Objective:   Blood pressure 136/85, pulse 71, temperature 98.3 F (36.8 C), height 5\' 6"  (1.676 m), weight 233 lb (105.7 kg), SpO2 100 %. Body mass index is 37.61 kg/m.  General: Cooperative, alert, well developed,  in no acute distress. HEENT: Conjunctivae and lids unremarkable. Cardiovascular: Regular rhythm.  Lungs: Normal work of breathing. Neurologic: No focal deficits.   Lab Results  Component Value Date   CREATININE 0.75 06/09/2019   BUN 14 06/09/2019   NA 137 06/09/2019   K 4.4 06/09/2019   CL 102 06/09/2019   CO2 22 06/09/2019   Lab Results  Component Value Date   ALT 14 06/09/2019   AST 16 06/09/2019   ALKPHOS 66 06/09/2019   BILITOT 0.4 06/09/2019   Lab Results  Component Value Date   HGBA1C 5.7 (H) 06/09/2019   HGBA1C 5.8 (H) 02/09/2019   HGBA1C 6.0 (H) 01/15/2015   Lab Results  Component Value Date   INSULIN 13.7 06/09/2019   INSULIN 12.5 02/09/2019   Lab Results  Component Value Date   TSH 3.660 06/09/2019   Lab Results  Component Value Date   CHOL 145 06/09/2019   HDL 48 06/09/2019   LDLCALC 87 06/09/2019   TRIG 47 06/09/2019   CHOLHDL 4.4 01/15/2015   Lab Results  Component Value Date   WBC 6.5 06/09/2019   HGB 13.0 06/09/2019   HCT 39.2 06/09/2019   MCV 92 06/09/2019   PLT 287 06/09/2019   Lab Results  Component Value Date   IRON 88 06/09/2019   TIBC 351 06/09/2019   FERRITIN 73 06/09/2019   Attestation Statements:   Reviewed by clinician on day of visit: allergies, medications, problem list, medical history, surgical history, family history, social history, and previous encounter notes.  I05/29/2021, am acting as Marianna Payment for Energy manager, FNP-C   I have reviewed the above documentation for accuracy and completeness, and I agree with the above. -  [place], FNP

## 2019-11-04 DIAGNOSIS — Z20822 Contact with and (suspected) exposure to covid-19: Secondary | ICD-10-CM | POA: Diagnosis not present

## 2019-11-09 ENCOUNTER — Encounter (INDEPENDENT_AMBULATORY_CARE_PROVIDER_SITE_OTHER): Payer: Self-pay

## 2019-11-11 ENCOUNTER — Encounter (INDEPENDENT_AMBULATORY_CARE_PROVIDER_SITE_OTHER): Payer: Self-pay | Admitting: Family Medicine

## 2019-11-14 NOTE — Telephone Encounter (Signed)
fyi

## 2019-11-23 ENCOUNTER — Ambulatory Visit (INDEPENDENT_AMBULATORY_CARE_PROVIDER_SITE_OTHER): Payer: BC Managed Care – PPO | Admitting: Family Medicine

## 2019-11-23 ENCOUNTER — Other Ambulatory Visit: Payer: Self-pay

## 2019-11-23 ENCOUNTER — Encounter (INDEPENDENT_AMBULATORY_CARE_PROVIDER_SITE_OTHER): Payer: Self-pay | Admitting: Family Medicine

## 2019-11-23 VITALS — BP 129/92 | HR 72 | Temp 97.9°F | Ht 66.0 in | Wt 232.0 lb

## 2019-11-23 DIAGNOSIS — E1169 Type 2 diabetes mellitus with other specified complication: Secondary | ICD-10-CM | POA: Diagnosis not present

## 2019-11-23 DIAGNOSIS — E559 Vitamin D deficiency, unspecified: Secondary | ICD-10-CM

## 2019-11-23 DIAGNOSIS — Z6837 Body mass index (BMI) 37.0-37.9, adult: Secondary | ICD-10-CM

## 2019-11-23 DIAGNOSIS — Z9189 Other specified personal risk factors, not elsewhere classified: Secondary | ICD-10-CM | POA: Diagnosis not present

## 2019-11-23 DIAGNOSIS — F3289 Other specified depressive episodes: Secondary | ICD-10-CM

## 2019-11-23 DIAGNOSIS — E66812 Obesity, class 2: Secondary | ICD-10-CM

## 2019-11-23 MED ORDER — VITAMIN D (ERGOCALCIFEROL) 1.25 MG (50000 UNIT) PO CAPS: 50000.0000 [IU] | ORAL_CAPSULE | ORAL | 0 refills | Status: DC

## 2019-11-23 MED ORDER — BUPROPION HCL ER (SR) 150 MG PO TB12: 150.0000 mg | ORAL_TABLET | Freq: Every day | ORAL | 0 refills | Status: DC

## 2019-11-23 MED ORDER — OZEMPIC (0.25 OR 0.5 MG/DOSE) 2 MG/1.5ML ~~LOC~~ SOPN: 0.5000 mg | PEN_INJECTOR | SUBCUTANEOUS | 0 refills | Status: DC

## 2019-11-24 ENCOUNTER — Telehealth (INDEPENDENT_AMBULATORY_CARE_PROVIDER_SITE_OTHER): Payer: Self-pay

## 2019-11-24 NOTE — Telephone Encounter (Signed)
Ozempic 0.25/0.5 approved 10/23/19-11/22/20

## 2019-11-24 NOTE — Progress Notes (Signed)
Chief Complaint:   OBESITY Robin Glover is here to discuss her progress with her obesity treatment plan along with follow-up of her obesity related diagnoses. Robin Glover is on the Category 2 Plan and states she is following her eating plan approximately 50-60% of the time. Robin Glover states she is dancing for 120 minutes 6 times per week.  Today's visit was #: 13 Starting weight: 250 lbs Starting date: 02/09/2019 Today's weight: 232 lbs Today's date: 11/23/2019 Total lbs lost to date: 18 Total lbs lost since last in-office visit: 1  Interim History: Robin Glover notices a difference in her appetite without  Ozempic. We have not been able to get insurance coverage. She is currently fasting (no meats or sweets), and she will stop this on November 20. She is working on getting protein in with eggs and dairy.   Subjective:   1. Type 2 diabetes mellitus with other specified complication, without long-term current use of insulin (HCC) Robin Glover's diabetes mellitus is well controlled. Her last A1c was 5.7. She is currently off Ozempic due to cost.  Lab Results  Component Value Date   HGBA1C 5.7 (H) 10/31/2019   HGBA1C 5.7 (H) 06/09/2019   HGBA1C 5.8 (H) 02/09/2019   Lab Results  Component Value Date   LDLCALC 87 06/09/2019   CREATININE 0.75 06/09/2019   Lab Results  Component Value Date   INSULIN 10.6 10/31/2019   INSULIN 13.7 06/09/2019   INSULIN 12.5 02/09/2019   2. Vitamin D deficiency Robin Glover Vit D level has only increased by 3 points on weekly 50,000 IU Vit D.  3. Other depression with emotional eating  Robin Glover notes she tends to stress eat especially on sweets and sodas. Her dad is aging and she is struggling with caring for him, he continues to live on his own. She is also working full time and rehearsing for a community play.  4. At risk for malnutrition Robin Glover is at increased risk for malnutrition due to vegetarian diet (fasting).  Assessment/Plan:   1. Type 2 diabetes mellitus with  other specified complication, without long-term current use of insulin (HCC)  We will file an appeal to get Ozempic covered, and we will refill Ozempic for 1 month.  - Semaglutide,0.25 or 0.5MG /DOS, (OZEMPIC, 0.25 OR 0.5 MG/DOSE,) 2 MG/1.5ML SOPN; Inject 0.5 mg into the skin once a week.  Dispense: 6 mL; Refill: 0  2. Vitamin D deficiency . Robin Glover agreed to increase prescription Vitamin D to 50,000 IU every 3 days with no refills.   - Vitamin D, Ergocalciferol, (DRISDOL) 1.25 MG (50000 UNIT) CAPS capsule; Take 1 capsule (50,000 Units total) by mouth every 3 (three) days.  Dispense: 30 capsule; Refill: 0  3. Other depression with emotional eating   We will refer to Behavioral Health to counseling. Robin Glover actually suggested this herself. Robin Glover agreed to start bupropion 150 mg q AM with no refills.  - buPROPion (WELLBUTRIN SR) 150 MG 12 hr tablet; Take 1 tablet (150 mg total) by mouth daily with breakfast.  Dispense: 30 tablet; Refill: 0 - Ambulatory referral to Behavioral Health  4. At risk for malnutrition Robin Glover was given approximately 15 minutes of counseling today regarding prevention of malnutrition and ways to meet macronutrient goals.   5. Class 2 severe obesity with serious comorbidity and body mass index (BMI) of 37.0 to 37.9 in adult, unspecified obesity type (HCC) Robin Glover is currently in the action stage of change. As such, her goal is to continue with weight loss efforts. She has agreed  to the Category 2 Plan or practicing portion control and making smarter food choices, such as increasing vegetables and decreasing simple carbohydrates.   Robin Glover will have protein at all meals.  Exercise goals: As is.  Behavioral modification strategies: increasing lean protein intake.  Robin Glover has agreed to follow-up with our clinic in 3 weeks.   Objective:   Blood pressure (!) 129/92, pulse 72, temperature 97.9 F (36.6 C), temperature source Oral, height 5\' 6"  (1.676 m), weight 232 lb  (105.2 kg), SpO2 100 %. Body mass index is 37.45 kg/m.  General: Cooperative, alert, well developed, in no acute distress. HEENT: Conjunctivae and lids unremarkable. Cardiovascular: Regular rhythm.  Lungs: Normal work of breathing. Neurologic: No focal deficits.   Lab Results  Component Value Date   CREATININE 0.75 06/09/2019   BUN 14 06/09/2019   NA 137 06/09/2019   K 4.4 06/09/2019   CL 102 06/09/2019   CO2 22 06/09/2019   Lab Results  Component Value Date   ALT 14 06/09/2019   AST 16 06/09/2019   ALKPHOS 66 06/09/2019   BILITOT 0.4 06/09/2019   Lab Results  Component Value Date   HGBA1C 5.7 (H) 10/31/2019   HGBA1C 5.7 (H) 06/09/2019   HGBA1C 5.8 (H) 02/09/2019   HGBA1C 6.0 (H) 01/15/2015   Lab Results  Component Value Date   INSULIN 10.6 10/31/2019   INSULIN 13.7 06/09/2019   INSULIN 12.5 02/09/2019   Lab Results  Component Value Date   TSH 3.660 06/09/2019   Lab Results  Component Value Date   CHOL 145 06/09/2019   HDL 48 06/09/2019   LDLCALC 87 06/09/2019   TRIG 47 06/09/2019   CHOLHDL 4.4 01/15/2015   Lab Results  Component Value Date   WBC 6.5 06/09/2019   HGB 13.0 06/09/2019   HCT 39.2 06/09/2019   MCV 92 06/09/2019   PLT 287 06/09/2019   Lab Results  Component Value Date   IRON 88 06/09/2019   TIBC 351 06/09/2019   FERRITIN 73 06/09/2019   Attestation Statements:   Reviewed by clinician on day of visit: allergies, medications, problem list, medical history, surgical history, family history, social history, and previous encounter notes.   06/11/2019, am acting as Trude Mcburney for Energy manager, FNP-C.  I have reviewed the above documentation for accuracy and completeness, and I agree with the above. -  [place], FNP

## 2019-11-28 ENCOUNTER — Encounter (INDEPENDENT_AMBULATORY_CARE_PROVIDER_SITE_OTHER): Payer: Self-pay | Admitting: Family Medicine

## 2019-12-14 ENCOUNTER — Encounter (INDEPENDENT_AMBULATORY_CARE_PROVIDER_SITE_OTHER): Payer: Self-pay | Admitting: Family Medicine

## 2019-12-14 ENCOUNTER — Ambulatory Visit (INDEPENDENT_AMBULATORY_CARE_PROVIDER_SITE_OTHER): Payer: BC Managed Care – PPO | Admitting: Family Medicine

## 2019-12-14 ENCOUNTER — Other Ambulatory Visit: Payer: Self-pay

## 2019-12-14 VITALS — BP 122/81 | HR 74 | Temp 98.1°F | Ht 66.0 in | Wt 234.0 lb

## 2019-12-14 DIAGNOSIS — G4700 Insomnia, unspecified: Secondary | ICD-10-CM | POA: Diagnosis not present

## 2019-12-14 DIAGNOSIS — F3289 Other specified depressive episodes: Secondary | ICD-10-CM | POA: Diagnosis not present

## 2019-12-14 DIAGNOSIS — Z6837 Body mass index (BMI) 37.0-37.9, adult: Secondary | ICD-10-CM

## 2019-12-14 NOTE — Progress Notes (Signed)
Chief Complaint:   OBESITY Robin Glover is here to discuss her progress with her obesity treatment plan along with follow-up of her obesity related diagnoses. Robin Glover is on the Category 2 Plan or practicing portion control and making smarter food choices, such as increasing vegetables and decreasing simple carbohydrates and states she is following her eating plan approximately 30% of the time. Robin Glover states she is dancing for 120 minutes 2 times per week.  Today's visit was #: 14 Starting weight: 250 lbs Starting date: 02/09/2019 Today's weight: 234 lbs Today's date: 12/14/2019 Total lbs lost to date: 16 lbs Total lbs lost since last in-office visit: 0  Interim History: Robin Glover is trying to make healthy choices, but is not always doing so.  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 She lacks motivation to stick to the plan.  She says she is stressed and unable to concentrate on the plan.  Subjective:   1. Insomnia, unspecified type Notes trouble staying asleep.  Positive for snoring.  She falls asleep very quickly.  Past diagnosis of mild OSA.  2. Other depression, with emotional eating She is under a lot of stress with family, work, and extracurricular activities.  She has not started bupropion.  She is concerned about side effects of suicidal tendencies.  Assessment/Plan:   1. Insomnia, unspecified type Schedule sleep study after the 1st of the year.  2. Other depression, with emotional eating She will give bupropion a try.  It was prescribed at last office visit. I discussed possible side effects with her and reassurance was provided.   3. Class 2 severe obesity with serious comorbidity and body mass index (BMI) of 37.0 to 37.9 in adult, unspecified obesity type (HCC)  Robin Glover is currently in the action stage of change. As such, her goal is to continue with weight loss efforts. She has agreed to practicing portion control and making smarter food choices, such as  increasing vegetables and decreasing simple carbohydrates and following a lower carbohydrate, vegetable and lean protein rich diet plan.   Discussed low carb and she would like to try it to "reset".  Exercise goals: As is.  Behavioral modification strategies: increasing lean protein intake and decreasing simple carbohydrates.  Robin Glover has agreed to follow-up with our clinic in 5 weeks.   Objective:   Blood pressure 122/81, pulse 74, temperature 98.1 F (36.7 C), height 5\' 6"  (1.676 m), weight 234 lb (106.1 kg), SpO2 100 %. Body mass index is 37.77 kg/m.  General: Cooperative, alert, well developed, in no acute distress. HEENT: Conjunctivae and lids unremarkable. Cardiovascular: Regular rhythm.  Lungs: Normal work of breathing. Neurologic: No focal deficits.   Lab Results  Component Value Date   CREATININE 0.75 06/09/2019   BUN 14 06/09/2019   NA 137 06/09/2019   K 4.4 06/09/2019   CL 102 06/09/2019   CO2 22 06/09/2019   Lab Results  Component Value Date   ALT 14 06/09/2019   AST 16 06/09/2019   ALKPHOS 66 06/09/2019   BILITOT 0.4 06/09/2019   Lab Results  Component Value Date   HGBA1C 5.7 (H) 10/31/2019   HGBA1C 5.7 (H) 06/09/2019   HGBA1C 5.8 (H) 02/09/2019   HGBA1C 6.0 (H) 01/15/2015   Lab Results  Component Value Date   INSULIN 10.6 10/31/2019   INSULIN 13.7 06/09/2019   INSULIN 12.5 02/09/2019   Lab Results  Component Value Date   TSH 3.660 06/09/2019   Lab Results  Component Value Date   CHOL 145 06/09/2019   HDL 48 06/09/2019   LDLCALC 87 06/09/2019   TRIG 47 06/09/2019   CHOLHDL 4.4 01/15/2015   Lab Results  Component Value Date   WBC 6.5 06/09/2019   HGB 13.0 06/09/2019   HCT 39.2 06/09/2019   MCV 92 06/09/2019   PLT 287 06/09/2019   Lab Results  Component Value Date   IRON 88 06/09/2019   TIBC 351 06/09/2019   FERRITIN 73 06/09/2019   Attestation Statements:   Reviewed by clinician on day of visit: allergies, medications, problem  list, medical history, surgical history, family history, social history, and previous encounter notes.  I, Insurance claims handler, CMA, am acting as Energy manager for [place], FNP.  I have reviewed the above documentation for accuracy and completeness, and I agree with the above. -  Jesse Sans, FNP

## 2019-12-30 ENCOUNTER — Other Ambulatory Visit (INDEPENDENT_AMBULATORY_CARE_PROVIDER_SITE_OTHER): Payer: Self-pay | Admitting: Family Medicine

## 2019-12-30 DIAGNOSIS — F3289 Other specified depressive episodes: Secondary | ICD-10-CM

## 2020-01-02 NOTE — Telephone Encounter (Signed)
This patient was last seen by Dawn Whitmire, FNP and currently has an upcoming appt scheduled on 01/19/20 with her.  

## 2020-01-02 NOTE — Telephone Encounter (Signed)
Refill request

## 2020-01-03 ENCOUNTER — Ambulatory Visit (INDEPENDENT_AMBULATORY_CARE_PROVIDER_SITE_OTHER): Payer: BC Managed Care – PPO | Admitting: Professional

## 2020-01-03 DIAGNOSIS — F4323 Adjustment disorder with mixed anxiety and depressed mood: Secondary | ICD-10-CM

## 2020-01-03 DIAGNOSIS — Z20828 Contact with and (suspected) exposure to other viral communicable diseases: Secondary | ICD-10-CM | POA: Diagnosis not present

## 2020-01-03 DIAGNOSIS — J101 Influenza due to other identified influenza virus with other respiratory manifestations: Secondary | ICD-10-CM | POA: Diagnosis not present

## 2020-01-10 DIAGNOSIS — Z20822 Contact with and (suspected) exposure to covid-19: Secondary | ICD-10-CM | POA: Diagnosis not present

## 2020-01-16 ENCOUNTER — Ambulatory Visit (INDEPENDENT_AMBULATORY_CARE_PROVIDER_SITE_OTHER): Payer: BC Managed Care – PPO | Admitting: Professional

## 2020-01-16 DIAGNOSIS — F4323 Adjustment disorder with mixed anxiety and depressed mood: Secondary | ICD-10-CM

## 2020-01-19 ENCOUNTER — Encounter (INDEPENDENT_AMBULATORY_CARE_PROVIDER_SITE_OTHER): Payer: Self-pay | Admitting: Family Medicine

## 2020-01-19 ENCOUNTER — Ambulatory Visit (INDEPENDENT_AMBULATORY_CARE_PROVIDER_SITE_OTHER): Payer: BC Managed Care – PPO | Admitting: Family Medicine

## 2020-01-19 ENCOUNTER — Other Ambulatory Visit: Payer: Self-pay

## 2020-01-19 VITALS — BP 134/86 | HR 85 | Temp 98.0°F | Ht 66.0 in | Wt 225.0 lb

## 2020-01-19 DIAGNOSIS — Z6836 Body mass index (BMI) 36.0-36.9, adult: Secondary | ICD-10-CM | POA: Diagnosis not present

## 2020-01-19 DIAGNOSIS — F3289 Other specified depressive episodes: Secondary | ICD-10-CM

## 2020-01-23 DIAGNOSIS — I10 Essential (primary) hypertension: Secondary | ICD-10-CM | POA: Diagnosis not present

## 2020-01-23 DIAGNOSIS — E8881 Metabolic syndrome: Secondary | ICD-10-CM | POA: Diagnosis not present

## 2020-01-23 DIAGNOSIS — E119 Type 2 diabetes mellitus without complications: Secondary | ICD-10-CM | POA: Diagnosis not present

## 2020-01-23 DIAGNOSIS — E785 Hyperlipidemia, unspecified: Secondary | ICD-10-CM | POA: Diagnosis not present

## 2020-01-24 NOTE — Progress Notes (Signed)
Chief Complaint:   OBESITY Robin Glover is here to discuss her progress with her obesity treatment plan along with follow-up of her obesity related diagnoses. Robin Glover is on following a lower carbohydrate, vegetable and lean protein rich diet plan and states she is following her eating plan approximately 95% of the time. Robin Glover states she is dancing 120 minutes 4 times per week.  Today's visit was #: 15 Starting weight: 250 lbs Starting date: 02/09/2019 Today's weight: 225 lbs Today's date: 01/19/2020 Total lbs lost to date: 25 lbs Total lbs lost since last in-office visit: 9 lbs  Interim History: Robin Glover is dong very well on the low carb plan. She is down 9 lbs and feeling much more in control of her eating.  Subjective:   1. Other depression, with emotional eating Robin Glover started the bupropion that was prescribed at last office visit. She feels like "it takes the edge off" and helps with cravings. Robin Glover has mild insomnia which is improving. Mood has stable and she feels much better. She has started therapy at [facility].   Assessment/Plan:   1. Other depression, with emotional eating  Robin Glover will continue bupropion.   2. Class 2 severe obesity with serious comorbidity and body mass index (BMI) of 36.0 to 36.9 in adult, unspecified obesity type (HCC)  Robin Glover is currently in the action stage of change. As such, her goal is to continue with weight loss efforts. She has agreed to following a lower carbohydrate, vegetable and lean protein rich diet plan.   Robin Glover may have sweet potato (1/2 cup) instead of beans on low carb plan. She was given recipe handout.  Exercise goals: As is.  Behavioral modification strategies: meal planning and cooking strategies and planning for success.  Robin Glover has agreed to follow-up with our clinic in 3 weeks.  Objective:   Blood pressure 134/86, pulse 85, temperature 98 F (36.7 C), height 5\' 6"  (1.676 m), weight 225 lb (102.1 kg), SpO2 100 %. Body mass  index is 36.32 kg/m.  General: Cooperative, alert, well developed, in no acute distress. HEENT: Conjunctivae and lids unremarkable. Cardiovascular: Regular rhythm.  Lungs: Normal work of breathing. Neurologic: No focal deficits.   Lab Results  Component Value Date   CREATININE 0.75 06/09/2019   BUN 14 06/09/2019   NA 137 06/09/2019   K 4.4 06/09/2019   CL 102 06/09/2019   CO2 22 06/09/2019   Lab Results  Component Value Date   ALT 14 06/09/2019   AST 16 06/09/2019   ALKPHOS 66 06/09/2019   BILITOT 0.4 06/09/2019   Lab Results  Component Value Date   HGBA1C 5.7 (H) 10/31/2019   HGBA1C 5.7 (H) 06/09/2019   HGBA1C 5.8 (H) 02/09/2019   HGBA1C 6.0 (H) 01/15/2015   Lab Results  Component Value Date   INSULIN 10.6 10/31/2019   INSULIN 13.7 06/09/2019   INSULIN 12.5 02/09/2019   Lab Results  Component Value Date   TSH 3.660 06/09/2019   Lab Results  Component Value Date   CHOL 145 06/09/2019   HDL 48 06/09/2019   LDLCALC 87 06/09/2019   TRIG 47 06/09/2019   CHOLHDL 4.4 01/15/2015   Lab Results  Component Value Date   WBC 6.5 06/09/2019   HGB 13.0 06/09/2019   HCT 39.2 06/09/2019   MCV 92 06/09/2019   PLT 287 06/09/2019   Lab Results  Component Value Date   IRON 88 06/09/2019   TIBC 351 06/09/2019   FERRITIN 73 06/09/2019  Attestation Statements:   Reviewed by clinician on day of visit: allergies, medications, problem list, medical history, surgical history, family history, social history, and previous encounter notes.   IDelorse Limber, am acting as Energy manager for [employer], FNP.  I have reviewed the above documentation for accuracy and completeness, and I agree with the above. -  Jesse Sans, FNP

## 2020-01-30 ENCOUNTER — Ambulatory Visit (INDEPENDENT_AMBULATORY_CARE_PROVIDER_SITE_OTHER): Payer: BC Managed Care – PPO | Admitting: Professional

## 2020-01-30 DIAGNOSIS — F4323 Adjustment disorder with mixed anxiety and depressed mood: Secondary | ICD-10-CM | POA: Diagnosis not present

## 2020-01-31 ENCOUNTER — Other Ambulatory Visit (INDEPENDENT_AMBULATORY_CARE_PROVIDER_SITE_OTHER): Payer: Self-pay | Admitting: Adult Health

## 2020-01-31 DIAGNOSIS — F3289 Other specified depressive episodes: Secondary | ICD-10-CM

## 2020-02-06 ENCOUNTER — Ambulatory Visit: Payer: BC Managed Care – PPO | Admitting: Professional

## 2020-02-08 ENCOUNTER — Ambulatory Visit (INDEPENDENT_AMBULATORY_CARE_PROVIDER_SITE_OTHER): Payer: BC Managed Care – PPO | Admitting: Family Medicine

## 2020-02-08 DIAGNOSIS — Z23 Encounter for immunization: Secondary | ICD-10-CM | POA: Diagnosis not present

## 2020-02-14 ENCOUNTER — Ambulatory Visit (INDEPENDENT_AMBULATORY_CARE_PROVIDER_SITE_OTHER): Payer: BC Managed Care – PPO | Admitting: Professional

## 2020-02-14 DIAGNOSIS — F4310 Post-traumatic stress disorder, unspecified: Secondary | ICD-10-CM

## 2020-02-16 ENCOUNTER — Encounter (INDEPENDENT_AMBULATORY_CARE_PROVIDER_SITE_OTHER): Payer: Self-pay | Admitting: Family Medicine

## 2020-02-16 ENCOUNTER — Ambulatory Visit (INDEPENDENT_AMBULATORY_CARE_PROVIDER_SITE_OTHER): Payer: BC Managed Care – PPO | Admitting: Family Medicine

## 2020-02-16 ENCOUNTER — Other Ambulatory Visit: Payer: Self-pay

## 2020-02-16 VITALS — BP 118/76 | HR 83 | Temp 97.9°F | Ht 66.0 in | Wt 220.0 lb

## 2020-02-16 DIAGNOSIS — Z6835 Body mass index (BMI) 35.0-35.9, adult: Secondary | ICD-10-CM

## 2020-02-16 DIAGNOSIS — F3289 Other specified depressive episodes: Secondary | ICD-10-CM

## 2020-02-16 DIAGNOSIS — E1169 Type 2 diabetes mellitus with other specified complication: Secondary | ICD-10-CM | POA: Diagnosis not present

## 2020-02-20 ENCOUNTER — Encounter (INDEPENDENT_AMBULATORY_CARE_PROVIDER_SITE_OTHER): Payer: Self-pay | Admitting: Family Medicine

## 2020-02-20 ENCOUNTER — Ambulatory Visit (INDEPENDENT_AMBULATORY_CARE_PROVIDER_SITE_OTHER): Payer: BC Managed Care – PPO | Admitting: Family Medicine

## 2020-02-20 ENCOUNTER — Ambulatory Visit (INDEPENDENT_AMBULATORY_CARE_PROVIDER_SITE_OTHER): Payer: BC Managed Care – PPO | Admitting: Professional

## 2020-02-20 DIAGNOSIS — F4323 Adjustment disorder with mixed anxiety and depressed mood: Secondary | ICD-10-CM

## 2020-02-20 NOTE — Progress Notes (Signed)
Chief Complaint:   OBESITY Robin Glover is here to discuss her progress with her obesity treatment plan along with follow-up of her obesity related diagnoses. [place] is on following a lower carbohydrate, vegetable and lean protein rich diet plan and states she is following her eating plan approximately 90% of the time. [name] states she is doing 0 minutes 0 times per week.  Today's visit was #: 16 Starting weight: 250 lbs Starting date: 02/09/2019 Today's weight: 220 lbs Today's date: 02/16/2020 Total lbs lost to date: 30 Total lbs lost since last in-office visit: 5  Interim History: [name] is doing well on her plan despite a lot of stress due to family issues (caring for elderly father). She has indulged in some sweets but she notes it didn't taste that great to her. She then gets back on her plan. She is down 30 lbs.  Subjective:   1. Other depression, with emotional eating [name] notes she has sweet craving. Her mood is stable, and she feels bupropion helps with cravings.  2. Type 2 diabetes mellitus with other specified complication, without long-term current use of insulin (HCC) Brian's diabetes mellitus is well controlled. Her last A1c was 5.7. She is not checking her CBGs at home. She is having some constipation, and she notes she has adequate vegetable and water intake.  Lab Results  Component Value Date   HGBA1C 5.7 (H) 10/31/2019   HGBA1C 5.7 (H) 06/09/2019   HGBA1C 5.8 (H) 02/09/2019   Lab Results  Component Value Date   LDLCALC 87 06/09/2019   CREATININE 0.75 06/09/2019   Lab Results  Component Value Date   INSULIN 10.6 10/31/2019   INSULIN 13.7 06/09/2019   INSULIN 12.5 02/09/2019   Assessment/Plan:   1. Other depression, with emotional eating  [name] will continue bupropion.   2. Type 2 diabetes mellitus with other specified complication, without long-term current use of insulin (HCC) [name] will continue Ozempic, and she is to take OTC miralax as needed  and may take it daily.  3. Class 2 severe obesity with serious comorbidity and body mass index (BMI) of 35.0 to 35.9 in adult, unspecified obesity type (HCC) [name] is currently in the action stage of change. As such, her goal is to continue with weight loss efforts. She has agreed to following a lower carbohydrate, vegetable and lean protein rich diet plan.   Exercise goals: [name] is planning some exercise. She bought a pilates bar and infinity hoop.  Behavioral modification strategies: decreasing simple carbohydrates and better snacking choices.  [name] has agreed to follow-up with our clinic in 3 weeks..   Objective:   Blood pressure 118/76, pulse 83, temperature 97.9 F (36.6 C), height 5\' 6"  (1.676 m), weight 220 lb (99.8 kg), SpO2 99 %. Body mass index is 35.51 kg/m.  General: Cooperative, alert, well developed, in no acute distress. HEENT: Conjunctivae and lids unremarkable. Cardiovascular: Regular rhythm.  Lungs: Normal work of breathing. Neurologic: No focal deficits.   Lab Results  Component Value Date   CREATININE 0.75 06/09/2019   BUN 14 06/09/2019   NA 137 06/09/2019   K 4.4 06/09/2019   CL 102 06/09/2019   CO2 22 06/09/2019   Lab Results  Component Value Date   ALT 14 06/09/2019   AST 16 06/09/2019   ALKPHOS 66 06/09/2019   BILITOT 0.4 06/09/2019   Lab Results  Component Value Date   HGBA1C 5.7 (H) 10/31/2019   HGBA1C 5.7 (H) 06/09/2019   HGBA1C 5.8 (H)  02/09/2019   HGBA1C 6.0 (H) 01/15/2015   Lab Results  Component Value Date   INSULIN 10.6 10/31/2019   INSULIN 13.7 06/09/2019   INSULIN 12.5 02/09/2019   Lab Results  Component Value Date   TSH 3.660 06/09/2019   Lab Results  Component Value Date   CHOL 145 06/09/2019   HDL 48 06/09/2019   LDLCALC 87 06/09/2019   TRIG 47 06/09/2019   CHOLHDL 4.4 01/15/2015   Lab Results  Component Value Date   WBC 6.5 06/09/2019   HGB 13.0 06/09/2019   HCT 39.2 06/09/2019   MCV 92 06/09/2019   PLT  287 06/09/2019   Lab Results  Component Value Date   IRON 88 06/09/2019   TIBC 351 06/09/2019   FERRITIN 73 06/09/2019   Attestation Statements:   Reviewed by clinician on day of visit: allergies, medications, problem list, medical history, surgical history, family history, social history, and previous encounter notes.   Trude Mcburney, am acting as Energy manager for [employer], FNP-C.  I have reviewed the above documentation for accuracy and completeness, and I agree with the above. -  Jesse Sans, FNP

## 2020-02-27 ENCOUNTER — Ambulatory Visit (INDEPENDENT_AMBULATORY_CARE_PROVIDER_SITE_OTHER): Payer: BC Managed Care – PPO | Admitting: Professional

## 2020-02-27 DIAGNOSIS — F4323 Adjustment disorder with mixed anxiety and depressed mood: Secondary | ICD-10-CM | POA: Diagnosis not present

## 2020-03-05 ENCOUNTER — Ambulatory Visit (INDEPENDENT_AMBULATORY_CARE_PROVIDER_SITE_OTHER): Payer: BC Managed Care – PPO | Admitting: Professional

## 2020-03-05 DIAGNOSIS — F4323 Adjustment disorder with mixed anxiety and depressed mood: Secondary | ICD-10-CM

## 2020-03-07 ENCOUNTER — Other Ambulatory Visit: Payer: Self-pay

## 2020-03-07 ENCOUNTER — Encounter (INDEPENDENT_AMBULATORY_CARE_PROVIDER_SITE_OTHER): Payer: Self-pay | Admitting: Family Medicine

## 2020-03-07 ENCOUNTER — Ambulatory Visit (INDEPENDENT_AMBULATORY_CARE_PROVIDER_SITE_OTHER): Payer: BC Managed Care – PPO | Admitting: Family Medicine

## 2020-03-07 VITALS — BP 127/83 | HR 80 | Temp 98.0°F | Ht 66.0 in | Wt 223.0 lb

## 2020-03-07 DIAGNOSIS — Z6836 Body mass index (BMI) 36.0-36.9, adult: Secondary | ICD-10-CM | POA: Diagnosis not present

## 2020-03-07 DIAGNOSIS — E1169 Type 2 diabetes mellitus with other specified complication: Secondary | ICD-10-CM

## 2020-03-08 ENCOUNTER — Encounter (INDEPENDENT_AMBULATORY_CARE_PROVIDER_SITE_OTHER): Payer: Self-pay | Admitting: Family Medicine

## 2020-03-08 NOTE — Progress Notes (Signed)
Chief Complaint:   OBESITY Robin Glover is here to discuss her progress with her obesity treatment plan along with follow-up of her obesity related diagnoses. Robin Glover is on following a lower carbohydrate, vegetable and lean protein rich diet plan and states she is following her eating plan approximately 50% of the time. Robin Glover states she is doing 0 minutes 0 times per week.  Today's visit was #: 17 Starting weight: 250 lbs Starting date: 02/09/2019 Today's weight: 223 lbs Today's date: 03/07/2020 Total lbs lost to date: 27 lbs Total lbs lost since last in-office visit: 0  Interim History: Robin Glover is up 3 lbs today. She knows she has been off the plan but wants to continue trying to do the low carb. She occasionally skips meals. She feels ready to get back on plan but says she needs to grocery shop. She feels stress has gotten her off plan.  Subjective:   1. Type 2 diabetes mellitus with other specified complication, without long-term current use of insulin (HCC) Robin Glover is well controlled. Her last A1c was 5.7. Her appetite is fairly well controlled. She is on Ozempic.  Lab Results  Component Value Date   HGBA1C 5.7 (H) 10/31/2019   HGBA1C 5.7 (H) 06/09/2019   HGBA1C 5.8 (H) 02/09/2019   Lab Results  Component Value Date   LDLCALC 87 06/09/2019   CREATININE 0.75 06/09/2019   Lab Results  Component Value Date   INSULIN 10.6 10/31/2019   INSULIN 13.7 06/09/2019   INSULIN 12.5 02/09/2019    Assessment/Plan:   1. Type 2 diabetes mellitus with other specified complication, without long-term current use of insulin (HCC)  Continue Ozempic.  2. Class 2 severe obesity with serious comorbidity and body mass index (BMI) of 36.0 to 36.9 in adult, unspecified obesity type (HCC) Robin Glover is currently in the action stage of change. As such, her goal is to continue with weight loss efforts. She has agreed to following a lower carbohydrate, vegetable and lean protein rich diet plan.    Exercise goals: Start using exercise equipment 2 times a week.  Behavioral modification strategies: decreasing simple carbohydrates.  Robin Glover has agreed to follow-up with our clinic in 3 weeks.   Objective:   Blood pressure 127/83, pulse 80, temperature 98 F (36.7 C), height 5\' 6"  (1.676 m), weight 223 lb (101.2 kg), SpO2 96 %. Body mass index is 35.99 kg/m.  General: Cooperative, alert, well developed, in no acute distress. HEENT: Conjunctivae and lids unremarkable. Cardiovascular: Regular rhythm.  Lungs: Normal work of breathing. Neurologic: No focal deficits.   Lab Results  Component Value Date   CREATININE 0.75 06/09/2019   BUN 14 06/09/2019   NA 137 06/09/2019   K 4.4 06/09/2019   CL 102 06/09/2019   CO2 22 06/09/2019   Lab Results  Component Value Date   ALT 14 06/09/2019   AST 16 06/09/2019   ALKPHOS 66 06/09/2019   BILITOT 0.4 06/09/2019   Lab Results  Component Value Date   HGBA1C 5.7 (H) 10/31/2019   HGBA1C 5.7 (H) 06/09/2019   HGBA1C 5.8 (H) 02/09/2019   HGBA1C 6.0 (H) 01/15/2015   Lab Results  Component Value Date   INSULIN 10.6 10/31/2019   INSULIN 13.7 06/09/2019   INSULIN 12.5 02/09/2019   Lab Results  Component Value Date   TSH 3.660 06/09/2019   Lab Results  Component Value Date   CHOL 145 06/09/2019   HDL 48 06/09/2019   LDLCALC 87 06/09/2019   TRIG 47 06/09/2019  CHOLHDL 4.4 01/15/2015   Lab Results  Component Value Date   WBC 6.5 06/09/2019   HGB 13.0 06/09/2019   HCT 39.2 06/09/2019   MCV 92 06/09/2019   PLT 287 06/09/2019   Lab Results  Component Value Date   IRON 88 06/09/2019   TIBC 351 06/09/2019   FERRITIN 73 06/09/2019    Attestation Statements:   Reviewed by clinician on day of visit: allergies, medications, problem list, medical history, surgical history, family history, social history, and previous encounter notes.  Edmund Hilda, am acting as Energy manager for [employer], FNP.  I have  reviewed the above documentation for accuracy and completeness, and I agree with the above. -  Jesse Sans, FNP

## 2020-03-12 ENCOUNTER — Ambulatory Visit (INDEPENDENT_AMBULATORY_CARE_PROVIDER_SITE_OTHER): Payer: BC Managed Care – PPO | Admitting: Professional

## 2020-03-12 DIAGNOSIS — F4323 Adjustment disorder with mixed anxiety and depressed mood: Secondary | ICD-10-CM

## 2020-03-19 ENCOUNTER — Ambulatory Visit: Payer: BC Managed Care – PPO | Admitting: Professional

## 2020-03-19 ENCOUNTER — Other Ambulatory Visit (INDEPENDENT_AMBULATORY_CARE_PROVIDER_SITE_OTHER): Payer: Self-pay | Admitting: Family Medicine

## 2020-03-19 DIAGNOSIS — E559 Vitamin D deficiency, unspecified: Secondary | ICD-10-CM

## 2020-03-19 NOTE — Telephone Encounter (Signed)
Refill request

## 2020-03-19 NOTE — Telephone Encounter (Signed)
Pt seen Dawn last

## 2020-03-22 ENCOUNTER — Other Ambulatory Visit (INDEPENDENT_AMBULATORY_CARE_PROVIDER_SITE_OTHER): Payer: Self-pay | Admitting: Adult Health

## 2020-03-22 ENCOUNTER — Encounter (INDEPENDENT_AMBULATORY_CARE_PROVIDER_SITE_OTHER): Payer: Self-pay | Admitting: Family Medicine

## 2020-03-22 DIAGNOSIS — F3289 Other specified depressive episodes: Secondary | ICD-10-CM

## 2020-03-26 ENCOUNTER — Ambulatory Visit (INDEPENDENT_AMBULATORY_CARE_PROVIDER_SITE_OTHER): Payer: BC Managed Care – PPO | Admitting: Professional

## 2020-03-26 DIAGNOSIS — F4323 Adjustment disorder with mixed anxiety and depressed mood: Secondary | ICD-10-CM | POA: Diagnosis not present

## 2020-03-26 MED ORDER — BUPROPION HCL ER (SR) 150 MG PO TB12: 150.0000 mg | ORAL_TABLET | Freq: Every day | ORAL | 0 refills | Status: DC

## 2020-03-26 NOTE — Telephone Encounter (Signed)
Refill request last filled in December.

## 2020-03-26 NOTE — Telephone Encounter (Signed)
Last OV with Dawn 

## 2020-03-29 ENCOUNTER — Ambulatory Visit (INDEPENDENT_AMBULATORY_CARE_PROVIDER_SITE_OTHER): Payer: BC Managed Care – PPO | Admitting: Family Medicine

## 2020-04-02 ENCOUNTER — Ambulatory Visit: Payer: BC Managed Care – PPO | Admitting: Professional

## 2020-04-03 ENCOUNTER — Ambulatory Visit (INDEPENDENT_AMBULATORY_CARE_PROVIDER_SITE_OTHER): Payer: BC Managed Care – PPO | Admitting: Family Medicine

## 2020-04-03 ENCOUNTER — Other Ambulatory Visit: Payer: Self-pay

## 2020-04-03 ENCOUNTER — Encounter (INDEPENDENT_AMBULATORY_CARE_PROVIDER_SITE_OTHER): Payer: Self-pay | Admitting: Family Medicine

## 2020-04-03 VITALS — BP 116/79 | HR 76 | Temp 98.0°F | Ht 66.0 in | Wt 226.0 lb

## 2020-04-03 DIAGNOSIS — E559 Vitamin D deficiency, unspecified: Secondary | ICD-10-CM

## 2020-04-03 DIAGNOSIS — E1169 Type 2 diabetes mellitus with other specified complication: Secondary | ICD-10-CM

## 2020-04-03 DIAGNOSIS — Z9189 Other specified personal risk factors, not elsewhere classified: Secondary | ICD-10-CM | POA: Diagnosis not present

## 2020-04-03 DIAGNOSIS — Z6841 Body Mass Index (BMI) 40.0 and over, adult: Secondary | ICD-10-CM

## 2020-04-03 MED ORDER — OZEMPIC (0.25 OR 0.5 MG/DOSE) 2 MG/1.5ML ~~LOC~~ SOPN: 0.5000 mg | PEN_INJECTOR | SUBCUTANEOUS | 0 refills | Status: DC

## 2020-04-03 MED ORDER — VITAMIN D (ERGOCALCIFEROL) 1.25 MG (50000 UNIT) PO CAPS: 50000.0000 [IU] | ORAL_CAPSULE | ORAL | 0 refills | Status: DC

## 2020-04-05 NOTE — Progress Notes (Signed)
Chief Complaint:   OBESITY Robin Glover is here to discuss her progress with her obesity treatment plan along with follow-up of her obesity related diagnoses. Robin Glover is on following a lower carbohydrate, vegetable and lean protein rich diet plan and states she is following her eating plan approximately 0% of the time. Robin Glover states she is not exercising regularly.  Today's visit was #: 18 Starting weight: 250 lbs Starting date: 02/09/2019 Today's weight: 226 lbs Today's date: 04/03/2020 Total lbs lost to date: 24 lbs Total lbs lost since last in-office visit: +3  Interim History: Robin Glover is quite overwhelmed due to work and family issues.  She has been unable to stick to the low carb plan due to stress eating.  Subjective:   1. Vitamin D deficiency Vitamin D is low at 39.6.  On prescription vitamin D every 3 days.  2. Type 2 diabetes mellitus with other specified complication, without long-term current use of insulin (HCC) Well-controlled.  Endorses hunger and constipation.  Does not want to increase Ozempic dose due to constipation.   Lab Results  Component Value Date   HGBA1C 5.7 (H) 10/31/2019   HGBA1C 5.7 (H) 06/09/2019   HGBA1C 5.8 (H) 02/09/2019   Lab Results  Component Value Date   LDLCALC 87 06/09/2019   CREATININE 0.75 06/09/2019   Lab Results  Component Value Date   INSULIN 10.6 10/31/2019   INSULIN 13.7 06/09/2019   INSULIN 12.5 02/09/2019   3. At risk for constipation Robin Glover is at increased risk for constipation due to taking Ozempic.    Assessment/Plan:   1. Vitamin D deficiency Refill vitamin D 50,000 IU every 3 days.  Will check vitamin D level at next office visit.  - Refill Vitamin D, Ergocalciferol, (DRISDOL) 1.25 MG (50000 UNIT) CAPS capsule; Take 1 capsule (50,000 Units total) by mouth every 3 (three) days.  Dispense: 10 capsule; Refill: 0  2. Type 2 diabetes mellitus with other specified complication, without long-term current use of insulin  (HCC) Refill Ozempic 0.5 mg subcutaneously weekly.  May use MiraLAX up to 2 times per day.  - Refill Semaglutide,0.25 or 0.5MG /DOS, (OZEMPIC, 0.25 OR 0.5 MG/DOSE,) 2 MG/1.5ML SOPN; Inject 0.5 mg into the skin once a week.  Dispense: 6 mL; Refill: 0  3. At risk for constipation Robin Glover was given approximately 15 minutes of counseling today regarding prevention of constipation. She was encouraged to increase water and fiber intake.   4. Class 2 obesity: BMI 36  Robin Glover is currently in the action stage of change. As such, her goal is to continue with weight loss efforts. She has agreed to the Category 2 Plan and keeping a food journal and adhering to recommended goals of 400-500 calories and 35 grams of protein at supper.   Exercise goals: All adults should avoid inactivity. Some physical activity is better than none, and adults who participate in any amount of physical activity gain some health benefits.  Behavioral modification strategies: increasing lean protein intake, decreasing simple carbohydrates and meal planning and cooking strategies.  Handout provided:  Category 2 Recipes.  Robin Glover has agreed to follow-up with our clinic in 3 weeks, fasting.   Objective:   Blood pressure 116/79, pulse 76, temperature 98 F (36.7 C), height 5\' 6"  (1.676 m), weight 226 lb (102.5 kg), SpO2 100 %. Body mass index is 36.48 kg/m.  General: Cooperative, alert, well developed, in no acute distress. HEENT: Conjunctivae and lids unremarkable. Cardiovascular: Regular rhythm.  Lungs: Normal work of breathing. Neurologic:  No focal deficits.   Lab Results  Component Value Date   CREATININE 0.75 06/09/2019   BUN 14 06/09/2019   NA 137 06/09/2019   K 4.4 06/09/2019   CL 102 06/09/2019   CO2 22 06/09/2019   Lab Results  Component Value Date   ALT 14 06/09/2019   AST 16 06/09/2019   ALKPHOS 66 06/09/2019   BILITOT 0.4 06/09/2019   Lab Results  Component Value Date   HGBA1C 5.7 (H) 10/31/2019    HGBA1C 5.7 (H) 06/09/2019   HGBA1C 5.8 (H) 02/09/2019   HGBA1C 6.0 (H) 01/15/2015   Lab Results  Component Value Date   INSULIN 10.6 10/31/2019   INSULIN 13.7 06/09/2019   INSULIN 12.5 02/09/2019   Lab Results  Component Value Date   TSH 3.660 06/09/2019   Lab Results  Component Value Date   CHOL 145 06/09/2019   HDL 48 06/09/2019   LDLCALC 87 06/09/2019   TRIG 47 06/09/2019   CHOLHDL 4.4 01/15/2015   Lab Results  Component Value Date   WBC 6.5 06/09/2019   HGB 13.0 06/09/2019   HCT 39.2 06/09/2019   MCV 92 06/09/2019   PLT 287 06/09/2019   Lab Results  Component Value Date   IRON 88 06/09/2019   TIBC 351 06/09/2019   FERRITIN 73 06/09/2019   Attestation Statements:   Reviewed by clinician on day of visit: allergies, medications, problem list, medical history, surgical history, family history, social history, and previous encounter notes.  I, Insurance claims handler, CMA, am acting as Energy manager for [employer], FNP.  I have reviewed the above documentation for accuracy and completeness, and I agree with the above. -  Jesse Sans, FNP

## 2020-04-06 ENCOUNTER — Encounter (INDEPENDENT_AMBULATORY_CARE_PROVIDER_SITE_OTHER): Payer: Self-pay | Admitting: Family Medicine

## 2020-04-09 ENCOUNTER — Ambulatory Visit: Payer: BC Managed Care – PPO | Admitting: Professional

## 2020-04-11 ENCOUNTER — Ambulatory Visit: Payer: BC Managed Care – PPO | Admitting: Professional

## 2020-04-17 ENCOUNTER — Other Ambulatory Visit (INDEPENDENT_AMBULATORY_CARE_PROVIDER_SITE_OTHER): Payer: Self-pay | Admitting: Family Medicine

## 2020-04-17 DIAGNOSIS — E559 Vitamin D deficiency, unspecified: Secondary | ICD-10-CM

## 2020-04-17 NOTE — Telephone Encounter (Signed)
90 day supply request

## 2020-04-17 NOTE — Telephone Encounter (Signed)
Pt last seen by Dawn Whitmire, FNP.  

## 2020-04-18 ENCOUNTER — Other Ambulatory Visit (INDEPENDENT_AMBULATORY_CARE_PROVIDER_SITE_OTHER): Payer: Self-pay | Admitting: Family Medicine

## 2020-04-18 DIAGNOSIS — F3289 Other specified depressive episodes: Secondary | ICD-10-CM

## 2020-04-18 NOTE — Telephone Encounter (Signed)
Pt last seen by Dawn Whitmire, FNP.  

## 2020-04-23 ENCOUNTER — Ambulatory Visit: Payer: BC Managed Care – PPO | Admitting: Professional

## 2020-04-24 ENCOUNTER — Encounter (INDEPENDENT_AMBULATORY_CARE_PROVIDER_SITE_OTHER): Payer: Self-pay

## 2020-04-24 ENCOUNTER — Telehealth (INDEPENDENT_AMBULATORY_CARE_PROVIDER_SITE_OTHER): Payer: Self-pay

## 2020-04-24 NOTE — Telephone Encounter (Signed)
PA has been submitted through cover my meds for Ozempic Waiting for approval or denial  Your information has been submitted to [employer]. [employer] is reviewing the PA request and you will receive an electronic response. You may check for the updated outcome later by reopening this request. The standard fax determination will also be sent to you directly.  If you have any questions about your PA submission, contact [employer] at [phone].

## 2020-04-24 NOTE — Telephone Encounter (Signed)
There is currently an authorization in place for this product, effective 11/23/2019 through 11/22/2020.

## 2020-04-25 ENCOUNTER — Ambulatory Visit (INDEPENDENT_AMBULATORY_CARE_PROVIDER_SITE_OTHER): Payer: BC Managed Care – PPO | Admitting: Family Medicine

## 2020-04-25 ENCOUNTER — Encounter (INDEPENDENT_AMBULATORY_CARE_PROVIDER_SITE_OTHER): Payer: Self-pay | Admitting: Family Medicine

## 2020-04-25 ENCOUNTER — Ambulatory Visit (INDEPENDENT_AMBULATORY_CARE_PROVIDER_SITE_OTHER): Payer: BC Managed Care – PPO | Admitting: Professional

## 2020-04-25 ENCOUNTER — Other Ambulatory Visit: Payer: Self-pay

## 2020-04-25 VITALS — BP 119/86 | HR 87 | Temp 98.2°F | Ht 66.0 in | Wt 227.0 lb

## 2020-04-25 DIAGNOSIS — E1169 Type 2 diabetes mellitus with other specified complication: Secondary | ICD-10-CM

## 2020-04-25 DIAGNOSIS — F4323 Adjustment disorder with mixed anxiety and depressed mood: Secondary | ICD-10-CM

## 2020-04-25 DIAGNOSIS — F3289 Other specified depressive episodes: Secondary | ICD-10-CM

## 2020-04-25 DIAGNOSIS — E785 Hyperlipidemia, unspecified: Secondary | ICD-10-CM | POA: Diagnosis not present

## 2020-04-25 DIAGNOSIS — E66813 Obesity, class 3: Secondary | ICD-10-CM

## 2020-04-25 DIAGNOSIS — Z6841 Body Mass Index (BMI) 40.0 and over, adult: Secondary | ICD-10-CM

## 2020-04-25 DIAGNOSIS — E559 Vitamin D deficiency, unspecified: Secondary | ICD-10-CM

## 2020-04-25 DIAGNOSIS — Z9189 Other specified personal risk factors, not elsewhere classified: Secondary | ICD-10-CM

## 2020-04-25 MED ORDER — OZEMPIC (0.25 OR 0.5 MG/DOSE) 2 MG/1.5ML ~~LOC~~ SOPN: 0.5000 mg | PEN_INJECTOR | SUBCUTANEOUS | 0 refills | Status: DC

## 2020-04-25 MED ORDER — VITAMIN D (ERGOCALCIFEROL) 1.25 MG (50000 UNIT) PO CAPS: 50000.0000 [IU] | ORAL_CAPSULE | ORAL | 1 refills | Status: DC

## 2020-04-25 MED ORDER — BUPROPION HCL ER (SR) 150 MG PO TB12: 150.0000 mg | ORAL_TABLET | Freq: Two times a day (BID) | ORAL | 0 refills | Status: DC

## 2020-04-25 NOTE — Progress Notes (Signed)
Office: [phone]  /  Fax: [phone]    Date: May 01, 2020   Appointment Start Time: 3:01pm Duration: 39 minutes Provider: Lawerance Cruel, Psy.D. Type of Session: Intake for Individual Therapy  Location of Patient: Work Location of Provider: Provider's Home (private office) Type of Contact: Telepsychological Visit via MyChart Video Visit  Informed Consent: Prior to proceeding with today's appointment, two pieces of identifying information were obtained. In addition, Robin Glover's physical location at the time of this appointment was obtained as well a phone number she could be reached at in the event of technical difficulties. Robin Glover and this provider participated in today's telepsychological service.   The provider's role was explained to Robin Glover. The provider reviewed and discussed issues of confidentiality, privacy, and limits therein (e.g., reporting obligations). In addition to verbal informed consent, written informed consent for psychological services was obtained prior to the initial appointment. Since the clinic is not a 24/7 crisis center, mental health emergency resources were shared and this  provider explained MyChart, e-mail, voicemail, and/or other messaging systems should be utilized only for non-emergency reasons. This provider also explained that information obtained during appointments will be placed in Robin Glover's medical record and relevant information will be shared with other providers at [facility] for coordination of care. Robin Glover agreed information may be shared with other [facility] providers as needed for coordination of care and by signing the service agreement document, she provided written consent for coordination of care. Prior to initiating telepsychological services, Robin Glover completed an informed consent document, which included the development of a safety plan (i.e., an emergency contact and emergency resources) in the event  of an emergency/crisis. Robin Glover expressed understanding of the rationale of the safety plan. Robin Glover verbally acknowledged understanding she is ultimately responsible for understanding her insurance benefits for telepsychological and in-person services. This provider also reviewed confidentiality, as it relates to telepsychological services, as well as the rationale for telepsychological services (i.e., to reduce exposure risk to COVID-19). Robin Glover  acknowledged understanding that appointments cannot be recorded without both party consent and she is aware she is responsible for securing confidentiality on her end of the session. Robin Glover verbally consented to proceed.  Chief Complaint/HPI: Robin Glover was referred by [facility], Robin Glover due to other depression, with emotional eating. Per the note for the visit with Adah Salvage, Robin Glover on April 25, 2020, "Robin Glover feels she lacks motivation and is unable to get on plan and she needs to be. She feels she has almost given up. She has the food she is supposed to eat but does not eat it."   During today's appointment, Robin Glover was verbally administered a questionnaire assessing various behaviors related to emotional eating. Robin Glover endorsed the following: experience food cravings on a regular basis, eat certain foods when you are anxious, stressed, depressed, or your feelings are hurt, use food to help you cope with emotional situations, find food is comforting to you, overeat when you are alone, but eat much less when you are with other people and eat as a reward. She shared she craves sweets (e.g., chocolate, ice cream, cake). Robin Glover stated she started noticing engagement in emotional eating "in the past year" and described the current frequency of emotional eating as a couple times a month. In addition, Robin Glover denied a history of binge eating. Robin Glover denied a history of restricting food intake, purging and engagement in other compensatory strategies for weight loss, and has  never been diagnosed with an eating disorder. She also  denied a history of treatment for emotional eating. Currently, Robin Glover indicated stress at work and stress regarding care giving for her father triggers emotional eating, whereas feeling in control makes emotional eating better. Furthermore, Robin Glover denied other problems of concern.    Mental Status Examination:  Appearance: well groomed and appropriate hygiene  Behavior: appropriate to circumstances Mood: sad Affect: mood congruent Speech: normal in rate, volume, and tone Eye Contact: appropriate Psychomotor Activity: unable to assess  Gait: unable to assess Thought Process: linear, logical, and goal directed  Thought Content/Perception: denies suicidal and homicidal ideation, plan, and intent and no hallucinations, delusions, bizarre thinking or behavior reported or observed Orientation: time, person, place, and purpose of appointment Memory/Concentration: memory, attention, language, and fund of knowledge intact  Insight/Judgment: fair  Family & Psychosocial History: Robin Glover reported she is not in a relationship. She shared she has a daughter (age 42). 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 Additionally, Robin Glover shared her highest level of education obtained is a bachelor's degree. Currently, Robin Glover's social support system consists of her church and mother. Moreover, Robin Glover stated she resides with her daughter.   Medical History:  Past Medical History:  Diagnosis Date  . Anxiety   . Chronic back pain   . Diabetes (HCC)   . Food allergy   . Gallbladder problem   . GERD (gastroesophageal reflux disease)   . High cholesterol   . Hormone disorder   . Hypertension   . IBS (irritable bowel syndrome)   . Lactose intolerance   . Pre-diabetes   . Sleep apnea    Past Surgical History:  Procedure Laterality Date  . CESAREAN SECTION    . CHOLECYSTECTOMY     Current Outpatient Medications on File  Prior to Visit  Medication Sig Dispense Refill  . atorvastatin (LIPITOR) 20 MG tablet Take 20 mg by mouth daily.    Marland Kitchen buPROPion (WELLBUTRIN SR) 150 MG 12 hr tablet Take 1 tablet (150 mg total) by mouth 2 (two) times daily. 180 tablet 0  . diphenhydrAMINE (BENADRYL) 25 MG tablet Take 25 mg by mouth every 6 (six) hours as needed.    Marland Kitchen lisinopril (ZESTRIL) 10 MG tablet Take 10 mg by mouth daily.    . Multiple Vitamin (MULTIVITAMIN) tablet Take 1 tablet by mouth daily.    Marland Kitchen omeprazole (PRILOSEC) 20 MG capsule Take 20 mg by mouth daily.    . Semaglutide,0.25 or 0.5MG /DOS, (OZEMPIC, 0.25 OR 0.5 MG/DOSE,) 2 MG/1.5ML SOPN Inject 0.5 mg into the skin once a week. 6 mL 0  . Vitamin D, Ergocalciferol, (DRISDOL) 1.25 MG (50000 UNIT) CAPS capsule Take 1 capsule (50,000 Units total) by mouth every 3 (three) days. 30 capsule 1   No current facility-administered medications on file prior to visit.   Mental Health History: Robin Glover reported she currently meets with Robin Glover with [facility]. She stated she initiated therapeutic services around February of 2022 with Robin Glover to address day to day stressors, being a caregiver for her father, and self-confidence. Robin Glover stated they typically do not discuss eating related concerns. They meet bi-weekly. Robin Glover agreed to sign an authorization for coordination of care if deemed necessary. Currently, Robin Glover shared [place], Robin Glover prescribes Wellbutrin. She stated it was initially helpful, but now she reportedly feels it "does not work as well as it did." She indicated the dose was increased, but she has not started it. Robin Glover was encouraged to take it as prescribed; she agreed. Robin Glover  reported there is no history of hospitalizations for psychiatric concerns. Robin Glover denied a family history of mental health related concerns. She indicated her brother "struggled with addiction to alcohol and narcotics." Haydyn reported there is no history of trauma  including psychological, physical  and sexual abuse, as well as neglect.   Skylin described her typical mood lately as "disappointed" in self, noting "some days [she] can turn negatives into positives." She explained the disappointment is secondary to "let[ting] [her] guard down" with someone as it relates to a romantic relationship. Aside from concerns noted above and endorsed on the PHQ-9 and GAD-7, Callee reported experiencing decreased motivation at home and worry thoughts about her father's well-being and finances. Cordell endorsed "occasionally" consuming alcohol, noting it is typically every couple months in the form of one standard drink. She denied tobacco use. She denied illicit/recreational substance use. Regarding caffeine intake, Sahasra reported consuming a cup of coffee (10oz) 3-4xs a week. Furthermore, Robin Glover indicated she is not experiencing the following: hallucinations and delusions, paranoia, symptoms of mania , social withdrawal, crying spells and panic attacks. She also denied history of and current suicidal ideation, plan, and intent; history of and current homicidal ideation, plan, and intent; and history of and current engagement in self-harm.  The following strengths were reported by [facility]: giver, dependable, and detail oriented. The following strengths were observed by this provider: ability to express thoughts and feelings during the therapeutic session, ability to establish and benefit from therapeutic relationship, willingness to work toward established goal(s) with the clinic and ability to engage in reciprocal conversation.   Legal History: Zalika reported there is no history of legal involvement.   Structured Assessments Results: The Patient Health Questionnaire-9 (PHQ-9) is a self-report measure that assesses symptoms and severity of depression over the course of the last two weeks. Doniesha obtained a score of 7 suggesting mild depression. Zionah finds the endorsed symptoms to  be not difficult at all. [0= Not at all; 1= Several days; 2= More than half the days; 3= Nearly every day] Little interest or pleasure in doing things 0  Feeling down, depressed, or hopeless 1  Trouble falling or staying asleep, or sleeping too much- attributed it to working third shift and rotating shifts 3  Feeling tired or having little energy 1  Poor appetite or overeating- fluctuation 1  Feeling bad about yourself --- or that you are a failure or have let yourself or your family down 1  Trouble concentrating on things, such as reading the newspaper or watching television 0  Moving or speaking so slowly that other people could have noticed? Or the opposite --- being so fidgety or restless that you have been moving around a lot more than usual 0  Thoughts that you would be better off dead or hurting yourself in some way 0  PHQ-9 Score 7    The Generalized Anxiety Disorder-7 (GAD-7) is a brief self-report measure that assesses symptoms of anxiety over the course of the last two weeks. Janelie obtained a score of 2 suggesting minimal anxiety. Zacaria finds the endorsed symptoms to be not difficult at all. [0= Not at all; 1= Several days; 2= Over half the days; 3= Nearly every day] Feeling nervous, anxious, on edge 0  Not being able to stop or control worrying 0  Worrying too much about different things 1  Trouble relaxing 0  Being so restless that it's hard to sit still 0  Becoming easily annoyed or irritable 1  Feeling afraid as  if something awful might happen 0  GAD-7 Score 2   Interventions:  Conducted a chart review Focused on rapport building Verbally administered PHQ-9 and GAD-7 for symptom monitoring Verbally administered Food & Mood questionnaire to assess various behaviors related to emotional eating Provided emphatic reflections and validation Collaborated with patient on a treatment goal  Psychoeducation provided regarding physical versus emotional hunger  Provisional DSM-5  Diagnosis(es): 311 (F32.8) Other Specified Depressive Disorder, Emotional Eating Behaviors  Plan: Robin ArchJamina appears able and willing to participate as evidenced by collaboration on a treatment goal, engagement in reciprocal conversation, and asking questions as needed for clarification. Chene expressed a plan to speak with Robin Glover about whether or not they would be able to focus on her eating habits during their next appointment, which is on May 09, 2020. Nonetheless, she was receptive to scheduling an appointment in three weeks with this provider, which will be via MyChart Video Visit. She noted a plan to cancel her appointment with this provider if she is able to address eating concerns with Robin Glover. This provider reminded Robin ArchJamina regarding the 48-hr cancellation policy to avoid a no show fee charge; she acknowledged understanding. The following treatment goal was established: increase coping skills. This provider will regularly review the treatment plan and medical chart to keep informed of status changes. Kingslee expressed understanding and agreement with the initial treatment plan of care.   Robin ArchJamina will be sent a handout via e-mail to utilize between now and the next appointment to increase awareness of hunger patterns and subsequent eating. Reata provided verbal consent during today's appointment for this provider to send the handout via e-mail.

## 2020-04-26 LAB — COMPREHENSIVE METABOLIC PANEL
ALT: 23 IU/L (ref 0–32)
AST: 19 IU/L (ref 0–40)
Albumin/Globulin Ratio: 1.6 (ref 1.2–2.2)
Albumin: 4.1 g/dL (ref 3.8–4.8)
Alkaline Phosphatase: 62 IU/L (ref 44–121)
BUN/Creatinine Ratio: 9 (ref 9–23)
BUN: 7 mg/dL (ref 6–24)
Bilirubin Total: 0.3 mg/dL (ref 0.0–1.2)
CO2: 22 mmol/L (ref 20–29)
Calcium: 8.9 mg/dL (ref 8.7–10.2)
Chloride: 103 mmol/L (ref 96–106)
Creatinine, Ser: 0.8 mg/dL (ref 0.57–1.00)
Globulin, Total: 2.5 g/dL (ref 1.5–4.5)
Glucose: 101 mg/dL — ABNORMAL HIGH (ref 65–99)
Potassium: 4.5 mmol/L (ref 3.5–5.2)
Sodium: 139 mmol/L (ref 134–144)
Total Protein: 6.6 g/dL (ref 6.0–8.5)
eGFR: 94 mL/min/{1.73_m2} (ref >59)

## 2020-04-26 LAB — LIPID PANEL WITH LDL/HDL RATIO
Cholesterol, Total: 166 mg/dL (ref 100–199)
HDL: 50 mg/dL (ref >39)
LDL Chol Calc (NIH): 105 mg/dL — ABNORMAL HIGH (ref 0–99)
LDL/HDL Ratio: 2.1 ratio (ref 0.0–3.2)
Triglycerides: 54 mg/dL (ref 0–149)
VLDL Cholesterol Cal: 11 mg/dL (ref 5–40)

## 2020-04-26 LAB — HEMOGLOBIN A1C
Est. average glucose Bld gHb Est-mCnc: 108 mg/dL
Hgb A1c MFr Bld: 5.4 % (ref 4.8–5.6)

## 2020-04-26 LAB — VITAMIN D 25 HYDROXY (VIT D DEFICIENCY, FRACTURES): Vit D, 25-Hydroxy: 52.2 ng/mL (ref 30.0–100.0)

## 2020-04-26 NOTE — Progress Notes (Signed)
Chief Complaint:   OBESITY Robin Glover is here to discuss her progress with her obesity treatment plan along with follow-up of her obesity related diagnoses. Robin Glover is on the Category 2 Plan and keeping a food journal and adhering to recommended goals of 400-500 calories and 35 g protein and states she is following her eating plan approximately 5% of the time. Robin Glover states she is not currently exercising.  Today's visit was #: 19 Starting weight: 250 lbs Starting date: 02/09/2019 Today's weight: 227 lbs Today's date: 04/25/2020 Total lbs lost to date: 23 Total lbs lost since last in-office visit: 0   Interim History: Robin Glover is struggling with adhering to plan due to excessive stress related to work and dealing with her elderly father. . She is unable to find motivation for sticking to plan and notes excessive stress eating.  Subjective:   1. Vitamin D deficiency Robin Glover's Vitamin D level was 39.6 on 10/31/2019. She is currently taking prescription vitamin D 50,000 IU each week. She denies nausea, vomiting or muscle weakness.   Ref. Range 10/31/2019 09:21  Vitamin D, 25-Hydroxy Latest Ref Range: 30.0 - 100.0 ng/mL 39.6   2. Type 2 diabetes mellitus with other specified complication, without long-term current use of insulin (HCC) Robin Glover's diabetes is well controlled. Her last A1c 5.7. She is on Ozempic and reports constipation has improved.  3. Hyperlipidemia associated with type 2 diabetes mellitus (HCC) Robin Glover's HDL is low at 48. Her triglycerides are WNL and LDL is at goal (87). She is on Lipitor 20 mg.  4. Other depression with emotional eating  Robin Glover feels she lacks motivation and is unable to get on plan and she needs to be. She feels she has almost given up. She has the food she is supposed to eat but does not eat it.   5. At risk for impaired metabolic function Robin Glover is at increased risk for impaired metabolic function due to diabetes, obesity, inadequate protein, and no  exercise.  Assessment/Plan:   1. Vitamin D deficiency  She agrees to continue to take prescription Vitamin D @50 ,000 IU every week and will follow-up for routine testing of Vitamin D, at least 2-3 times per year to avoid over-replacement. Check labs today.  - VITAMIN D 25 Hydroxy (Vit-D Deficiency, Fractures)  - Vitamin D, Ergocalciferol, (DRISDOL) 1.25 MG (50000 UNIT) CAPS capsule; Take 1 capsule (50,000 Units total) by mouth every 3 (three) days.  Dispense: 30 capsule; Refill: 1  2. Type 2 diabetes mellitus with other specified complication, without long-term current use of insulin (HCC)  Check labs today. Continue Ozempic.  - Hemoglobin A1c - Comprehensive metabolic panel  - Semaglutide,0.25 or 0.5MG /DOS, (OZEMPIC, 0.25 OR 0.5 MG/DOSE,) 2 MG/1.5ML SOPN; Inject 0.5 mg into the skin once a week.  Dispense: 6 mL; Refill: 0  3. Hyperlipidemia associated with type 2 diabetes mellitus (HCC)  Check labs today. Continue Lipitor. - Lipid Panel With LDL/HDL Ratio  4. Other depression with emotional eating  . Refer to Dr. .  - buPROPion [facility] SR) 150 MG 12 hr tablet; Take 1 tablet (150 mg total) by mouth 2 (two) times daily.  Dispense: 180 tablet; Refill: 0 - Ambulatory referral to Psychology  5. At risk for impaired metabolic function Robin Glover was given approximately 15 minutes of impaired  metabolic function prevention counseling today. We discussed intensive lifestyle modifications today with an emphasis on specific nutrition and exercise instructions and strategies.   Repetitive spaced learning was employed today to elicit superior memory  formation and behavioral change.  6. Obesity: Current BMI 36 Robin Glover is currently in the action stage of change. As such, her goal is to continue with weight loss efforts. She has agreed to the Category 2 Plan, keeping a food journal and adhering to recommended goals of 400-500 calories and 35 g protein with supper or following a lower  carbohydrate, vegetable and lean protein rich diet plan.   Exercise goals: No exercise has been prescribed at this time.  Behavioral modification strategies: decreasing simple carbohydrates.  Robin Glover has agreed to follow-up with our clinic in 3-4 weeks.  Robin Glover was informed we would discuss her lab results at her next visit unless there is a critical issue that needs to be addressed sooner. Robin Glover agreed to keep her next visit at the agreed upon time to discuss these results.  Objective:   Blood pressure 119/86, pulse 87, temperature 98.2 F (36.8 C), height 5\' 6"  (1.676 m), weight 227 lb (103 kg), SpO2 99 %. Body mass index is 36.64 kg/m.  General: Cooperative, alert, well developed, in no acute distress. HEENT: Conjunctivae and lids unremarkable. Cardiovascular: Regular rhythm.  Lungs: Normal work of breathing. Neurologic: No focal deficits.   Lab Results  Component Value Date   CREATININE 0.80 04/25/2020   BUN 7 04/25/2020   NA 139 04/25/2020   K 4.5 04/25/2020   CL 103 04/25/2020   CO2 22 04/25/2020   Lab Results  Component Value Date   ALT 23 04/25/2020   AST 19 04/25/2020   ALKPHOS 62 04/25/2020   BILITOT 0.3 04/25/2020   Lab Results  Component Value Date   HGBA1C 5.4 04/25/2020   HGBA1C 5.7 (H) 10/31/2019   HGBA1C 5.7 (H) 06/09/2019   HGBA1C 5.8 (H) 02/09/2019   HGBA1C 6.0 (H) 01/15/2015   Lab Results  Component Value Date   INSULIN 10.6 10/31/2019   INSULIN 13.7 06/09/2019   INSULIN 12.5 02/09/2019   Lab Results  Component Value Date   TSH 3.660 06/09/2019   Lab Results  Component Value Date   CHOL 166 04/25/2020   HDL 50 04/25/2020   LDLCALC 105 (H) 04/25/2020   TRIG 54 04/25/2020   CHOLHDL 4.4 01/15/2015   Lab Results  Component Value Date   WBC 6.5 06/09/2019   HGB 13.0 06/09/2019   HCT 39.2 06/09/2019   MCV 92 06/09/2019   PLT 287 06/09/2019   Lab Results  Component Value Date   IRON 88 06/09/2019   TIBC 351 06/09/2019   FERRITIN  73 06/09/2019    Attestation Statements:   Reviewed by clinician on day of visit: allergies, medications, problem list, medical history, surgical history, family history, social history, and previous encounter notes.  06/11/2019, am acting as Edmund Hilda for Energy manager, FNP.  I have reviewed the above documentation for accuracy and completeness, and I agree with the above. -  [place], FNP

## 2020-04-30 ENCOUNTER — Encounter (INDEPENDENT_AMBULATORY_CARE_PROVIDER_SITE_OTHER): Payer: Self-pay | Admitting: Family Medicine

## 2020-05-01 ENCOUNTER — Telehealth (INDEPENDENT_AMBULATORY_CARE_PROVIDER_SITE_OTHER): Payer: BC Managed Care – PPO | Admitting: Psychology

## 2020-05-01 DIAGNOSIS — F3289 Other specified depressive episodes: Secondary | ICD-10-CM

## 2020-05-02 ENCOUNTER — Encounter: Payer: Self-pay | Admitting: Nurse Practitioner

## 2020-05-02 ENCOUNTER — Ambulatory Visit (INDEPENDENT_AMBULATORY_CARE_PROVIDER_SITE_OTHER): Payer: BC Managed Care – PPO | Admitting: Nurse Practitioner

## 2020-05-02 ENCOUNTER — Other Ambulatory Visit: Payer: Self-pay

## 2020-05-02 VITALS — BP 124/82 | Ht 66.0 in | Wt 229.0 lb

## 2020-05-02 DIAGNOSIS — Z113 Encounter for screening for infections with a predominantly sexual mode of transmission: Secondary | ICD-10-CM

## 2020-05-02 DIAGNOSIS — A539 Syphilis, unspecified: Secondary | ICD-10-CM | POA: Diagnosis not present

## 2020-05-02 DIAGNOSIS — Z30011 Encounter for initial prescription of contraceptive pills: Secondary | ICD-10-CM

## 2020-05-02 DIAGNOSIS — Z01419 Encounter for gynecological examination (general) (routine) without abnormal findings: Secondary | ICD-10-CM | POA: Diagnosis not present

## 2020-05-02 MED ORDER — NORETHINDRONE 0.35 MG PO TABS: 1.0000 | ORAL_TABLET | Freq: Every day | ORAL | 3 refills | Status: DC

## 2020-05-02 NOTE — Progress Notes (Signed)
   Robin Glover Apr 15, 1975 [id number]   History:  45 y.o. G1P1001 presents for annual exam without GYN complaints. Monthly cycle. 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 Normal mammogram history. Mother with history of breast cancer at age 7. Sexually active occasionally with one partner, no contraception. Would like STD screen today. DM, HTN managed by PCP.   Gynecologic History Patient's last menstrual period was 04/27/2020. Period Cycle (Days): 28 Period Duration (Days): 4 Period Pattern: Regular Menstrual Flow: Moderate,Light Dysmenorrhea: None Contraception/Family planning: none  Health Maintenance Last Pap: 04/20/2018. Results were: normal Last mammogram: 09/2019. Results were: normal Last colonoscopy: N/A Last Dexa: N/A  Past medical history, past surgical history, family history and social history were all reviewed and documented in the EPIC chart. 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   ROS:  A ROS was performed and pertinent positives and negatives are included.  Exam:  Vitals:   05/02/20 1536  BP: 124/82  Weight: 229 lb (103.9 kg)  Height: 5\' 6"  (1.676 m)   Body mass index is 36.96 kg/m.  General appearance:  Normal Thyroid:  Symmetrical, normal in size, without palpable masses or nodularity. Respiratory  Auscultation:  Clear without wheezing or rhonchi Cardiovascular  Auscultation:  Regular rate, without rubs, murmurs or gallops  Edema/varicosities:  Not grossly evident Abdominal  Soft,nontender, without masses, guarding or rebound.  Liver/spleen:  No organomegaly noted  Hernia:  None appreciated  Skin  Inspection:  Grossly normal Breasts: Examined lying and sitting.   Right: Without masses, retractions, nipple discharge or axillary adenopathy.   Left: Without masses, retractions, nipple discharge or axillary adenopathy. Gentitourinary   Inguinal/mons:  Normal without inguinal adenopathy  External genitalia:  Normal  appearing vulva with no masses, tenderness, or lesions  BUS/Urethra/Skene's glands:  Normal  Vagina:  Normal appearing with normal color and discharge, no lesions  Cervix:  Normal appearing without discharge or lesions  Uterus:  Normal in size, shape and contour.  Midline and mobile, nontender  Adnexa/parametria:     Rt: Normal in size, without masses or tenderness.   Lt: Normal in size, without masses or tenderness.  Anus and perineum: Normal  Digital rectal exam: Normal sphincter tone without palpated masses or tenderness  Assessment/Plan:  45 y.o. G1P1001 for annual exam.   Well female exam with routine gynecological exam - Plan: SURESWAB CT/NG/T. vaginalis, HIV Antibody (routine testing w rflx), RPR. Education provided on SBEs, importance of preventative screenings, current guidelines, high calcium diet, regular exercise, and multivitamin daily. Labs with PCP.  Screen for STD (sexually transmitted disease) - Plan: SURESWAB CT/NG/T. vaginalis, HIV Antibody (routine testing w rflx), RPR  Encounter for initial prescription of contraceptive pills - Plan: norethindrone (ORTHO MICRONOR) 0.35 MG tablet daily. She is sexually active occasionally and is interested in starting birth control. We will avoid estrogen-containing products due to HTN. She is not interested in Nexplanon or IUD (migrated in the past). She has taken Micronor in the past and did well.   Screening for cervical cancer - 2015 LGSIL with negative biopsy. Will repeat at 3-year interval per guidelines.  Screening for breast cancer - Normal mammogram history.  Continue annual screenings.  Normal breast exam today. Mother diagnosed with breast cancer at age 65.   Return in 1 year for annual.     40 DNP, 3:48 PM 05/02/2020

## 2020-05-02 NOTE — Patient Instructions (Signed)
Health Maintenance, Female Adopting a healthy lifestyle and getting preventive care are important in promoting health and wellness. Ask your health care provider about:  The right schedule for you to have regular tests and exams.  Things you can do on your own to prevent diseases and keep yourself healthy. What should I know about diet, weight, and exercise? Eat a healthy diet  Eat a diet that includes plenty of vegetables, fruits, low-fat dairy products, and lean protein.  Do not eat a lot of foods that are high in solid fats, added sugars, or sodium.   Maintain a healthy weight Body mass index (BMI) is used to identify weight problems. It estimates body fat based on height and weight. Your health care provider can help determine your BMI and help you achieve or maintain a healthy weight. Get regular exercise Get regular exercise. This is one of the most important things you can do for your health. Most adults should:  Exercise for at least 150 minutes each week. The exercise should increase your heart rate and make you sweat (moderate-intensity exercise).  Do strengthening exercises at least twice a week. This is in addition to the moderate-intensity exercise.  Spend less time sitting. Even light physical activity can be beneficial. Watch cholesterol and blood lipids Have your blood tested for lipids and cholesterol at 44 years of age, then have this test every 5 years. Have your cholesterol levels checked more often if:  Your lipid or cholesterol levels are high.  You are older than 44 years of age.  You are at high risk for heart disease. What should I know about cancer screening? Depending on your health history and family history, you may need to have cancer screening at various ages. This may include screening for:  Breast cancer.  Cervical cancer.  Colorectal cancer.  Skin cancer.  Lung cancer. What should I know about heart disease, diabetes, and high blood  pressure? Blood pressure and heart disease  High blood pressure causes heart disease and increases the risk of stroke. This is more likely to develop in people who have high blood pressure readings, are of African descent, or are overweight.  Have your blood pressure checked: ? Every 3-5 years if you are 18-39 years of age. ? Every year if you are 40 years old or older. Diabetes Have regular diabetes screenings. This checks your fasting blood sugar level. Have the screening done:  Once every three years after age 40 if you are at a normal weight and have a low risk for diabetes.  More often and at a younger age if you are overweight or have a high risk for diabetes. What should I know about preventing infection? Hepatitis B If you have a higher risk for hepatitis B, you should be screened for this virus. Talk with your health care provider to find out if you are at risk for hepatitis B infection. Hepatitis C Testing is recommended for:  Everyone born from 1945 through 1965.  Anyone with known risk factors for hepatitis C. Sexually transmitted infections (STIs)  Get screened for STIs, including gonorrhea and chlamydia, if: ? You are sexually active and are younger than 44 years of age. ? You are older than 44 years of age and your health care provider tells you that you are at risk for this type of infection. ? Your sexual activity has changed since you were last screened, and you are at increased risk for chlamydia or gonorrhea. Ask your health care provider   if you are at risk.  Ask your health care provider about whether you are at high risk for HIV. Your health care provider may recommend a prescription medicine to help prevent HIV infection. If you choose to take medicine to prevent HIV, you should first get tested for HIV. You should then be tested every 3 months for as long as you are taking the medicine. Pregnancy  If you are about to stop having your period (premenopausal) and  you may become pregnant, seek counseling before you get pregnant.  Take 400 to 800 micrograms (mcg) of folic acid every day if you become pregnant.  Ask for birth control (contraception) if you want to prevent pregnancy. Osteoporosis and menopause Osteoporosis is a disease in which the bones lose minerals and strength with aging. This can result in bone fractures. If you are 65 years old or older, or if you are at risk for osteoporosis and fractures, ask your health care provider if you should:  Be screened for bone loss.  Take a calcium or vitamin D supplement to lower your risk of fractures.  Be given hormone replacement therapy (HRT) to treat symptoms of menopause. Follow these instructions at home: Lifestyle  Do not use any products that contain nicotine or tobacco, such as cigarettes, e-cigarettes, and chewing tobacco. If you need help quitting, ask your health care provider.  Do not use street drugs.  Do not share needles.  Ask your health care provider for help if you need support or information about quitting drugs. Alcohol use  Do not drink alcohol if: ? Your health care provider tells you not to drink. ? You are pregnant, may be pregnant, or are planning to become pregnant.  If you drink alcohol: ? Limit how much you use to 0-1 drink a day. ? Limit intake if you are breastfeeding.  Be aware of how much alcohol is in your drink. In the [place] one drink equals one 12 oz bottle of beer (355 mL), one 5 oz glass of wine (148 mL), or one 1 oz glass of hard liquor (44 mL). General instructions  Schedule regular health, dental, and eye exams.  Stay current with your vaccines.  Tell your health care provider if: ? You often feel depressed. ? You have ever been abused or do not feel safe at home. Summary  Adopting a healthy lifestyle and getting preventive care are important in promoting health and wellness.  Follow your health care provider's instructions about healthy  diet, exercising, and getting tested or screened for diseases.  Follow your health care provider's instructions on monitoring your cholesterol and blood pressure. This information is not intended to replace advice given to you by your health care provider. Make sure you discuss any questions you have with your health care provider. Document Revised: 12/23/2017 Document Reviewed: 12/23/2017 Elsevier Patient Education  2021 Elsevier Inc.  

## 2020-05-03 LAB — RPR: RPR Ser Ql: NONREACTIVE

## 2020-05-03 LAB — SURESWAB CT/NG/T. VAGINALIS
C. trachomatis RNA, TMA: NOT DETECTED
N. gonorrhoeae RNA, TMA: NOT DETECTED
Trichomonas vaginalis RNA: NOT DETECTED

## 2020-05-03 LAB — HIV ANTIBODY (ROUTINE TESTING W REFLEX): HIV 1&2 Ab, 4th Generation: NONREACTIVE

## 2020-05-07 ENCOUNTER — Ambulatory Visit: Payer: BC Managed Care – PPO | Admitting: Professional

## 2020-05-08 NOTE — Progress Notes (Unsigned)
Office: [phone]  /  Fax: [phone]    Date: May 22, 2020   Appointment Start Time: *** Duration: *** minutes Provider: Lawerance Cruel, Psy.D. Type of Session: Individual Therapy  Location of Patient: {gbptloc:23249} Location of Provider: Provider's Home (private office) Type of Contact: Telepsychological Visit via MyChart Video Visit  Session Content: Robin Glover is a 45 y.o. female presenting for a follow-up appointment to address the previously established treatment goal of increasing coping skills. Today's appointment was a telepsychological visit due to COVID-19. Robin Glover provided verbal consent for today's telepsychological appointment and she is aware she is responsible for securing confidentiality on her end of the session. Prior to proceeding with today's appointment, Robin Glover's physical location at the time of this appointment was obtained as well a phone number she could be reached at in the event of technical difficulties. Robin Glover and this provider participated in today's telepsychological service.   This provider conducted a brief check-in and verbally administered the PHQ-9 and GAD-7. *** Robin Glover was receptive to today's appointment as evidenced by openness to sharing, responsiveness to feedback, and {gbreceptiveness:23401}.  Mental Status Examination:  Appearance: {Appearance:22431} Behavior: {Behavior:22445} Mood: {gbmood:21757} Affect: {Affect:22436} Speech: {Speech:22432} Eye Contact: {Eye Contact:22433} Psychomotor Activity: {Motor Activity:22434} Gait: {gbgait:23404} Thought Process: {thought process:22448}  Thought Content/Perception: {disturbances:22451} Orientation: {Orientation:22437} Memory/Concentration: {gbcognition:22449} Insight/Judgment: {Insight:22446}  Structured Assessments Results: The Patient Health Questionnaire-9 (PHQ-9) is a self-report measure that assesses symptoms and severity of depression over the course of the last two weeks. Robin Glover obtained a  score of *** suggesting {GBPHQ9SEVERITY:21752}. Robin Glover finds the endorsed symptoms to be {gbphq9difficulty:21754}. [0= Not at all; 1= Several days; 2= More than half the days; 3= Nearly every day] Little interest or pleasure in doing things ***  Feeling down, depressed, or hopeless ***  Trouble falling or staying asleep, or sleeping too much ***  Feeling tired or having little energy ***  Poor appetite or overeating ***  Feeling bad about yourself --- or that you are a failure or have let yourself or your family down ***  Trouble concentrating on things, such as reading the newspaper or watching television ***  Moving or speaking so slowly that other people could have noticed? Or the opposite --- being so fidgety or restless that you have been moving around a lot more than usual ***  Thoughts that you would be better off dead or hurting yourself in some way ***  PHQ-9 Score ***    The Generalized Anxiety Disorder-7 (GAD-7) is a brief self-report measure that assesses symptoms of anxiety over the course of the last two weeks. Robin Glover obtained a score of *** suggesting {gbgad7severity:21753}. Robin Glover finds the endorsed symptoms to be {gbphq9difficulty:21754}. [0= Not at all; 1= Several days; 2= Over half the days; 3= Nearly every day] Feeling nervous, anxious, on edge ***  Not being able to stop or control worrying ***  Worrying too much about different things ***  Trouble relaxing ***  Being so restless that it's hard to sit still ***  Becoming easily annoyed or irritable ***  Feeling afraid as if something awful might happen ***  GAD-7 Score ***   Interventions:  {Interventions for Progress Notes:23405}  DSM-5 Diagnosis(es): F32.89 Other Specified Depressive Disorder, Emotional Eating Behaviors  Treatment Goal & Progress: During the initial appointment with this provider, the following treatment goal was established: increase coping skills. Robin Glover has demonstrated progress in her goal as  evidenced by {gbtxprogress:22839}. Robin Glover also {gbtxprogress2:22951}.  Plan: The next appointment will be scheduled in {gbweeks:21758}, which will  be {gbtxmodality:23402}. The next session will focus on {Plan for Next Appointment:23400}.

## 2020-05-09 ENCOUNTER — Ambulatory Visit (INDEPENDENT_AMBULATORY_CARE_PROVIDER_SITE_OTHER): Payer: BC Managed Care – PPO | Admitting: Professional

## 2020-05-09 ENCOUNTER — Ambulatory Visit: Payer: BC Managed Care – PPO | Admitting: Professional

## 2020-05-09 DIAGNOSIS — F4323 Adjustment disorder with mixed anxiety and depressed mood: Secondary | ICD-10-CM | POA: Diagnosis not present

## 2020-05-21 ENCOUNTER — Other Ambulatory Visit: Payer: Self-pay

## 2020-05-21 ENCOUNTER — Encounter (INDEPENDENT_AMBULATORY_CARE_PROVIDER_SITE_OTHER): Payer: Self-pay | Admitting: Family Medicine

## 2020-05-21 ENCOUNTER — Ambulatory Visit (INDEPENDENT_AMBULATORY_CARE_PROVIDER_SITE_OTHER): Payer: BC Managed Care – PPO | Admitting: Family Medicine

## 2020-05-21 VITALS — BP 122/82 | HR 82 | Temp 98.7°F | Ht 66.0 in | Wt 223.0 lb

## 2020-05-21 DIAGNOSIS — F3289 Other specified depressive episodes: Secondary | ICD-10-CM

## 2020-05-21 DIAGNOSIS — Z6841 Body Mass Index (BMI) 40.0 and over, adult: Secondary | ICD-10-CM | POA: Diagnosis not present

## 2020-05-21 DIAGNOSIS — E66813 Obesity, class 3: Secondary | ICD-10-CM

## 2020-05-21 DIAGNOSIS — E1169 Type 2 diabetes mellitus with other specified complication: Secondary | ICD-10-CM

## 2020-05-21 NOTE — Progress Notes (Addendum)
Chief Complaint:   OBESITY Robin Glover is here to discuss her progress with her obesity treatment plan along with follow-up of her obesity related diagnoses. Robin Glover is on the Category 2 Plan, keeping a food journal and adhering to recommended goals of 400-500 calories and 35 grams of protein at supper daily, or following a lower carbohydrate, vegetable and lean protein rich diet plan and states she is following her eating plan approximately 50% of the time. Robin Glover states she is doing 0 minutes 0 times per week.  Today's visit was #: 20 Starting weight: 250 lbs Starting date: 02/09/2019 Today's weight: 223 lbs Today's date: 05/21/2020 Total lbs lost to date: 27 Total lbs lost since last in-office visit: 4  Interim History: Robin Glover says she did a mix of the Low carbohydrate plan and Category 2 plan. She fasted for church last week.  She says has a busy week planned and will be traveling but plans to adhere to plan as well as she can. Robin Glover's teenage daughter is quite supportive of her and offers her encouragement when she lacks motivation.  Subjective:   1. Type 2 diabetes mellitus with other specified complication, without long-term current use of insulin (HCC) Kristian's last A1c was 5.4, and her diabetes mellitus is well controlled. She is on Ozempic 0.5 mg weekly, and she notes some polyphagia.  Lab Results  Component Value Date   HGBA1C 5.4 04/25/2020   HGBA1C 5.7 (H) 10/31/2019   HGBA1C 5.7 (H) 06/09/2019   Lab Results  Component Value Date   LDLCALC 105 (H) 04/25/2020   CREATININE 0.80 04/25/2020   Lab Results  Component Value Date   INSULIN 10.6 10/31/2019   INSULIN 13.7 06/09/2019   INSULIN 12.5 02/09/2019   2. Other Specified Depressive Disorder, Emotional Eating Behaviors Floree has seen Dr. Dewaine Conger 1 time. She has decided to follow up with her own counselor rather than Dr. Dewaine Conger. Her daughter is supporting her and encouraging her. Robin Glover notes that her dad has been  less dependant on her recently which has lightened her load a bit. She has also been able to "let go" of trying to control how he eats. Robin Glover notes she is doing more self-care.   Assessment/Plan:   1. Type 2 diabetes mellitus with other specified complication, without long-term current use of insulin (HCC) We will discuss increasing Ozempic dose increase when [place] runs out in 2 months. Continue at 0.5 mg weekly for now.  2. Other Specified Depressive Disorder, Emotional Eating Behaviors . Taniaya will continue bupropion, and will follow up with a counselor.  3. Obesity: Current BMI 36.1 Robin Glover is currently in the action stage of change. As such, her goal is to continue with weight loss efforts. She has agreed to the Category 2 Plan.   Exercise goals: All adults should avoid inactivity. Some physical activity is better than none, and adults who participate in any amount of physical activity gain some health benefits.  Behavioral modification strategies: increasing lean protein intake, decreasing simple carbohydrates and travel eating strategies.  Robin Glover has agreed to follow-up with our clinic in 3 to 4 weeks.  Objective:   Blood pressure 122/82, pulse 82, temperature 98.7 F (37.1 C), height 5\' 6"  (1.676 m), weight 223 lb (101.2 kg), last menstrual period 04/27/2020, SpO2 100 %. Body mass index is 35.99 kg/m.  General: Cooperative, alert, well developed, in no acute distress. HEENT: Conjunctivae and lids unremarkable. Cardiovascular: Regular rhythm.  Lungs: Normal work of breathing. Neurologic: No focal deficits.  Lab Results  Component Value Date   CREATININE 0.80 04/25/2020   BUN 7 04/25/2020   NA 139 04/25/2020   K 4.5 04/25/2020   CL 103 04/25/2020   CO2 22 04/25/2020   Lab Results  Component Value Date   ALT 23 04/25/2020   AST 19 04/25/2020   ALKPHOS 62 04/25/2020   BILITOT 0.3 04/25/2020   Lab Results  Component Value Date   HGBA1C 5.4 04/25/2020   HGBA1C  5.7 (H) 10/31/2019   HGBA1C 5.7 (H) 06/09/2019   HGBA1C 5.8 (H) 02/09/2019   HGBA1C 6.0 (H) 01/15/2015   Lab Results  Component Value Date   INSULIN 10.6 10/31/2019   INSULIN 13.7 06/09/2019   INSULIN 12.5 02/09/2019   Lab Results  Component Value Date   TSH 3.660 06/09/2019   Lab Results  Component Value Date   CHOL 166 04/25/2020   HDL 50 04/25/2020   LDLCALC 105 (H) 04/25/2020   TRIG 54 04/25/2020   CHOLHDL 4.4 01/15/2015   Lab Results  Component Value Date   WBC 6.5 06/09/2019   HGB 13.0 06/09/2019   HCT 39.2 06/09/2019   MCV 92 06/09/2019   PLT 287 06/09/2019   Lab Results  Component Value Date   IRON 88 06/09/2019   TIBC 351 06/09/2019   FERRITIN 73 06/09/2019   Attestation Statements:   Reviewed by clinician on day of visit: allergies, medications, problem list, medical history, surgical history, family history, social history, and previous encounter notes.   Trude Mcburney, am acting as Energy manager for [place], FNP-C.  I have reviewed the above documentation for accuracy and completeness, and I agree with the above. -  Jesse Sans, FNP

## 2020-05-22 ENCOUNTER — Encounter (INDEPENDENT_AMBULATORY_CARE_PROVIDER_SITE_OTHER): Payer: Self-pay | Admitting: Family Medicine

## 2020-05-22 ENCOUNTER — Telehealth (INDEPENDENT_AMBULATORY_CARE_PROVIDER_SITE_OTHER): Payer: BC Managed Care – PPO | Admitting: Psychology

## 2020-05-23 ENCOUNTER — Ambulatory Visit: Payer: BC Managed Care – PPO | Admitting: Professional

## 2020-06-06 ENCOUNTER — Ambulatory Visit: Payer: BC Managed Care – PPO | Admitting: Professional

## 2020-06-08 ENCOUNTER — Ambulatory Visit (INDEPENDENT_AMBULATORY_CARE_PROVIDER_SITE_OTHER): Payer: BC Managed Care – PPO | Admitting: Professional

## 2020-06-08 DIAGNOSIS — F4323 Adjustment disorder with mixed anxiety and depressed mood: Secondary | ICD-10-CM

## 2020-06-18 ENCOUNTER — Ambulatory Visit (INDEPENDENT_AMBULATORY_CARE_PROVIDER_SITE_OTHER): Payer: BC Managed Care – PPO | Admitting: Family Medicine

## 2020-06-18 ENCOUNTER — Encounter (INDEPENDENT_AMBULATORY_CARE_PROVIDER_SITE_OTHER): Payer: Self-pay | Admitting: Family Medicine

## 2020-06-18 ENCOUNTER — Other Ambulatory Visit: Payer: Self-pay

## 2020-06-18 VITALS — BP 125/81 | HR 81 | Temp 98.8°F | Ht 66.0 in | Wt 225.0 lb

## 2020-06-18 DIAGNOSIS — F3289 Other specified depressive episodes: Secondary | ICD-10-CM

## 2020-06-18 DIAGNOSIS — E559 Vitamin D deficiency, unspecified: Secondary | ICD-10-CM | POA: Diagnosis not present

## 2020-06-18 DIAGNOSIS — E1169 Type 2 diabetes mellitus with other specified complication: Secondary | ICD-10-CM | POA: Diagnosis not present

## 2020-06-18 DIAGNOSIS — Z6841 Body Mass Index (BMI) 40.0 and over, adult: Secondary | ICD-10-CM

## 2020-06-18 MED ORDER — OZEMPIC (1 MG/DOSE) 2 MG/1.5ML ~~LOC~~ SOPN: 1.0000 mg | PEN_INJECTOR | SUBCUTANEOUS | 0 refills | Status: DC

## 2020-06-20 ENCOUNTER — Ambulatory Visit: Payer: BC Managed Care – PPO | Admitting: Professional

## 2020-06-21 NOTE — Progress Notes (Signed)
Chief Complaint:   OBESITY Robin Glover is here to discuss her progress with her obesity treatment plan along with follow-up of her obesity related diagnoses. Robin Glover is on the Category 2 Plan and states she is following her eating plan approximately 40% of the time. Robin Glover states she is not exercising regularly.  Today's visit was #: 21 Starting weight: 250 lbs Starting date: 02/09/2019 Today's weight: 225 lbs Today's date: 06/18/2020 Total lbs lost to date: 25 lbs Total lbs lost since last in-office visit: +2 lbs  Interim History: Robin Glover has followed Category 2 mostly versus low carb.  She had her birthday last month and she says she celebrated all month.  Subjective:   1. Vitamin D deficiency Vitamin D at goal.  Has been on prescription vitamin D every 3 days.  2. Type 2 diabetes mellitus with other specified complication, without long-term current use of insulin (HCC) Well controlled.  She endorses hunger.  Last A1c was 5.4.  Lab Results  Component Value Date   HGBA1C 5.4 04/25/2020   HGBA1C 5.7 (H) 10/31/2019   HGBA1C 5.7 (H) 06/09/2019   Lab Results  Component Value Date   LDLCALC 105 (H) 04/25/2020   CREATININE 0.80 04/25/2020   Lab Results  Component Value Date   INSULIN 10.6 10/31/2019   INSULIN 13.7 06/09/2019   INSULIN 12.5 02/09/2019   3. Other depression, with emotional eating She has been taking bupropion only once daily.  Stress eating has been well controlled.  She is only taking 1 tablet of bupropion per day.  Assessment/Plan:   1. Vitamin D deficiency Change to taking vitamin D once weekly.  2. Type 2 diabetes mellitus with other specified complication, without long-term current use of insulin (HCC) Increase dose of Ozempic, as per below.  - Increase and refill semaglutide, 1 MG/DOSE, (OZEMPIC, 1 MG/DOSE,) 2 MG/1.5ML SOPN; Inject 1 mg into the skin once a week.  Dispense: 9 mL; Refill: 0  3. Other depression, with emotional eating Continue  bupropion at 1 tablet per day.  4. Obesity: Current BMI 36.33  Robin Glover is currently in the action stage of change. As such, her goal is to continue with weight loss efforts. She has agreed to the Category 2 Plan and following a lower carbohydrate, vegetable and lean protein rich diet plan.   Exercise goals: No exercise has been prescribed at this time.  Behavioral modification strategies: decreasing simple carbohydrates and better snacking choices.  Robin Glover has agreed to follow-up with our clinic in 3-4 weeks.  Objective:   Blood pressure 125/81, pulse 81, temperature 98.8 F (37.1 C), height 5\' 6"  (1.676 m), weight 225 lb (102.1 kg), SpO2 99 %. Body mass index is 36.32 kg/m.  General: Cooperative, alert, well developed, in no acute distress. HEENT: Conjunctivae and lids unremarkable. Cardiovascular: Regular rhythm.  Lungs: Normal work of breathing. Neurologic: No focal deficits.   Lab Results  Component Value Date   CREATININE 0.80 04/25/2020   BUN 7 04/25/2020   NA 139 04/25/2020   K 4.5 04/25/2020   CL 103 04/25/2020   CO2 22 04/25/2020   Lab Results  Component Value Date   ALT 23 04/25/2020   AST 19 04/25/2020   ALKPHOS 62 04/25/2020   BILITOT 0.3 04/25/2020   Lab Results  Component Value Date   HGBA1C 5.4 04/25/2020   HGBA1C 5.7 (H) 10/31/2019   HGBA1C 5.7 (H) 06/09/2019   HGBA1C 5.8 (H) 02/09/2019   HGBA1C 6.0 (H) 01/15/2015   Lab Results  Component Value Date   INSULIN 10.6 10/31/2019   INSULIN 13.7 06/09/2019   INSULIN 12.5 02/09/2019   Lab Results  Component Value Date   TSH 3.660 06/09/2019   Lab Results  Component Value Date   CHOL 166 04/25/2020   HDL 50 04/25/2020   LDLCALC 105 (H) 04/25/2020   TRIG 54 04/25/2020   CHOLHDL 4.4 01/15/2015   Lab Results  Component Value Date   WBC 6.5 06/09/2019   HGB 13.0 06/09/2019   HCT 39.2 06/09/2019   MCV 92 06/09/2019   PLT 287 06/09/2019   Lab Results  Component Value Date   IRON 88  06/09/2019   TIBC 351 06/09/2019   FERRITIN 73 06/09/2019   Attestation Statements:   Reviewed by clinician on day of visit: allergies, medications, problem list, medical history, surgical history, family history, social history, and previous encounter notes.  I, Insurance claims handler, CMA, am acting as Energy manager for [place], FNP.  I have reviewed the above documentation for accuracy and completeness, and I agree with the above. - Jesse Sans, FNP

## 2020-06-24 ENCOUNTER — Encounter (INDEPENDENT_AMBULATORY_CARE_PROVIDER_SITE_OTHER): Payer: Self-pay | Admitting: Family Medicine

## 2020-07-04 ENCOUNTER — Ambulatory Visit (INDEPENDENT_AMBULATORY_CARE_PROVIDER_SITE_OTHER): Payer: BC Managed Care – PPO | Admitting: Professional

## 2020-07-04 DIAGNOSIS — F4323 Adjustment disorder with mixed anxiety and depressed mood: Secondary | ICD-10-CM | POA: Diagnosis not present

## 2020-07-18 ENCOUNTER — Ambulatory Visit: Payer: BC Managed Care – PPO | Admitting: Professional

## 2020-07-19 ENCOUNTER — Ambulatory Visit (INDEPENDENT_AMBULATORY_CARE_PROVIDER_SITE_OTHER): Payer: BC Managed Care – PPO | Admitting: Family Medicine

## 2020-07-23 ENCOUNTER — Encounter (INDEPENDENT_AMBULATORY_CARE_PROVIDER_SITE_OTHER): Payer: Self-pay | Admitting: Family Medicine

## 2020-07-23 ENCOUNTER — Ambulatory Visit (INDEPENDENT_AMBULATORY_CARE_PROVIDER_SITE_OTHER): Payer: BC Managed Care – PPO | Admitting: Family Medicine

## 2020-07-23 ENCOUNTER — Other Ambulatory Visit: Payer: Self-pay

## 2020-07-23 VITALS — BP 123/85 | HR 78 | Temp 98.0°F | Ht 66.0 in | Wt 224.0 lb

## 2020-07-23 DIAGNOSIS — E1169 Type 2 diabetes mellitus with other specified complication: Secondary | ICD-10-CM | POA: Diagnosis not present

## 2020-07-23 DIAGNOSIS — Z6841 Body Mass Index (BMI) 40.0 and over, adult: Secondary | ICD-10-CM | POA: Diagnosis not present

## 2020-07-23 DIAGNOSIS — E66813 Obesity, class 3: Secondary | ICD-10-CM

## 2020-07-30 NOTE — Progress Notes (Signed)
Chief Complaint:   OBESITY Robin Glover is here to discuss her progress with her obesity treatment plan along with follow-up of her obesity related diagnoses. Robin Glover is on the Category 2 Plan and following a lower carbohydrate, vegetable and lean protein rich diet plan and states she is following her eating plan approximately 40% of the time. Robin Glover states she is walking 10,000 steps 4-5 times per week.  Today's visit was #: 22 Starting weight: 250 lbs Starting date: 02/09/2019 Today's weight: 224 lbs Today's date: 07/23/2020 Total lbs lost to date: 26 lbs Total lbs lost since last in-office visit: 1 lb  Interim History: Robin Glover is vacillating back and forth between low carb and Category 2. She feels she needs to commit to one or the other.  Subjective:   1. Type 2 diabetes mellitus with other specified complication, without long-term current use of insulin (HCC) Robin Glover's diabetes is well controlled. We increased her Ozempic dose at her last office visit. She denies any nausea or constipation. Her last A1C was 5.4.  Lab Results  Component Value Date   HGBA1C 5.4 04/25/2020   HGBA1C 5.7 (H) 10/31/2019   HGBA1C 5.7 (H) 06/09/2019   Lab Results  Component Value Date   LDLCALC 105 (H) 04/25/2020   CREATININE 0.80 04/25/2020   Lab Results  Component Value Date   INSULIN 10.6 10/31/2019   INSULIN 13.7 06/09/2019   INSULIN 12.5 02/09/2019    Assessment/Plan:   1. Type 2 diabetes mellitus with other specified complication, without long-term current use of insulin (HCC) Robin Glover will continue taking Ozempic.  2. Obesity: Current BMI 36.17 Robin Glover is currently in the action stage of change. As such, her goal is to continue with weight loss efforts. She has agreed to the Category 2 Plan.   Exercise goals:  As is.  Behavioral modification strategies: increasing lean protein intake and decreasing simple carbohydrates.  Robin Glover has agreed to follow-up with our clinic in 3-4 weeks.   Objective:   Blood pressure 123/85, pulse 78, temperature 98 F (36.7 C), height 5\' 6"  (1.676 m), weight 224 lb (101.6 kg), SpO2 99 %. Body mass index is 36.15 kg/m.  General: Cooperative, alert, well developed, in no acute distress. HEENT: Conjunctivae and lids unremarkable. Cardiovascular: Regular rhythm.  Lungs: Normal work of breathing. Neurologic: No focal deficits.   Lab Results  Component Value Date   CREATININE 0.80 04/25/2020   BUN 7 04/25/2020   NA 139 04/25/2020   K 4.5 04/25/2020   CL 103 04/25/2020   CO2 22 04/25/2020   Lab Results  Component Value Date   ALT 23 04/25/2020   AST 19 04/25/2020   ALKPHOS 62 04/25/2020   BILITOT 0.3 04/25/2020   Lab Results  Component Value Date   HGBA1C 5.4 04/25/2020   HGBA1C 5.7 (H) 10/31/2019   HGBA1C 5.7 (H) 06/09/2019   HGBA1C 5.8 (H) 02/09/2019   HGBA1C 6.0 (H) 01/15/2015   Lab Results  Component Value Date   INSULIN 10.6 10/31/2019   INSULIN 13.7 06/09/2019   INSULIN 12.5 02/09/2019   Lab Results  Component Value Date   TSH 3.660 06/09/2019   Lab Results  Component Value Date   CHOL 166 04/25/2020   HDL 50 04/25/2020   LDLCALC 105 (H) 04/25/2020   TRIG 54 04/25/2020   CHOLHDL 4.4 01/15/2015   Lab Results  Component Value Date   VD25OH 52.2 04/25/2020   VD25OH 39.6 10/31/2019   VD25OH 36.3 06/09/2019   Lab Results  Component Value Date   WBC 6.5 06/09/2019   HGB 13.0 06/09/2019   HCT 39.2 06/09/2019   MCV 92 06/09/2019   PLT 287 06/09/2019   Lab Results  Component Value Date   IRON 88 06/09/2019   TIBC 351 06/09/2019   FERRITIN 73 06/09/2019   Attestation Statements:   Reviewed by clinician on day of visit: allergies, medications, problem list, medical history, surgical history, family history, social history, and previous encounter notes.  IJackson Latino, am acting as Energy manager for [place], FNP.  I have reviewed the above documentation for accuracy and completeness, and  I agree with the above. -  Jesse Sans, FNP

## 2020-07-31 ENCOUNTER — Other Ambulatory Visit (INDEPENDENT_AMBULATORY_CARE_PROVIDER_SITE_OTHER): Payer: Self-pay | Admitting: Family Medicine

## 2020-07-31 DIAGNOSIS — M25511 Pain in right shoulder: Secondary | ICD-10-CM | POA: Diagnosis not present

## 2020-07-31 DIAGNOSIS — F3289 Other specified depressive episodes: Secondary | ICD-10-CM

## 2020-07-31 MED ORDER — BUPROPION HCL ER (SR) 150 MG PO TB12: 150.0000 mg | ORAL_TABLET | Freq: Two times a day (BID) | ORAL | 0 refills | Status: DC

## 2020-07-31 NOTE — Telephone Encounter (Signed)
LAST APPOINTMENT DATE: 07/23/20 NEXT APPOINTMENT DATE: 08/13/20   [id number] - [place], [place] - [address] Damien Fusi [place] [address] Phone: [phone] Fax: [phone]  Patient is requesting a refill of the following medications: Requested Prescriptions   Pending Prescriptions Disp Refills   buPROPion (WELLBUTRIN XL) 150 MG 24 hr tablet [Pharmacy Med Name: BUPROPION HCL XL 150 MG TABLET] 180 tablet     Sig: TAKE 1 TABLET BY MOUTH TWICE A DAY    Date last filled: 04/25/20 Previously prescribed by [facility]  Lab Results  Component Value Date   HGBA1C 5.4 04/25/2020   HGBA1C 5.7 (H) 10/31/2019   HGBA1C 5.7 (H) 06/09/2019   Lab Results  Component Value Date   LDLCALC 105 (H) 04/25/2020   CREATININE 0.80 04/25/2020   Lab Results  Component Value Date   VD25OH 52.2 04/25/2020   VD25OH 39.6 10/31/2019   VD25OH 36.3 06/09/2019    BP Readings from Last 3 Encounters:  07/23/20 123/85  06/18/20 125/81  05/21/20 122/82

## 2020-07-31 NOTE — Telephone Encounter (Signed)
Last OV with Dawn 

## 2020-08-01 ENCOUNTER — Ambulatory Visit (INDEPENDENT_AMBULATORY_CARE_PROVIDER_SITE_OTHER): Payer: BC Managed Care – PPO | Admitting: Professional

## 2020-08-01 DIAGNOSIS — F4323 Adjustment disorder with mixed anxiety and depressed mood: Secondary | ICD-10-CM

## 2020-08-13 ENCOUNTER — Encounter (INDEPENDENT_AMBULATORY_CARE_PROVIDER_SITE_OTHER): Payer: Self-pay | Admitting: Family Medicine

## 2020-08-13 ENCOUNTER — Other Ambulatory Visit: Payer: Self-pay

## 2020-08-13 ENCOUNTER — Ambulatory Visit (INDEPENDENT_AMBULATORY_CARE_PROVIDER_SITE_OTHER): Payer: BC Managed Care – PPO | Admitting: Family Medicine

## 2020-08-13 VITALS — BP 115/77 | HR 85 | Temp 98.3°F | Ht 66.0 in | Wt 227.0 lb

## 2020-08-13 DIAGNOSIS — E1169 Type 2 diabetes mellitus with other specified complication: Secondary | ICD-10-CM | POA: Diagnosis not present

## 2020-08-13 DIAGNOSIS — F3289 Other specified depressive episodes: Secondary | ICD-10-CM | POA: Diagnosis not present

## 2020-08-13 DIAGNOSIS — Z6841 Body Mass Index (BMI) 40.0 and over, adult: Secondary | ICD-10-CM

## 2020-08-13 DIAGNOSIS — E66813 Obesity, class 3: Secondary | ICD-10-CM

## 2020-08-13 DIAGNOSIS — E559 Vitamin D deficiency, unspecified: Secondary | ICD-10-CM

## 2020-08-14 ENCOUNTER — Encounter (INDEPENDENT_AMBULATORY_CARE_PROVIDER_SITE_OTHER): Payer: Self-pay | Admitting: Family Medicine

## 2020-08-14 NOTE — Progress Notes (Signed)
Chief Complaint:   OBESITY Robin Glover is here to discuss her progress with her obesity treatment plan along with follow-up of her obesity related diagnoses. Nandana is on the Category 2 Plan and states she is following her eating plan approximately 20% of the time. Rudie states she is doing 8,00-10,000 steps 7 times per week.  Today's visit was #: 23 Starting weight: 250 lbs Starting date: 02/09/2019 Today's weight: 227 lbs Today's date: 08/13/2020 Total lbs lost to date: 23 Total lbs lost since last in-office visit: +3  Interim History: Robin Glover notes that she has been eating too much ice cream. She has not decided on one plan or the other (low carb or category 2) and therefore has not been following either one. She gets 8000-10000 steps at work due to taking steps versus the elevator.  Subjective:   1. Other depression with emotional eating  Robin Glover notes a lot of stress eating due to family issues. She is on bupropion 150 mg. She is prescribed BID but taking QD.   2. Type 2 diabetes mellitus with other specified complication, without long-term current use of insulin (HCC) Well controlled. Robin Glover is on Ozempic 1 mg. She notes less hunger due to Ozempic.   Lab Results  Component Value Date   HGBA1C 5.4 04/25/2020   HGBA1C 5.7 (H) 10/31/2019   HGBA1C 5.7 (H) 06/09/2019   Lab Results  Component Value Date   LDLCALC 105 (H) 04/25/2020   CREATININE 0.80 04/25/2020   Lab Results  Component Value Date   INSULIN 10.6 10/31/2019   INSULIN 13.7 06/09/2019   INSULIN 12.5 02/09/2019   Assessment/Plan:   1. Other depression with emotional eating  Continue bupropion once daily.  2. Type 2 diabetes mellitus with other specified complication, without long-term current use of insulin (HCC) Continue Ozempic.   3. Obesity: Current BMI 36.66  Robin Glover is currently in the action stage of change. As such, her goal is to continue with weight loss efforts. She has agreed to the Category 2  Plan.   Exercise goals:  As is  Behavioral modification strategies: decreasing simple carbohydrates.  Bergen has agreed to follow-up with our clinic in 3 weeks.  Objective:   Blood pressure 115/77, pulse 85, temperature 98.3 F (36.8 C), height 5\' 6"  (1.676 m), weight 227 lb (103 kg), SpO2 98 %. Body mass index is 36.64 kg/m.  General: Cooperative, alert, well developed, in no acute distress. HEENT: Conjunctivae and lids unremarkable. Cardiovascular: Regular rhythm.  Lungs: Normal work of breathing. Neurologic: No focal deficits.   Lab Results  Component Value Date   CREATININE 0.80 04/25/2020   BUN 7 04/25/2020   NA 139 04/25/2020   K 4.5 04/25/2020   CL 103 04/25/2020   CO2 22 04/25/2020   Lab Results  Component Value Date   ALT 23 04/25/2020   AST 19 04/25/2020   ALKPHOS 62 04/25/2020   BILITOT 0.3 04/25/2020   Lab Results  Component Value Date   HGBA1C 5.4 04/25/2020   HGBA1C 5.7 (H) 10/31/2019   HGBA1C 5.7 (H) 06/09/2019   HGBA1C 5.8 (H) 02/09/2019   HGBA1C 6.0 (H) 01/15/2015   Lab Results  Component Value Date   INSULIN 10.6 10/31/2019   INSULIN 13.7 06/09/2019   INSULIN 12.5 02/09/2019   Lab Results  Component Value Date   TSH 3.660 06/09/2019   Lab Results  Component Value Date   CHOL 166 04/25/2020   HDL 50 04/25/2020   LDLCALC 105 (H) 04/25/2020  TRIG 54 04/25/2020   CHOLHDL 4.4 01/15/2015   Lab Results  Component Value Date   VD25OH 52.2 04/25/2020   VD25OH 39.6 10/31/2019   VD25OH 36.3 06/09/2019   Lab Results  Component Value Date   WBC 6.5 06/09/2019   HGB 13.0 06/09/2019   HCT 39.2 06/09/2019   MCV 92 06/09/2019   PLT 287 06/09/2019   Lab Results  Component Value Date   IRON 88 06/09/2019   TIBC 351 06/09/2019   FERRITIN 73 06/09/2019    Attestation Statements:   Reviewed by clinician on day of visit: allergies, medications, problem list, medical history, surgical history, family history, social history, and previous  encounter notes.  Edmund Hilda, CMA, am acting as Energy manager for [place], FNP.  I have reviewed the above documentation for accuracy and completeness, and I agree with the above. -  Jesse Sans, FNP

## 2020-08-15 ENCOUNTER — Ambulatory Visit: Payer: BC Managed Care – PPO | Admitting: Professional

## 2020-08-29 ENCOUNTER — Ambulatory Visit (INDEPENDENT_AMBULATORY_CARE_PROVIDER_SITE_OTHER): Payer: BC Managed Care – PPO | Admitting: Professional

## 2020-08-29 DIAGNOSIS — F4323 Adjustment disorder with mixed anxiety and depressed mood: Secondary | ICD-10-CM

## 2020-09-03 ENCOUNTER — Ambulatory Visit (INDEPENDENT_AMBULATORY_CARE_PROVIDER_SITE_OTHER): Payer: BC Managed Care – PPO | Admitting: Adult Health

## 2020-09-05 ENCOUNTER — Encounter (INDEPENDENT_AMBULATORY_CARE_PROVIDER_SITE_OTHER): Payer: Self-pay

## 2020-09-05 ENCOUNTER — Ambulatory Visit (INDEPENDENT_AMBULATORY_CARE_PROVIDER_SITE_OTHER): Payer: BC Managed Care – PPO | Admitting: Family Medicine

## 2020-09-26 ENCOUNTER — Ambulatory Visit: Payer: BC Managed Care – PPO | Admitting: Professional

## 2020-10-02 ENCOUNTER — Ambulatory Visit (INDEPENDENT_AMBULATORY_CARE_PROVIDER_SITE_OTHER): Payer: BC Managed Care – PPO | Admitting: Professional

## 2020-10-02 DIAGNOSIS — F4323 Adjustment disorder with mixed anxiety and depressed mood: Secondary | ICD-10-CM

## 2020-10-24 DIAGNOSIS — Z1231 Encounter for screening mammogram for malignant neoplasm of breast: Secondary | ICD-10-CM | POA: Diagnosis not present

## 2020-10-31 ENCOUNTER — Ambulatory Visit (INDEPENDENT_AMBULATORY_CARE_PROVIDER_SITE_OTHER): Payer: BC Managed Care – PPO | Admitting: Professional

## 2020-10-31 DIAGNOSIS — F4323 Adjustment disorder with mixed anxiety and depressed mood: Secondary | ICD-10-CM | POA: Diagnosis not present

## 2020-11-09 DIAGNOSIS — Z20822 Contact with and (suspected) exposure to covid-19: Secondary | ICD-10-CM | POA: Diagnosis not present

## 2020-11-15 DIAGNOSIS — E8881 Metabolic syndrome: Secondary | ICD-10-CM | POA: Diagnosis not present

## 2020-11-15 DIAGNOSIS — E785 Hyperlipidemia, unspecified: Secondary | ICD-10-CM | POA: Diagnosis not present

## 2020-11-15 DIAGNOSIS — I10 Essential (primary) hypertension: Secondary | ICD-10-CM | POA: Diagnosis not present

## 2020-11-15 DIAGNOSIS — E1169 Type 2 diabetes mellitus with other specified complication: Secondary | ICD-10-CM | POA: Diagnosis not present

## 2020-11-16 DIAGNOSIS — H40023 Open angle with borderline findings, high risk, bilateral: Secondary | ICD-10-CM | POA: Diagnosis not present

## 2020-11-16 DIAGNOSIS — H25813 Combined forms of age-related cataract, bilateral: Secondary | ICD-10-CM | POA: Diagnosis not present

## 2020-11-16 DIAGNOSIS — R7303 Prediabetes: Secondary | ICD-10-CM | POA: Diagnosis not present

## 2020-11-28 ENCOUNTER — Ambulatory Visit: Payer: BC Managed Care – PPO | Admitting: Professional

## 2020-12-23 ENCOUNTER — Emergency Department (HOSPITAL_BASED_OUTPATIENT_CLINIC_OR_DEPARTMENT_OTHER): Payer: BC Managed Care – PPO

## 2020-12-23 ENCOUNTER — Encounter (HOSPITAL_BASED_OUTPATIENT_CLINIC_OR_DEPARTMENT_OTHER): Payer: Self-pay

## 2020-12-23 ENCOUNTER — Emergency Department (HOSPITAL_BASED_OUTPATIENT_CLINIC_OR_DEPARTMENT_OTHER)
Admission: EM | Admit: 2020-12-23 | Discharge: 2020-12-24 | Disposition: A | Discharge status: Home / Self Care | Payer: BC Managed Care – PPO | Attending: Emergency Medicine | Admitting: Emergency Medicine

## 2020-12-23 ENCOUNTER — Other Ambulatory Visit: Payer: Self-pay

## 2020-12-23 DIAGNOSIS — E119 Type 2 diabetes mellitus without complications: Secondary | ICD-10-CM | POA: Diagnosis not present

## 2020-12-23 DIAGNOSIS — S6992XA Unspecified injury of left wrist, hand and finger(s), initial encounter: Secondary | ICD-10-CM | POA: Diagnosis not present

## 2020-12-23 DIAGNOSIS — W228XXA Striking against or struck by other objects, initial encounter: Secondary | ICD-10-CM | POA: Insufficient documentation

## 2020-12-23 DIAGNOSIS — I10 Essential (primary) hypertension: Secondary | ICD-10-CM | POA: Diagnosis not present

## 2020-12-23 DIAGNOSIS — Z79899 Other long term (current) drug therapy: Secondary | ICD-10-CM | POA: Diagnosis not present

## 2020-12-23 DIAGNOSIS — M7989 Other specified soft tissue disorders: Secondary | ICD-10-CM | POA: Diagnosis not present

## 2020-12-23 DIAGNOSIS — S62525A Nondisplaced fracture of distal phalanx of left thumb, initial encounter for closed fracture: Secondary | ICD-10-CM | POA: Insufficient documentation

## 2020-12-23 NOTE — ED Triage Notes (Signed)
Pt reports during her nativity show another cast member ran into her thumb. She applied ice and took 4 ibuprofen tabs w/no relief.

## 2020-12-24 NOTE — Discharge Instructions (Signed)
You were seen today and found to have a fracture of your left thumb.  Keep splint in place.  Ice and elevate.  Follow-up with orthopedics as an outpatient.  You may take Tylenol or ibuprofen as needed for pain.

## 2020-12-24 NOTE — ED Provider Notes (Signed)
[facility] EMERGENCY DEPT Provider Note   CSN: [id number] Arrival date & time: 12/23/20  2314     History Chief Complaint  Patient presents with   Hand Injury    Robin Glover is a 45 y.o. female.  HPI     This a 45 year old female with history of diabetes, hypertension, hypercholesterolemia who presents with injury.  Patient reports that she had an injury to her right thumb.  She was dancing in a production when another performer ran into her left thumb.  She states that since that time she has noted some numbness and throbbing pain in the thumb.  She has used ice and taking ibuprofen with some improvement.  She is right-handed.  Past Medical History:  Diagnosis Date   Anxiety    Chronic back pain    Diabetes (HCC)    Food allergy    Gallbladder problem    GERD (gastroesophageal reflux disease)    High cholesterol    Hormone disorder    Hypertension    IBS (irritable bowel syndrome)    Lactose intolerance    Pre-diabetes    Sleep apnea     Patient Active Problem List   Diagnosis Date Noted   Depression 11/01/2019   Class 3 severe obesity with serious comorbidity and body mass index (BMI) of 40.0 to 44.9 in adult (HCC) 06/09/2019   Hyperlipidemia associated with type 2 diabetes mellitus (HCC) 03/24/2019   Vitamin D deficiency 02/14/2019   Class 2 severe obesity with serious comorbidity and body mass index (BMI) of 37.0 to 37.9 in adult (HCC) 02/09/2019   Hypercholesteremia 04/19/2018   Diabetes mellitus (HCC) 04/19/2018   CIN I (cervical intraepithelial neoplasia I) 01/23/2014   Family history of breast cancer 01/23/2014   Essential hypertension, benign 12/08/2012    Past Surgical History:  Procedure Laterality Date   CESAREAN SECTION     CHOLECYSTECTOMY       OB History     Gravida  1   Para  1   Term  1   Preterm      AB      Living  1      SAB      IAB      Ectopic      Multiple      Live Births               Family History  Problem Relation Age of Onset   Breast cancer Mother 103   Diabetes Mother    Hypertension Mother    High Cholesterol Mother    Obesity Mother    Heart disease Father    Diabetes Father    High blood pressure Father    High Cholesterol Father    Obesity Father    Sleep apnea Father    Diabetes Brother    Hypertension Brother     Social History   Tobacco Use   Smoking status: Never   Smokeless tobacco: Never  Vaping Use   Vaping Use: Never used  Substance Use Topics   Alcohol use: Yes    Comment: Occas   Drug use: No    Home Medications Prior to Admission medications   Medication Sig Start Date End Date Taking? Authorizing Provider  atorvastatin (LIPITOR) 20 MG tablet Take 20 mg by mouth daily. 03/11/19   [provider]  buPROPion (WELLBUTRIN SR) 150 MG 12 hr tablet Take 150 mg by mouth daily.    [provider]  diphenhydrAMINE (BENADRYL) 25 MG tablet Take 25 mg by mouth every 6 (six) hours as needed.    [provider]  lisinopril (ZESTRIL) 10 MG tablet Take 10 mg by mouth daily.    [provider]  Multiple Vitamin (MULTIVITAMIN) tablet Take 1 tablet by mouth daily.    [provider]  norethindrone (ORTHO MICRONOR) 0.35 MG tablet Take 1 tablet (0.35 mg total) by mouth daily. 05/02/20   Wyline Beady A, NP  Semaglutide, 1 MG/DOSE, (OZEMPIC, 1 MG/DOSE,) 2 MG/1.5ML SOPN Inject 1 mg into the skin once a week. 06/18/20   [place]ire, Thermon Leyland, FNP  Vitamin D, Ergocalciferol, (DRISDOL) 1.25 MG (50000 UNIT) CAPS capsule Take 1 capsule (50,000 Units total) by mouth every 3 (three) days. 04/25/20   Whitmire, Thermon Leyland, FNP    Allergies    Almond oil, Soma [carisoprodol], and Tart cherry advanced [germanium]  Review of Systems   Review of Systems  Constitutional:  Negative for fever.  Musculoskeletal:        Left thumb pain  Skin:  Negative for wound.  Neurological:  Positive for numbness.  All other systems  reviewed and are negative.  Physical Exam Updated Vital Signs BP 130/74   Pulse 86   Temp 98.1 F (36.7 C) (Oral)   Resp 16   SpO2 99%   Physical Exam Vitals and nursing note reviewed.  Constitutional:      Appearance: She is well-developed. She is obese. She is not ill-appearing.  HENT:     Head: Normocephalic and atraumatic.     Mouth/Throat:     Mouth: Mucous membranes are moist.  Eyes:     Pupils: Pupils are equal, round, and reactive to light.  Cardiovascular:     Rate and Rhythm: Normal rate and regular rhythm.  Pulmonary:     Effort: Pulmonary effort is normal. No respiratory distress.  Abdominal:     Palpations: Abdomen is soft.  Musculoskeletal:     Cervical back: Neck supple.     Comments: Tenderness to palpation of the distal phalanx with noted hematoma over the palmar aspect of the thumb distally, limited flexion and extension of the thumb secondary to pain, neurovascular intact  Skin:    General: Skin is warm and dry.  Neurological:     Mental Status: She is alert and oriented to person, place, and time.  Psychiatric:        Mood and Affect: Mood normal.    ED Results / Procedures / Treatments   Labs (all labs ordered are listed, but only abnormal results are displayed) Labs Reviewed - No data to display  EKG None  Radiology DG Hand Complete Left  Result Date: 12/24/2020 CLINICAL DATA:  Thumb injury EXAM: LEFT HAND - COMPLETE 3+ VIEW COMPARISON:  None. FINDINGS: Comminuted fracture involving the proximal aspect of the 1st distal phalanx, with dominant transverse component but with vertical intra-articular extension. This is essentially nondisplaced. Associated soft tissue swelling. IMPRESSION: Comminuted intra-articular fracture involving the 1st distal phalanx, as above. Electronically Signed   By: Charline Bills M.D.   On: 12/24/2020 00:17    Procedures Procedures   Medications Ordered in ED Medications - No data to display  ED Course  I  have reviewed the triage vital signs and the nursing notes.  Pertinent labs & imaging results that were available during my care of the patient were reviewed by me and considered in my medical decision making (see chart for details).    MDM Rules/Calculators/A&P  Patient presents with left thumb injury.  She is nontoxic and vital signs are reassuring.  She appears neurovascular intact.  She has limited range of motion but she has significant swelling and I feel this is likely secondary to pain.  Lower suspicion for tendon injury.  X-rays obtained.  X-rays independently reviewed by myself.  She has a intra-articular transverse fracture of the distal phalanx.  Will place in splint and have her follow-up with orthopedics.  Recommend ice and elevation.  Tylenol or ibuprofen as needed for pain.  After history, exam, and medical workup I feel the patient has been appropriately medically screened and is safe for discharge home. Pertinent diagnoses were discussed with the patient. Patient was given return precautions.  Final Clinical Impression(s) / ED Diagnoses Final diagnoses:  Closed nondisplaced fracture of distal phalanx of left thumb, initial encounter    Rx / DC Orders ED Discharge Orders     None        Deyra Perdomo, Mayer Masker, MD 12/24/20 [phone]

## 2020-12-25 ENCOUNTER — Encounter (INDEPENDENT_AMBULATORY_CARE_PROVIDER_SITE_OTHER): Payer: Self-pay

## 2020-12-25 DIAGNOSIS — S62522A Displaced fracture of distal phalanx of left thumb, initial encounter for closed fracture: Secondary | ICD-10-CM | POA: Diagnosis not present

## 2020-12-26 ENCOUNTER — Ambulatory Visit (INDEPENDENT_AMBULATORY_CARE_PROVIDER_SITE_OTHER): Payer: BC Managed Care – PPO | Admitting: Adult Health

## 2020-12-26 ENCOUNTER — Other Ambulatory Visit: Payer: Self-pay

## 2020-12-26 ENCOUNTER — Encounter (INDEPENDENT_AMBULATORY_CARE_PROVIDER_SITE_OTHER): Payer: Self-pay | Admitting: Adult Health

## 2020-12-26 VITALS — BP 127/84 | HR 65 | Temp 98.0°F | Ht 66.0 in | Wt 229.0 lb

## 2020-12-26 DIAGNOSIS — F3289 Other specified depressive episodes: Secondary | ICD-10-CM | POA: Diagnosis not present

## 2020-12-26 DIAGNOSIS — Z9189 Other specified personal risk factors, not elsewhere classified: Secondary | ICD-10-CM | POA: Diagnosis not present

## 2020-12-26 DIAGNOSIS — E1169 Type 2 diabetes mellitus with other specified complication: Secondary | ICD-10-CM | POA: Diagnosis not present

## 2020-12-26 DIAGNOSIS — E559 Vitamin D deficiency, unspecified: Secondary | ICD-10-CM

## 2020-12-26 DIAGNOSIS — Z6841 Body Mass Index (BMI) 40.0 and over, adult: Secondary | ICD-10-CM

## 2020-12-26 DIAGNOSIS — E66813 Obesity, class 3: Secondary | ICD-10-CM

## 2020-12-26 MED ORDER — BUPROPION HCL ER (SR) 150 MG PO TB12
150.0000 mg | ORAL_TABLET | Freq: Every day | ORAL | 0 refills | Status: DC
Start: 2020-12-26 — End: 2021-01-22

## 2020-12-26 MED ORDER — OZEMPIC (1 MG/DOSE) 2 MG/1.5ML ~~LOC~~ SOPN: 0.5000 mg | PEN_INJECTOR | SUBCUTANEOUS | 0 refills | Status: DC

## 2020-12-27 LAB — VITAMIN D 25 HYDROXY (VIT D DEFICIENCY, FRACTURES): Vit D, 25-Hydroxy: 52.5 ng/mL (ref 30.0–100.0)

## 2020-12-27 NOTE — Progress Notes (Signed)
Chief Complaint:   OBESITY Robin Glover is here to discuss her progress with her obesity treatment plan along with follow-up of her obesity related diagnoses. Robin Glover is on the Category 2 Plan and states she is following her eating plan approximately 0% of the time. Robin Glover states she is dancing for 120 minutes 4 times per week.  Today's visit was #: 24 Starting weight: 250 lbs Starting date: 02/09/2019 Today's weight: 229 lbs Today's date: 12/26/2020 Total lbs lost to date: 21 lbs Total lbs lost since last in-office visit: 0  Interim History:  Robin Glover's last in office appointment was on 08/13/2020 with [facility], NP-C. She has followed [facility] the last several months - only up 2 pounds.  Subjective:   1. Type 2 diabetes mellitus with other specified complication, without long-term current use of insulin (HCC) On 04/25/2020, A1c was 5.4 - at goal.  On 11/15/2020, A1c was 5.4. PCP refilled Ozempic 0.5 mg. She denies mass in neck, dysphagia, dyspepsia, persistent hoarseness, or GI upset.  2. Vitamin D deficiency On 04/25/2020, vitamin D level - 52.2 - stable.  3. Other depression with emotional eating  She ran out of bupropion SR 150 mg >3 weeks.  Endorses increased chocolate cravings since stopping bupropion.  4. At risk for osteoporosis Robin Glover is at higher risk of osteopenia and osteoporosis due to Vitamin D deficiency and obesity.   Assessment/Plan:   1. Type 2 diabetes mellitus with other specified complication, without long-term current use of insulin (HCC) Continue Ozempic 0.5 mg subcutaneously weekly - managed by PCP.  - Semaglutide, 1 MG/DOSE, (OZEMPIC, 1 MG/DOSE,) 2 MG/1.5ML SOPN; Inject 0.5 mg into the skin once a week.  Dispense: 9 mL; Refill: 0  2. Vitamin D deficiency Check labs.  Mychart patient after labs.  - VITAMIN D 25 Hydroxy (Vit-D Deficiency, Fractures)  3. Other depression with emotional eating  Restart bupropion SR 150 mg daily.  - Restart buPROPion  (WELLBUTRIN SR) 150 MG 12 hr tablet; Take 1 tablet (150 mg total) by mouth daily.  Dispense: 30 tablet; Refill: 0  4. At risk for osteoporosis Robin Glover was given approximately 15 minutes of osteoporosis prevention counseling today. Robin Glover is at risk for osteopenia and osteoporosis due to her Vitamin D deficiency. She was encouraged to take her Vitamin D and follow her higher calcium diet and increase strengthening exercise to help strengthen her bones and decrease her risk of osteopenia and osteoporosis.  Repetitive spaced learning was employed today to elicit superior memory formation and behavioral change.  5. Obesity: Current BMI 37.1  Robin Glover is currently in the action stage of change. As such, her goal is to continue with weight loss efforts. She has agreed to the Category 2 Plan.   Exercise goals:  As is.  Behavioral modification strategies: increasing lean protein intake, decreasing simple carbohydrates, meal planning and cooking strategies, keeping healthy foods in the home, and keeping a strict food journal.  Robin Glover has agreed to follow-up with our clinic in 4 weeks. She was informed of the importance of frequent follow-up visits to maximize her success with intensive lifestyle modifications for her multiple health conditions.   Objective:   Blood pressure 127/84, pulse 65, temperature 98 F (36.7 C), height 5\' 6"  (1.676 m), weight 229 lb (103.9 kg), SpO2 100 %. Body mass index is 36.96 kg/m.  General: Cooperative, alert, well developed, in no acute distress. HEENT: Conjunctivae and lids unremarkable. Cardiovascular: Regular rhythm.  Lungs: Normal work of breathing. Neurologic: No focal deficits.  Lab Results  Component Value Date   CREATININE 0.80 04/25/2020   BUN 7 04/25/2020   NA 139 04/25/2020   K 4.5 04/25/2020   CL 103 04/25/2020   CO2 22 04/25/2020   Lab Results  Component Value Date   ALT 23 04/25/2020   AST 19 04/25/2020   ALKPHOS 62 04/25/2020   BILITOT  0.3 04/25/2020   Lab Results  Component Value Date   HGBA1C 5.4 04/25/2020   HGBA1C 5.7 (H) 10/31/2019   HGBA1C 5.7 (H) 06/09/2019   HGBA1C 5.8 (H) 02/09/2019   HGBA1C 6.0 (H) 01/15/2015   Lab Results  Component Value Date   INSULIN 10.6 10/31/2019   INSULIN 13.7 06/09/2019   INSULIN 12.5 02/09/2019   Lab Results  Component Value Date   TSH 3.660 06/09/2019   Lab Results  Component Value Date   CHOL 166 04/25/2020   HDL 50 04/25/2020   LDLCALC 105 (H) 04/25/2020   TRIG 54 04/25/2020   CHOLHDL 4.4 01/15/2015   Lab Results  Component Value Date   VD25OH 52.5 12/26/2020   VD25OH 52.2 04/25/2020   VD25OH 39.6 10/31/2019   Lab Results  Component Value Date   WBC 6.5 06/09/2019   HGB 13.0 06/09/2019   HCT 39.2 06/09/2019   MCV 92 06/09/2019   PLT 287 06/09/2019   Lab Results  Component Value Date   IRON 88 06/09/2019   TIBC 351 06/09/2019   FERRITIN 73 06/09/2019   Attestation Statements:   Reviewed by clinician on day of visit: allergies, medications, problem list, medical history, surgical history, family history, social history, and previous encounter notes.  I, Insurance claims handler, CMA, am acting as Energy manager for William Hamburger, NP.  I have reviewed the above documentation for accuracy and completeness, and I agree with the above. -  Ameah Chanda d. Davine Sweney, NP-C

## 2021-01-02 DIAGNOSIS — S62522A Displaced fracture of distal phalanx of left thumb, initial encounter for closed fracture: Secondary | ICD-10-CM | POA: Diagnosis not present

## 2021-01-20 ENCOUNTER — Other Ambulatory Visit (INDEPENDENT_AMBULATORY_CARE_PROVIDER_SITE_OTHER): Payer: Self-pay | Admitting: Adult Health

## 2021-01-20 DIAGNOSIS — F3289 Other specified depressive episodes: Secondary | ICD-10-CM

## 2021-01-21 NOTE — Telephone Encounter (Signed)
LOV w/ [place]

## 2021-01-22 ENCOUNTER — Ambulatory Visit (INDEPENDENT_AMBULATORY_CARE_PROVIDER_SITE_OTHER): Payer: BC Managed Care – PPO | Admitting: Adult Health

## 2021-01-22 ENCOUNTER — Encounter (INDEPENDENT_AMBULATORY_CARE_PROVIDER_SITE_OTHER): Payer: Self-pay | Admitting: Adult Health

## 2021-01-22 ENCOUNTER — Other Ambulatory Visit: Payer: Self-pay

## 2021-01-22 VITALS — BP 130/84 | HR 95 | Temp 97.7°F | Ht 66.0 in | Wt 227.0 lb

## 2021-01-22 DIAGNOSIS — F3289 Other specified depressive episodes: Secondary | ICD-10-CM

## 2021-01-22 DIAGNOSIS — E559 Vitamin D deficiency, unspecified: Secondary | ICD-10-CM

## 2021-01-22 DIAGNOSIS — E1169 Type 2 diabetes mellitus with other specified complication: Secondary | ICD-10-CM

## 2021-01-22 DIAGNOSIS — E669 Obesity, unspecified: Secondary | ICD-10-CM | POA: Diagnosis not present

## 2021-01-22 DIAGNOSIS — Z6836 Body mass index (BMI) 36.0-36.9, adult: Secondary | ICD-10-CM

## 2021-01-22 DIAGNOSIS — Z9189 Other specified personal risk factors, not elsewhere classified: Secondary | ICD-10-CM

## 2021-01-22 DIAGNOSIS — Z6841 Body Mass Index (BMI) 40.0 and over, adult: Secondary | ICD-10-CM

## 2021-01-22 MED ORDER — VITAMIN D (ERGOCALCIFEROL) 1.25 MG (50000 UNIT) PO CAPS: 50000.0000 [IU] | ORAL_CAPSULE | ORAL | 0 refills | Status: DC

## 2021-01-22 MED ORDER — BUPROPION HCL ER (SR) 150 MG PO TB12: 150.0000 mg | ORAL_TABLET | Freq: Every day | ORAL | 0 refills | Status: DC

## 2021-01-22 MED ORDER — SEMAGLUTIDE (1 MG/DOSE) 4 MG/3ML ~~LOC~~ SOPN: 1.0000 mg | PEN_INJECTOR | SUBCUTANEOUS | 0 refills | Status: DC

## 2021-01-23 NOTE — Progress Notes (Signed)
Chief Complaint:   OBESITY Robin Glover is here to discuss her progress with her obesity treatment plan along with follow-up of her obesity related diagnoses. Robin Glover is on the Category 2 Plan and states she is following her eating plan approximately 20% of the time. Robin Glover states she is walking for 30 minutes 5 times per week.  Today's visit was #: 25 Starting weight: 250 lbs Starting date: 02/09/2019 Today's weight: 227 lbs Today's date: 01/22/2021 Total lbs lost to date: 23 lbs Total lbs lost since last in-office visit: 2 lbs  Interim History:  Over the holidays, she experienced depressed mood and poor appetite. On the flip side - the last 2 weeks she has been craving sweets. 2023 goals: Continue to improve with healthy eating habits.   Subjective:   1. Type 2 diabetes mellitus with other specified complication, without long-term current use of insulin (HCC) She is on Ozempic 0.5 - injects on weekends. She has been on this dose/strength for months - denies mass in neck, dysphagia, dyspepsia, persistent hoarseness or GI upset. When she was on Ozempic 1 mg - extreme constipation and intermittent symptoms of hypoglycemia.  She never checked BG at these times.  2. Vitamin D deficiency Discussed labs with patient today.  Vitamin D level on 12/26/2020 - 52.5 - stable. She is currently taking prescription ergocalciferol 50,000 IU each week. She denies nausea, vomiting or muscle weakness.  3. Other depression with emotional eating  BP/HR stable at office visit. She denies history of seizures.   She experienced increased depression symptoms over Christmas - those have since resolved. She denies SI/HI.  4. At risk for constipation Robin Glover is at increased risk for constipation due to increasing GLP-1RA therapy.  Assessment/Plan:   1. Type 2 diabetes mellitus with other specified complication, without long-term current use of insulin (HCC) Refill and increase Ozempic 1 mg once weekly,  dispense 9 mL, 0 refills.  2. Vitamin D deficiency Check labs every 2 months. Refill ergocalciferol 50,000 IU once weekly.  - Refill Vitamin D, Ergocalciferol, (DRISDOL) 1.25 MG (50000 UNIT) CAPS capsule; Take 1 capsule (50,000 Units total) by mouth every 7 (seven) days.  Dispense: 12 capsule; Refill: 0  3. Other depression with emotional eating  Refill bupropion SR 150 mg daily.  - Refill buPROPion (WELLBUTRIN SR) 150 MG 12 hr tablet; Take 1 tablet (150 mg total) by mouth daily.  Dispense: 30 tablet; Refill: 0  4. At risk for constipation Robin Glover was given approximately 15 minutes of counseling today regarding prevention of constipation. She was encouraged to increase water and fiber intake.   5. Obesity: Current BMI 36.7  Robin Glover is currently in the action stage of change. As such, her goal is to continue with weight loss efforts. She has agreed to the Category 2 Plan.   Exercise goals:  As is.  Behavioral modification strategies: increasing lean protein intake, decreasing simple carbohydrates, meal planning and cooking strategies, keeping healthy foods in the home, and planning for success.  Robin Glover has agreed to follow-up with our clinic in 4 weeks. She was informed of the importance of frequent follow-up visits to maximize her success with intensive lifestyle modifications for her multiple health conditions.   Objective:   Blood pressure 130/84, pulse 95, temperature 97.7 F (36.5 C), height 5\' 6"  (1.676 m), weight 227 lb (103 kg), SpO2 99 %. Body mass index is 36.64 kg/m.  General: Cooperative, alert, well developed, in no acute distress. HEENT: Conjunctivae and lids unremarkable. Cardiovascular: Regular  rhythm.  Lungs: Normal work of breathing. Neurologic: No focal deficits.   Lab Results  Component Value Date   CREATININE 0.80 04/25/2020   BUN 7 04/25/2020   NA 139 04/25/2020   K 4.5 04/25/2020   CL 103 04/25/2020   CO2 22 04/25/2020   Lab Results  Component  Value Date   ALT 23 04/25/2020   AST 19 04/25/2020   ALKPHOS 62 04/25/2020   BILITOT 0.3 04/25/2020   Lab Results  Component Value Date   HGBA1C 5.4 04/25/2020   HGBA1C 5.7 (H) 10/31/2019   HGBA1C 5.7 (H) 06/09/2019   HGBA1C 5.8 (H) 02/09/2019   HGBA1C 6.0 (H) 01/15/2015   Lab Results  Component Value Date   INSULIN 10.6 10/31/2019   INSULIN 13.7 06/09/2019   INSULIN 12.5 02/09/2019   Lab Results  Component Value Date   TSH 3.660 06/09/2019   Lab Results  Component Value Date   CHOL 166 04/25/2020   HDL 50 04/25/2020   LDLCALC 105 (H) 04/25/2020   TRIG 54 04/25/2020   CHOLHDL 4.4 01/15/2015   Lab Results  Component Value Date   VD25OH 52.5 12/26/2020   VD25OH 52.2 04/25/2020   VD25OH 39.6 10/31/2019   Lab Results  Component Value Date   WBC 6.5 06/09/2019   HGB 13.0 06/09/2019   HCT 39.2 06/09/2019   MCV 92 06/09/2019   PLT 287 06/09/2019   Lab Results  Component Value Date   IRON 88 06/09/2019   TIBC 351 06/09/2019   FERRITIN 73 06/09/2019   Attestation Statements:   Reviewed by clinician on day of visit: allergies, medications, problem list, medical history, surgical history, family history, social history, and previous encounter notes.  I, Insurance claims handler, CMA, am acting as Energy manager for William Hamburger, NP.  I have reviewed the above documentation for accuracy and completeness, and I agree with the above. -  Harjit Douds d. Janne Faulk, NP-C

## 2021-01-31 DIAGNOSIS — S62522A Displaced fracture of distal phalanx of left thumb, initial encounter for closed fracture: Secondary | ICD-10-CM | POA: Diagnosis not present

## 2021-02-18 ENCOUNTER — Ambulatory Visit (INDEPENDENT_AMBULATORY_CARE_PROVIDER_SITE_OTHER): Payer: BC Managed Care – PPO | Admitting: Adult Health

## 2021-02-21 ENCOUNTER — Ambulatory Visit (INDEPENDENT_AMBULATORY_CARE_PROVIDER_SITE_OTHER): Payer: BC Managed Care – PPO | Admitting: Adult Health

## 2021-02-21 ENCOUNTER — Other Ambulatory Visit: Payer: Self-pay

## 2021-02-21 ENCOUNTER — Encounter (INDEPENDENT_AMBULATORY_CARE_PROVIDER_SITE_OTHER): Payer: Self-pay | Admitting: Adult Health

## 2021-02-21 VITALS — BP 120/82 | HR 78 | Temp 98.5°F | Ht 66.0 in | Wt 230.0 lb

## 2021-02-21 DIAGNOSIS — Z6841 Body Mass Index (BMI) 40.0 and over, adult: Secondary | ICD-10-CM

## 2021-02-21 DIAGNOSIS — Z9189 Other specified personal risk factors, not elsewhere classified: Secondary | ICD-10-CM

## 2021-02-21 DIAGNOSIS — Z7985 Long-term (current) use of injectable non-insulin antidiabetic drugs: Secondary | ICD-10-CM

## 2021-02-21 DIAGNOSIS — K5909 Other constipation: Secondary | ICD-10-CM | POA: Diagnosis not present

## 2021-02-21 DIAGNOSIS — E1169 Type 2 diabetes mellitus with other specified complication: Secondary | ICD-10-CM | POA: Diagnosis not present

## 2021-02-21 MED ORDER — SEMAGLUTIDE (1 MG/DOSE) 4 MG/3ML ~~LOC~~ SOPN: 1.0000 mg | PEN_INJECTOR | SUBCUTANEOUS | 0 refills | Status: DC

## 2021-02-21 NOTE — Progress Notes (Signed)
Chief Complaint:   OBESITY Robin Glover is here to discuss her progress with her obesity treatment plan along with follow-up of her obesity related diagnoses. Robin Glover is on the Category 2 Plan and states she is following her eating plan approximately 60% of the time. Robin Glover states she is doing cardio/weights for 30-40 minutes 2 times per week.  Today's visit was #: 26 Starting weight: 250 lbs Starting date: 02/09/2019 Today's weight: 230 lbs Today's date: 02/21/2021 Total lbs lost to date: 20 lbs Total lbs lost since last in-office visit: 0  Interim History:  Robin Glover's Ozempic was increased from 0.5 mg to 1 mg at last office visit on 01/22/2021. Normal bowel habits - 1 BM daily or every other day.   Now every 2-3 days.  She estimates to drink 16 ounces of water per day.  Subjective:   1. Type 2 diabetes mellitus with other specified complication, without long-term current use of insulin (HCC) Robin Glover's Ozempic was increased from 0.5 mg to 1 mg at last office visit on 01/22/2021. She is not checking BG at home.  Denies symptoms of hypoglycemia. She denies mass in neck, dysphagia, dyspepsia, persistent hoarseness, abd pain, or nausea.  2. Other constipation Now every 2-3 days.  She estimates to drink 16 ounces of water per day. She denies abd pain or hematochezia.  3. At risk for nausea Robin Glover is at risk for nausea due to taking a GLP-1 for T2D.  Assessment/Plan:   1. Type 2 diabetes mellitus with other specified complication, without long-term current use of insulin (HCC) Refill Ozempic 1 mg once weekly.  - Refill Semaglutide, 1 MG/DOSE, 4 MG/3ML SOPN; Inject 1 mg as directed once a week.  Dispense: 9 mL; Refill: 0  2. Other constipation Increase water intake, OTC MiraLAX, continue regular exercise.  3. At risk for nausea Robin Glover was given approximately 15 minutes of nausea prevention counseling today. Robin Glover is at risk for nausea due to her new or current  medication. She was encouraged to titrate her medication slowly, make sure to stay hydrated, eat smaller portions throughout the day, and avoid high fat meals.   4. Obesity: Current BMI 37.2  Robin Glover is currently in the action stage of change. As such, her goal is to continue with weight loss efforts. She has agreed to the Category 2 Plan.   Exercise goals:  As is.  Behavioral modification strategies: increasing lean protein intake, decreasing simple carbohydrates, increasing water intake, meal planning and cooking strategies, keeping healthy foods in the home, and planning for success.  Robin Glover has agreed to follow-up with our clinic in 4 weeks. She was informed of the importance of frequent follow-up visits to maximize her success with intensive lifestyle modifications for her multiple health conditions.   Objective:   Blood pressure 120/82, pulse 78, temperature 98.5 F (36.9 C), height 5\' 6"  (1.676 m), weight 230 lb (104.3 kg), SpO2 99 %. Body mass index is 37.12 kg/m.  General: Cooperative, alert, well developed, in no acute distress. HEENT: Conjunctivae and lids unremarkable. Cardiovascular: Regular rhythm.  Lungs: Normal work of breathing. Neurologic: No focal deficits.   Lab Results  Component Value Date   CREATININE 0.80 04/25/2020   BUN 7 04/25/2020   NA 139 04/25/2020   K 4.5 04/25/2020   CL 103 04/25/2020   CO2 22 04/25/2020   Lab Results  Component Value Date   ALT 23 04/25/2020   AST 19 04/25/2020   ALKPHOS 62 04/25/2020   BILITOT  0.3 04/25/2020   Lab Results  Component Value Date   HGBA1C 5.4 04/25/2020   HGBA1C 5.7 (H) 10/31/2019   HGBA1C 5.7 (H) 06/09/2019   HGBA1C 5.8 (H) 02/09/2019   HGBA1C 6.0 (H) 01/15/2015   Lab Results  Component Value Date   INSULIN 10.6 10/31/2019   INSULIN 13.7 06/09/2019   INSULIN 12.5 02/09/2019   Lab Results  Component Value Date   TSH 3.660 06/09/2019   Lab Results  Component Value Date   CHOL 166 04/25/2020    HDL 50 04/25/2020   LDLCALC 105 (H) 04/25/2020   TRIG 54 04/25/2020   CHOLHDL 4.4 01/15/2015   Lab Results  Component Value Date   VD25OH 52.5 12/26/2020   VD25OH 52.2 04/25/2020   VD25OH 39.6 10/31/2019   Lab Results  Component Value Date   WBC 6.5 06/09/2019   HGB 13.0 06/09/2019   HCT 39.2 06/09/2019   MCV 92 06/09/2019   PLT 287 06/09/2019   Lab Results  Component Value Date   IRON 88 06/09/2019   TIBC 351 06/09/2019   FERRITIN 73 06/09/2019   Attestation Statements:   Reviewed by clinician on day of visit: allergies, medications, problem list, medical history, surgical history, family history, social history, and previous encounter notes.  I, Insurance claims handler, CMA, am acting as Energy manager for William Hamburger, NP.  I have reviewed the above documentation for accuracy and completeness, and I agree with the above. -  Odessa Morren d. Rafan Sanders, NP-C

## 2021-02-28 DIAGNOSIS — S62522A Displaced fracture of distal phalanx of left thumb, initial encounter for closed fracture: Secondary | ICD-10-CM | POA: Diagnosis not present

## 2021-03-04 ENCOUNTER — Ambulatory Visit (INDEPENDENT_AMBULATORY_CARE_PROVIDER_SITE_OTHER): Payer: BC Managed Care – PPO | Admitting: Professional

## 2021-03-04 ENCOUNTER — Encounter: Payer: Self-pay | Admitting: Professional

## 2021-03-04 DIAGNOSIS — F4323 Adjustment disorder with mixed anxiety and depressed mood: Secondary | ICD-10-CM | POA: Diagnosis not present

## 2021-03-04 NOTE — Progress Notes (Signed)
[facility] Counselor/Therapist Progress Note  Patient ID: Robin Glover, MRN: [id number],    Date: 03/04/2021  Time Spent: 44 minutes [phone] pm  Treatment Type: Individual Therapy  Reported Symptoms: depressed-no motivation, tired, isolative for past three months around her menses, a few weeks ago she had self-defeating thoughts and began questioning what it would mean if she is not here. Denies any current SI, denies having every considered a plan.  Mental Status Exam: Appearance:  Casual     Behavior: Appropriate and Sharing  Motor: Normal  Speech/Language:  Clear and Coherent and Normal Rate  Affect: Full Range  Mood: sad  Thought process: goal directed  Thought content:   WNL  Sensory/Perceptual disturbances:   WNL  Orientation: oriented to person, place, time/date, and situation  Attention: Good  Concentration: Good  Memory: WNL  Fund of knowledge:  Good  Insight:   Good  Judgment:  Good  Impulse Control: Good   Risk Assessment: Danger to Self:  No Self-injurious Behavior: No Danger to Others: No Duty to Warn:no Physical Aggression / Violence:No  Access to Firearms a concern: No  Gang Involvement:No   Subjective: This session was held via video teletherapy due to the coronavirus risk at this time. The patient consented to video teletherapy and was located at her home during this session. She is aware it is the responsibility of the patient to secure confidentiality on her end of the session. The provider was in a private home office for the duration of this session.   The patient arrived on time for her webex appointment appearing casualy groomed and easily engaged  Issues addressed: 1- mood -depressed, isolative, highly sensitive to criticism, tired, no motivation, self-defeating thoughts, increased irritability, for past couple of weeks has felt like she could cry everyday 2-professional -struggling with criticism -feels her opinions are  overlooked 3-self-care -joined the gym and felt the best she has in a long time 15-family -daughter Robin Glover recognizes the days the pt is off -setting boundaries with father   -she now gives him an allowance and he can spend as needed 5-health issues -bupropion 150 mg daily daly for months -was off meds for most of  Dec into January -pt questions the potential that she is in menopause  Treatment Plan Problems Addressed  Balancing Work and [employer], Caregiving of Aging Parents, Low Self-Esteem/Lack of Assertiveness, Single Parenting  Goals 1. Develop a balance between caregiving responsibilities and other roles, responsibilities, and interests. 2. Develop a realistic perspective regarding demands and obligations of multiple roles and their completion. 3. Develop a support system to assist in coping with stressors of single parenting. 4. Develop perception of parental caregiving as an opportunity for psychological and personal growth; acknowledge the benefits and the challenges. 5. Develop time management strategies to reduce role overload and role conflicts. 6. Implement self-care strategies to maintain physical and emotional health. Objective Delegate responsibilities in the home. Target Date: 2022-03-03 Frequency: Monthly  Progress: 0 Modality: individual  Objective Identify and implement stress management and self-care strategies. Target Date: 2022-03-03 Frequency: Monthly  Progress: 0 Modality: individual  Related Interventions Encourage the client to employ stress management and self-care activities (e.g., yoga, guided relaxation, exercise, massage, bubble baths). Objective Describe the challenges, demands, and stressors associated with single parenting. Target Date: 2022-03-03 Frequency: Monthly  Progress: 0 Modality: individual  Related Interventions Validate the client's experiences of frustration, despair, and anxiety associated with single parenting and  associated multiple roles. Objective Develop planning and problem-solving  skills to more effectively cope with multiple demands. Target Date: 2022-03-03 Frequency: Monthly  Progress: 0 Modality: individual  Related Interventions Role-play with the client planning and assertive problem-solving skills (e.g., planning and defining the needs for assistance; determining when help is needed, how to ask for help, and whom to ask). Objective Identify feelings and behavior related to attempting to balance work and family roles. Target Date: 2022-03-03 Frequency: Monthly  Progress: 0 Modality: individual  Related Interventions Explore and identify the client's feelings of guilt, shame, and anxiety associated with not meeting internal or external expectations associated with balancing work and family roles. 7. Increase ability to express needs and desires openly and honestly. 8. Increase assertiveness skills and ability to advocate for self. 9. Increase openness to experiences and opportunities associated with risk-taking. Objective Practice saying "no" to at least one person, declining to comply with a request/favor or identify and assert rights, needs, and wants. Target Date: 2022-03-03 Frequency: Monthly  Progress: 0 Modality: individual  Objective Identify at least three positive personal attributes. Target Date: 2022-03-03 Frequency: Monthly  Progress: 0 Modality: individual  Related Interventions Explore the client's fears of being assertive (e.g., fear of rejection, fear of ineffectiveness, fear of ridicule). Explore areas of competency with the client and encourage her to engage in related activities (e.g., dance, singing, arts, volunteer work, joining a book club). Objective Identify three roadblocks to improved self-esteem. Target Date: 2022-03-03 Frequency: Monthly  Progress: 0 Modality: individual    Diagnosis:Adjustment disorder with mixed anxiety and depressed mood  Plan:   -being consistent with gym and getting out with a friend -meet again on Thursday, March 28, 2021 at 8am.  Teofilo Pod, [facility]

## 2021-03-20 ENCOUNTER — Ambulatory Visit (INDEPENDENT_AMBULATORY_CARE_PROVIDER_SITE_OTHER): Payer: BC Managed Care – PPO | Admitting: Adult Health

## 2021-03-20 ENCOUNTER — Other Ambulatory Visit: Payer: Self-pay

## 2021-03-20 ENCOUNTER — Encounter (INDEPENDENT_AMBULATORY_CARE_PROVIDER_SITE_OTHER): Payer: Self-pay | Admitting: Adult Health

## 2021-03-20 VITALS — BP 112/78 | HR 82 | Temp 98.1°F | Ht 66.0 in | Wt 231.0 lb

## 2021-03-20 DIAGNOSIS — Z7985 Long-term (current) use of injectable non-insulin antidiabetic drugs: Secondary | ICD-10-CM

## 2021-03-20 DIAGNOSIS — Z9189 Other specified personal risk factors, not elsewhere classified: Secondary | ICD-10-CM

## 2021-03-20 DIAGNOSIS — Z6841 Body Mass Index (BMI) 40.0 and over, adult: Secondary | ICD-10-CM

## 2021-03-20 DIAGNOSIS — E669 Obesity, unspecified: Secondary | ICD-10-CM | POA: Diagnosis not present

## 2021-03-20 DIAGNOSIS — Z6837 Body mass index (BMI) 37.0-37.9, adult: Secondary | ICD-10-CM

## 2021-03-20 DIAGNOSIS — E559 Vitamin D deficiency, unspecified: Secondary | ICD-10-CM

## 2021-03-20 DIAGNOSIS — E1169 Type 2 diabetes mellitus with other specified complication: Secondary | ICD-10-CM

## 2021-03-20 MED ORDER — VITAMIN D (ERGOCALCIFEROL) 1.25 MG (50000 UNIT) PO CAPS: 50000.0000 [IU] | ORAL_CAPSULE | ORAL | 0 refills | Status: DC

## 2021-03-20 MED ORDER — SEMAGLUTIDE (2 MG/DOSE) 8 MG/3ML ~~LOC~~ SOPN: 2.0000 mg | PEN_INJECTOR | SUBCUTANEOUS | 0 refills | Status: DC

## 2021-03-21 NOTE — Progress Notes (Signed)
? ? ? ?Chief Complaint:  ? ?OBESITY ?Robin Glover is here to discuss her progress with her obesity treatment plan along with follow-up of her obesity related diagnoses. Alejandra is on the Category 2 Plan and states she is following her eating plan approximately 80% of the time. Emanuelle states she is doing cardio/weights for 60 minutes 3 times per week. ? ?Today's visit was #: 27 ?Starting weight: 250 lbs ?Starting date: 02/09/2019 ?Today's weight: 231 lbs ?Today's date: 03/20/2021 ?Total lbs lost to date: 19 lbs ?Total lbs lost since last in-office visit: 0 ? ?Interim History:  ?Chariti has been on Ozempic 1 mg since 01/22/2021. ?Lent:  No sweets.  No soda/tea.  No ETOH.  No fast food. 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 ? ?Subjective:  ? ?1. Vitamin D deficiency ?On 12/26/2020, vitamin D level - 52.5 - stable. ?She is currently taking prescription ergocalciferol 50,000 IU each week. She denies nausea, vomiting or muscle weakness. ? ?2. Type 2 diabetes mellitus with other specified complication, without long-term current use of insulin (HCC) ?Prescilla's Ozempic was increased from 0.5 mg to 1 mg  on 01/22/2021. ?She is not checking BG at home.  Denies symptoms of hypoglycemia. ?She denies mass in neck, dysphagia, dyspepsia, persistent hoarseness, abd pain, or nausea. ?  ? ?3. At risk for nausea ?Kasiah is at risk for nausea due to increasing Ozempic. ? ?Assessment/Plan:  ? ?1. Vitamin D deficiency ?Refill ergocalciferol 50,000 IU once weekly. ?Check labs at next office visit. ? ?- Refill Vitamin D, Ergocalciferol, (DRISDOL) 1.25 MG (50000 UNIT) CAPS capsule; Take 1 capsule (50,000 Units total) by mouth every 7 (seven) days.  Dispense: 12 capsule; Refill: 0 ? ?2. Type 2 diabetes mellitus with other specified complication, without long-term current use of insulin (HCC) ?Refill and increase Ozempic to 2 mg once weekly. ?Check labs at next office visit. ? ?-  Increase Semaglutide, 2 MG/DOSE, 8 MG/3ML SOPN; Inject 2 mg as directed once a week.  Dispense: 3 mL; Refill: 0 ? ?3. At risk for nausea ?Linna Darner Johnson-Zodi was given approximately 15 minutes of nausea prevention counseling today. [place] is at risk for nausea due to her new or current medication. She was encouraged to titrate her medication slowly, make sure to stay hydrated, eat smaller portions throughout the day, and avoid high fat meals.  ? ?4. Obesity: Current BMI 37.3 ? ?Athaliah is currently in the action stage of change. As such, her goal is to continue with weight loss efforts. She has agreed to the Category 2 Plan.  ? ?Add a cup of kale with meals. ? ?Check fasting labs at next office visit. ? ?Exercise goals:  As is. ? ?Behavioral modification strategies: increasing lean protein intake, decreasing simple carbohydrates, meal planning and cooking strategies, keeping healthy foods in the home, and planning for success. ? ?Karie has agreed to follow-up with our clinic in 4 weeks, fasting. She was informed of the importance of frequent follow-up visits to maximize her success with intensive lifestyle modifications for her multiple health conditions.  ? ?Objective:  ? ?Blood pressure 112/78, pulse 82, temperature 98.1 ?F (36.7 ?C), height 5\' 6"  (1.676 m), weight 231 lb (104.8 kg), SpO2 99 %. ?Body mass index is 37.28 kg/m?. ? ?General: Cooperative, alert, well developed, in no acute distress. ?HEENT: Conjunctivae and lids unremarkable. ?Cardiovascular: Regular rhythm.  ?Lungs: Normal work of breathing. ?Neurologic: No focal deficits.  ? ?Lab Results  ?  Component Value Date  ? CREATININE 0.80 04/25/2020  ? BUN 7 04/25/2020  ? NA 139 04/25/2020  ? K 4.5 04/25/2020  ? CL 103 04/25/2020  ? CO2 22 04/25/2020  ? ?Lab Results  ?Component Value Date  ? ALT 23 04/25/2020  ? AST 19 04/25/2020  ? ALKPHOS 62 04/25/2020  ? BILITOT 0.3 04/25/2020  ? ?Lab Results  ?Component Value Date  ? HGBA1C 5.4 04/25/2020  ? HGBA1C 5.7  (H) 10/31/2019  ? HGBA1C 5.7 (H) 06/09/2019  ? HGBA1C 5.8 (H) 02/09/2019  ? HGBA1C 6.0 (H) 01/15/2015  ? ?Lab Results  ?Component Value Date  ? INSULIN 10.6 10/31/2019  ? INSULIN 13.7 06/09/2019  ? INSULIN 12.5 02/09/2019  ? ?Lab Results  ?Component Value Date  ? TSH 3.660 06/09/2019  ? ?Lab Results  ?Component Value Date  ? CHOL 166 04/25/2020  ? HDL 50 04/25/2020  ? LDLCALC 105 (H) 04/25/2020  ? TRIG 54 04/25/2020  ? CHOLHDL 4.4 01/15/2015  ? ?Lab Results  ?Component Value Date  ? VD25OH 52.5 12/26/2020  ? VD25OH 52.2 04/25/2020  ? VD25OH 39.6 10/31/2019  ? ?Lab Results  ?Component Value Date  ? WBC 6.5 06/09/2019  ? HGB 13.0 06/09/2019  ? HCT 39.2 06/09/2019  ? MCV 92 06/09/2019  ? PLT 287 06/09/2019  ? ?Lab Results  ?Component Value Date  ? IRON 88 06/09/2019  ? TIBC 351 06/09/2019  ? FERRITIN 73 06/09/2019  ? ?Attestation Statements:  ? ?Reviewed by clinician on day of visit: allergies, medications, problem list, medical history, surgical history, family history, social history, and previous encounter notes. ? ?I, Insurance claims handler, CMA, am acting as Energy manager for William Hamburger, NP. ? ?I have reviewed the above documentation for accuracy and completeness, and I agree with the above. -  Kristoff Coonradt d. Jelitza Manninen, NP-C ? ?

## 2021-03-28 ENCOUNTER — Encounter: Payer: Self-pay | Admitting: Professional

## 2021-03-28 ENCOUNTER — Ambulatory Visit (INDEPENDENT_AMBULATORY_CARE_PROVIDER_SITE_OTHER): Payer: BC Managed Care – PPO | Admitting: Professional

## 2021-03-28 DIAGNOSIS — F4323 Adjustment disorder with mixed anxiety and depressed mood: Secondary | ICD-10-CM | POA: Diagnosis not present

## 2021-03-28 NOTE — Progress Notes (Signed)
[facility] Counselor/Therapist Progress Note ? ?Patient ID: Robin Glover, MRN: [id number],   ? ?Date: 03/28/2021 ? ?Time Spent: 44 minutes 801-845 am ? ?Treatment Type: Individual Therapy ? ?Risk Assessment: ?Danger to Self:  No ?Self-injurious Behavior: No ?Danger to Others: No ? ?Subjective: This session was held via video teletherapy due to the coronavirus risk at this time. The patient consented to video teletherapy and was located at her home during this session. She is aware it is the responsibility of the patient to secure confidentiality on her end of the session. The provider was in a private home office for the duration of this session.  ? ?The patient arrived late for her webex appointment appearing casualy groomed and easily engaged ? ?Issues addressed: ?1- homework-being consistent with gym and getting out with a friend ?-pt reports she continues to go to the gym ?2-mood ?-feels good ?-no depression ?-has decided to no longer take medication and has been off medication for about ten days ?-pt felt the medication caused greater emotional outbursts ?-she has decided to work from a holistic approach ?3-tired vs. fatigue ?-initially fatigue but relates it to cycle ?-after discussing difference between fatigue and tired ?  -she thinks she is tired ?-discussed sleep hygiene and importance of a consistent schedule ?4-professional ?-struggling with criticism ?-feels her opinions are overlooked ?5-self-care ?-joined the gym and felt the best she has in a long time ?-pt has done a lot more meditation and prayer ?  -working ?6-professional ?-short staffed ?-working long hours ?-pt will cover for her supervisor ?-time off ?  -pt discussed different set expectations for her vs. other coworker ?  -pt thinks supervisor needs her ?-supervisor young and inexperienced ?  -work has been rocky over the past several weeks ?-pt addressed need to attend personal appts for self and father ?-pt  considering if this is a LT or ST position ?-manpower and equipment issues have causes backlogs ?-pt likes her job, it is the irritation she feels from management's expectations ?-pt believes her department could be more productive with correct training ?  -there is no time for the department to get trained ?-pt admits that she is not putting herself first and is over-extending herself ?  -noticing that it occurs across all relationships ?  -pt admits that she can't be the saviour for everyone ?  -pt does not want praise she wants to stop into health ?7-family ?-brother has had addiction battles and history of jail time ?-brother currently in jail for DV with wife ?-he is calling, giving out her phone number and she is not responding ?  -in past she has always been his go-to ?  -pt has decided to discontinue enabling his behaviors ?  -pt is not giving him money and getting involved in his business ?-relationship with father is same back and forth thing ?  -she feels pushed to do more to get resources for him ?  -transportation to MD appointments ? ?Treatment Plan ?Problems Addressed  ?Balancing Work and [employer], Caregiving of Aging Parents, Low Self-Esteem/Lack of Assertiveness, Single Parenting  ?Goals ?1. Develop a balance between caregiving responsibilities and other roles, responsibilities, and interests. ?2. Develop a realistic perspective regarding demands and obligations of multiple roles and their completion. ?3. Develop a support system to assist in coping with stressors of single parenting. ?4. Develop perception of parental caregiving as an opportunity for psychological and personal growth; acknowledge the benefits and the challenges. ?5. Develop time management strategies  to reduce role overload and role conflicts. ?6. Implement self-care strategies to maintain physical and emotional health. ?Objective ?Delegate responsibilities in the home. ?Target Date: 2022-03-03 Frequency: Monthly   ?Progress: 0 Modality: individual  ?Objective ?Identify and implement stress management and self-care strategies. ?Target Date: 2022-03-03 Frequency: Monthly  ?Progress: 0 Modality: individual  ?Related Interventions ?Encourage the client to employ stress management and self-care activities (e.g., yoga, guided relaxation, exercise, massage, bubble baths). ?Objective ?Describe the challenges, demands, and stressors associated with single parenting. ?Target Date: 2022-03-03 Frequency: Monthly  ?Progress: 0 Modality: individual  ?Related Interventions ?Validate the client's experiences of frustration, despair, and anxiety associated with single parenting and associated multiple roles. ?Objective ?Develop planning and problem-solving skills to more effectively cope with multiple demands. ?Target Date: 2022-03-03 Frequency: Monthly  ?Progress: 0 Modality: individual  ?Related Interventions ?Role-play with the client planning and assertive problem-solving skills (e.g., planning and defining the needs for assistance; determining when help is needed, how to ask for help, and whom to ask). ?Objective ?Identify feelings and behavior related to attempting to balance work and family roles. ?Target Date: 2022-03-03 Frequency: Monthly  ?Progress: 0 Modality: individual  ?Related Interventions ?Explore and identify the client's feelings of guilt, shame, and anxiety associated with not meeting internal or external expectations associated with balancing work and family roles. ?7. Increase ability to express needs and desires openly and honestly. ?8. Increase assertiveness skills and ability to advocate for self. ?9. Increase openness to experiences and opportunities associated with risk-taking. ?Objective ?Practice saying "no" to at least one person, declining to comply with a request/favor or identify and assert rights, needs, and wants. ?Target Date: 2022-03-03 Frequency: Monthly  ?Progress: 0 Modality: individual   ?Objective ?Identify at least three positive personal attributes. ?Target Date: 2022-03-03 Frequency: Monthly  ?Progress: 0 Modality: individual  ?Related Interventions ?Explore the client's fears of being assertive (e.g., fear of rejection, fear of ineffectiveness, fear of ridicule). ?Explore areas of competency with the client and encourage her to engage in related activities (e.g., dance, singing, arts, volunteer work, joining a book club). ?Objective ?Identify three roadblocks to improved self-esteem. ?Target Date: 2022-03-03 Frequency: Monthly  ?Progress: 0 Modality: individual  ? ? ?Diagnosis:Adjustment disorder with mixed anxiety and depressed mood ? ?Plan:  ?-being more consistent with balance and boundaries. ?-meet again on Friday, May 10, 2021 at 11am. ? ?Teofilo Pod, [facility] ?

## 2021-04-02 DIAGNOSIS — S161XXA Strain of muscle, fascia and tendon at neck level, initial encounter: Secondary | ICD-10-CM | POA: Diagnosis not present

## 2021-04-15 ENCOUNTER — Other Ambulatory Visit (INDEPENDENT_AMBULATORY_CARE_PROVIDER_SITE_OTHER): Payer: Self-pay | Admitting: Adult Health

## 2021-04-15 DIAGNOSIS — E559 Vitamin D deficiency, unspecified: Secondary | ICD-10-CM

## 2021-04-16 ENCOUNTER — Other Ambulatory Visit (INDEPENDENT_AMBULATORY_CARE_PROVIDER_SITE_OTHER): Payer: Self-pay | Admitting: Adult Health

## 2021-04-16 DIAGNOSIS — E1169 Type 2 diabetes mellitus with other specified complication: Secondary | ICD-10-CM

## 2021-04-18 ENCOUNTER — Encounter (INDEPENDENT_AMBULATORY_CARE_PROVIDER_SITE_OTHER): Payer: Self-pay | Admitting: Adult Health

## 2021-04-18 ENCOUNTER — Ambulatory Visit (INDEPENDENT_AMBULATORY_CARE_PROVIDER_SITE_OTHER): Payer: BC Managed Care – PPO | Admitting: Adult Health

## 2021-04-18 VITALS — BP 122/81 | HR 71 | Temp 98.0°F | Ht 66.0 in | Wt 234.0 lb

## 2021-04-18 DIAGNOSIS — N921 Excessive and frequent menstruation with irregular cycle: Secondary | ICD-10-CM

## 2021-04-18 DIAGNOSIS — E1169 Type 2 diabetes mellitus with other specified complication: Secondary | ICD-10-CM | POA: Diagnosis not present

## 2021-04-18 DIAGNOSIS — E669 Obesity, unspecified: Secondary | ICD-10-CM

## 2021-04-18 DIAGNOSIS — E559 Vitamin D deficiency, unspecified: Secondary | ICD-10-CM | POA: Diagnosis not present

## 2021-04-18 DIAGNOSIS — E785 Hyperlipidemia, unspecified: Secondary | ICD-10-CM

## 2021-04-18 DIAGNOSIS — E66813 Obesity, class 3: Secondary | ICD-10-CM

## 2021-04-18 DIAGNOSIS — Z6837 Body mass index (BMI) 37.0-37.9, adult: Secondary | ICD-10-CM

## 2021-04-18 DIAGNOSIS — Z7985 Long-term (current) use of injectable non-insulin antidiabetic drugs: Secondary | ICD-10-CM

## 2021-04-18 DIAGNOSIS — Z9189 Other specified personal risk factors, not elsewhere classified: Secondary | ICD-10-CM

## 2021-04-18 DIAGNOSIS — F3289 Other specified depressive episodes: Secondary | ICD-10-CM

## 2021-04-19 LAB — CBC WITH DIFFERENTIAL/PLATELET
Basophils Absolute: 0 10*3/uL (ref 0.0–0.2)
Basos: 1 %
EOS (ABSOLUTE): 0.2 10*3/uL (ref 0.0–0.4)
Eos: 3 %
Hematocrit: 39.6 % (ref 34.0–46.6)
Hemoglobin: 12.9 g/dL (ref 11.1–15.9)
Immature Grans (Abs): 0 10*3/uL (ref 0.0–0.1)
Immature Granulocytes: 0 %
Lymphocytes Absolute: 1.9 10*3/uL (ref 0.7–3.1)
Lymphs: 29 %
MCH: 29.8 pg (ref 26.6–33.0)
MCHC: 32.6 g/dL (ref 31.5–35.7)
MCV: 92 fL (ref 79–97)
Monocytes Absolute: 0.6 10*3/uL (ref 0.1–0.9)
Monocytes: 9 %
Neutrophils Absolute: 3.8 10*3/uL (ref 1.4–7.0)
Neutrophils: 58 %
Platelets: 306 10*3/uL (ref 150–450)
RBC: 4.33 x10E6/uL (ref 3.77–5.28)
RDW: 12.5 % (ref 11.7–15.4)
WBC: 6.5 10*3/uL (ref 3.4–10.8)

## 2021-04-19 LAB — COMPREHENSIVE METABOLIC PANEL
ALT: 15 IU/L (ref 0–32)
AST: 13 IU/L (ref 0–40)
Albumin/Globulin Ratio: 1.8 (ref 1.2–2.2)
Albumin: 4.3 g/dL (ref 3.8–4.8)
Alkaline Phosphatase: 67 IU/L (ref 44–121)
BUN/Creatinine Ratio: 13 (ref 9–23)
BUN: 10 mg/dL (ref 6–24)
Bilirubin Total: 0.5 mg/dL (ref 0.0–1.2)
CO2: 24 mmol/L (ref 20–29)
Calcium: 9.8 mg/dL (ref 8.7–10.2)
Chloride: 106 mmol/L (ref 96–106)
Creatinine, Ser: 0.75 mg/dL (ref 0.57–1.00)
Globulin, Total: 2.4 g/dL (ref 1.5–4.5)
Glucose: 82 mg/dL (ref 70–99)
Potassium: 5 mmol/L (ref 3.5–5.2)
Sodium: 145 mmol/L — ABNORMAL HIGH (ref 134–144)
Total Protein: 6.7 g/dL (ref 6.0–8.5)
eGFR: 101 mL/min/{1.73_m2} (ref >59)

## 2021-04-19 LAB — VITAMIN D 25 HYDROXY (VIT D DEFICIENCY, FRACTURES): Vit D, 25-Hydroxy: 61.1 ng/mL (ref 30.0–100.0)

## 2021-04-19 LAB — LIPID PANEL
Chol/HDL Ratio: 3 ratio (ref 0.0–4.4)
Cholesterol, Total: 133 mg/dL (ref 100–199)
HDL: 45 mg/dL (ref >39)
LDL Chol Calc (NIH): 77 mg/dL (ref 0–99)
Triglycerides: 49 mg/dL (ref 0–149)
VLDL Cholesterol Cal: 11 mg/dL (ref 5–40)

## 2021-04-19 LAB — INSULIN, RANDOM: INSULIN: 10.3 u[IU]/mL (ref 2.6–24.9)

## 2021-04-19 LAB — HEMOGLOBIN A1C
Est. average glucose Bld gHb Est-mCnc: 111 mg/dL
Hgb A1c MFr Bld: 5.5 % (ref 4.8–5.6)

## 2021-04-25 NOTE — Progress Notes (Signed)
? ? ? ?Chief Complaint:  ? ?OBESITY ?Robin Glover is here to discuss her progress with her obesity treatment plan along with follow-up of her obesity related diagnoses. Robin Glover is on the Category 2 Plan and states she is following her eating plan approximately 70% of the time. Robin Glover states she is doing cardio and weights for 60 minutes 1 time per week. ? ?Today's visit was #: 28 ?Starting weight: 250 lbs ?Starting date: 02/09/2019 ?Today's weight: 234 lbs ?Today's date: 04/18/2021 ?Total lbs lost to date: 16 ?Total lbs lost since last in-office visit: 0 ? ?Interim History:  ?Robin Glover reports Jan/Feb 2023 she has had an increase in frequency of menses. Previously menses 27-28 days, but now menses every 14-17 days. Her sister experienced similar symptoms in her mid 70's.  ? ?Of note: her father has CKD. ? ?Subjective:  ? ?1. Type 2 diabetes mellitus with other specified complication, without long-term current use of insulin (HCC) ?Robin Glover remained on Ozempic 1 mg due to supply at home. She injects Ozempic 1 mg on Wednesdays.  ? ?2. Vitamin D deficiency ?Robin Glover is on Ergo, and she denies nausea, vomiting, or muscle weakness.  ? ?3. Menorrhagia with irregular cycle ?Robin Glover reports Jan/Feb 2023 she has had an increase in frequency of menses. Previously menses 27-28 days, but now menses every 14-17 days. Her sister experienced similar symptoms in her mid 42's.  ? ?4. Hyperlipidemia associated with type 2 diabetes mellitus (HCC) ?Robin Glover is on atorvastatin 20 mg daily.  ? ?5. Other depression with emotional eating  ?Robin Glover stopped bupropion SR 150 mg >4 weeks ago. Her mood is stable.  ? ?6. At risk for heart disease ?Robin Glover is at higher than average risk for cardiovascular disease due to obesity, hyperlipidemia, diabetes mellitus, hypertension. ? ?Assessment/Plan:  ? ?1. Type 2 diabetes mellitus with other specified complication, without long-term current use of insulin (HCC) ?We will check labs today. Robin Glover will complete 1 mg of  Ozempic supply then increased Ozempic to 2 mg.  ? ?- Comprehensive metabolic panel ?- Hemoglobin A1c ?- Insulin, random ? ?2. Vitamin D deficiency ?We will check labs today. Robin Glover will continue Ergo. ? ?- VITAMIN D 25 Hydroxy (Vit-D Deficiency, Fractures) ? ?3. Menorrhagia with irregular cycle ?F/u with GYN ?- CBC w/Diff/Platelet ? ?4. Hyperlipidemia associated with type 2 diabetes mellitus (HCC) ?We will check labs today. Robin Glover will continue atorvastatin 20 mg daily. ? ?- Lipid panel ? ?5. Other depression with emotional eating  ?Robin Glover is to remain off bupropion.  ? ?6. At risk for heart disease ?Robin Glover was given approximately 15 minutes of coronary artery disease prevention counseling today. She is 46 y.o. female and has risk factors for heart disease including obesity. We discussed intensive lifestyle modifications today with an emphasis on specific weight loss instructions and strategies. ? ?Repetitive spaced learning was employed today to elicit superior memory formation and behavioral change.  ? ?7. Obesity: Current BMI 37.8 ?Robin Glover is currently in the action stage of change. As such, her goal is to continue with weight loss efforts. She has agreed to following a lower carbohydrate, vegetable and lean protein rich diet plan.  ? ?Exercise goals: As is. ? ?Behavioral modification strategies: increasing lean protein intake, decreasing simple carbohydrates, keeping healthy foods in the home, ways to avoid boredom eating, and planning for success. ? ?Robin Glover has agreed to follow-up with our clinic in 4 weeks. She was informed of the importance of frequent follow-up visits to maximize her success with intensive lifestyle modifications for her  multiple health conditions.  ? ?Objective:  ? ?Blood pressure 122/81, pulse 71, temperature 98 ?F (36.7 ?C), height 5\' 6"  (1.676 m), weight 234 lb (106.1 kg), SpO2 100 %. ?Body mass index is 37.77 kg/m?. ? ?General: Cooperative, alert, well developed, in no acute  distress. ?HEENT: Conjunctivae and lids unremarkable. ?Cardiovascular: Regular rhythm.  ?Lungs: Normal work of breathing. ?Neurologic: No focal deficits.  ? ?Lab Results  ?Component Value Date  ? CREATININE 0.75 04/18/2021  ? BUN 10 04/18/2021  ? NA 145 (H) 04/18/2021  ? K 5.0 04/18/2021  ? CL 106 04/18/2021  ? CO2 24 04/18/2021  ? ?Lab Results  ?Component Value Date  ? ALT 15 04/18/2021  ? AST 13 04/18/2021  ? ALKPHOS 67 04/18/2021  ? BILITOT 0.5 04/18/2021  ? ?Lab Results  ?Component Value Date  ? HGBA1C 5.5 04/18/2021  ? HGBA1C 5.4 04/25/2020  ? HGBA1C 5.7 (H) 10/31/2019  ? HGBA1C 5.7 (H) 06/09/2019  ? HGBA1C 5.8 (H) 02/09/2019  ? ?Lab Results  ?Component Value Date  ? INSULIN 10.3 04/18/2021  ? INSULIN 10.6 10/31/2019  ? INSULIN 13.7 06/09/2019  ? INSULIN 12.5 02/09/2019  ? ?Lab Results  ?Component Value Date  ? TSH 3.660 06/09/2019  ? ?Lab Results  ?Component Value Date  ? CHOL 133 04/18/2021  ? HDL 45 04/18/2021  ? LDLCALC 77 04/18/2021  ? TRIG 49 04/18/2021  ? CHOLHDL 3.0 04/18/2021  ? ?Lab Results  ?Component Value Date  ? VD25OH 61.1 04/18/2021  ? VD25OH 52.5 12/26/2020  ? VD25OH 52.2 04/25/2020  ? ?Lab Results  ?Component Value Date  ? WBC 6.5 04/18/2021  ? HGB 12.9 04/18/2021  ? HCT 39.6 04/18/2021  ? MCV 92 04/18/2021  ? PLT 306 04/18/2021  ? ?Lab Results  ?Component Value Date  ? IRON 88 06/09/2019  ? TIBC 351 06/09/2019  ? FERRITIN 73 06/09/2019  ? ?Attestation Statements:  ? ?Reviewed by clinician on day of visit: allergies, medications, problem list, medical history, surgical history, family history, social history, and previous encounter notes. ? ? ?I, 06/11/2019, am acting as transcriptionist for Burt Knack, NP. ? ?I have reviewed the above documentation for accuracy and completeness, and I agree with the above. - Torrance Frech d. Earmon Sherrow, NP-C ? ?

## 2021-05-06 ENCOUNTER — Ambulatory Visit (INDEPENDENT_AMBULATORY_CARE_PROVIDER_SITE_OTHER): Payer: BC Managed Care – PPO | Admitting: Nurse Practitioner

## 2021-05-06 ENCOUNTER — Other Ambulatory Visit (HOSPITAL_COMMUNITY)
Admission: RE | Admit: 2021-05-06 | Discharge: 2021-05-06 | Disposition: A | Discharge status: Home / Self Care | Payer: BC Managed Care – PPO | Source: Ambulatory Visit | Attending: Nurse Practitioner | Admitting: Nurse Practitioner

## 2021-05-06 ENCOUNTER — Encounter: Payer: Self-pay | Admitting: Nurse Practitioner

## 2021-05-06 VITALS — BP 124/80 | Ht 65.5 in | Wt 236.0 lb

## 2021-05-06 DIAGNOSIS — Z01419 Encounter for gynecological examination (general) (routine) without abnormal findings: Secondary | ICD-10-CM | POA: Diagnosis not present

## 2021-05-06 DIAGNOSIS — N920 Excessive and frequent menstruation with regular cycle: Secondary | ICD-10-CM | POA: Diagnosis not present

## 2021-05-06 DIAGNOSIS — Z3041 Encounter for surveillance of contraceptive pills: Secondary | ICD-10-CM

## 2021-05-06 DIAGNOSIS — B9689 Other specified bacterial agents as the cause of diseases classified elsewhere: Secondary | ICD-10-CM

## 2021-05-06 DIAGNOSIS — N760 Acute vaginitis: Secondary | ICD-10-CM

## 2021-05-06 DIAGNOSIS — N898 Other specified noninflammatory disorders of vagina: Secondary | ICD-10-CM

## 2021-05-06 DIAGNOSIS — Z113 Encounter for screening for infections with a predominantly sexual mode of transmission: Secondary | ICD-10-CM

## 2021-05-06 LAB — WET PREP FOR TRICH, YEAST, CLUE

## 2021-05-06 MED ORDER — METRONIDAZOLE 500 MG PO TABS: 500.0000 mg | ORAL_TABLET | Freq: Two times a day (BID) | ORAL | 0 refills | Status: DC

## 2021-05-06 MED ORDER — NORETHINDRONE 0.35 MG PO TABS: 1.0000 | ORAL_TABLET | Freq: Every day | ORAL | 3 refills | Status: DC

## 2021-05-06 NOTE — Progress Notes (Signed)
? ?Robin Glover 1975/05/19 [id number] ? ? ?History:  46 y.o. G1P1001 presents for annual exam. Cycles are occurring more often, about every 2-3 weeks. Moderate to light bleeding. She stopped Micronor to see if it got better but no changes. Sister began having frequent menses at this age as well. Labs done 4/6 with PCP, TSH not included, no anemia. She denies menopausal symptoms. 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 Normal mammogram history. DM, HTN, HLD, vitamin D deficiency managed by PCP.  ? ?Gynecologic History ?Patient's last menstrual period was 05/01/2021. ?Period Cycle (Days): 21 (Every 2-3 weeks) ?Period Duration (Days): 4 ?Period Pattern: (!) Irregular ?Menstrual Flow: Moderate, Light ?Dysmenorrhea: (!) Mild ?Dysmenorrhea Symptoms: Cramping ?Contraception/Family planning: oral progesterone-only contraceptive ?Sexually active: Yes, most recent in February ? ?Health Maintenance ?Last Pap: 04/20/2018. Results were: Normal ?Last mammogram: 10/24/2020. Results were: Normal ?Last colonoscopy: Not indicated ?Last Dexa: Not indicated ? ?Past medical history, past surgical history, family history and social history were all reviewed and documented in the EPIC chart. 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 Mother with history of breast cancer at age 53. ? ?ROS:  A ROS was performed and pertinent positives and negatives are included. ? ?Exam: ? ?Vitals:  ? 05/06/21 0825  ?BP: 124/80  ?Weight: 236 lb (107 kg)  ?Height: 5' 5.5" (1.664 m)  ? ? ?Body mass index is 38.68 kg/m?. ? ?General appearance:  Normal ?Thyroid:  Symmetrical, normal in size, without palpable masses or nodularity. ?Respiratory ? Auscultation:  Clear without wheezing or rhonchi ?Cardiovascular ? Auscultation:  Regular rate, without rubs, murmurs or gallops ? Edema/varicosities:  Not grossly evident ?Abdominal ? Soft,nontender, without masses, guarding or rebound. ? Liver/spleen:  No organomegaly noted ? Hernia:  None  appreciated ? Skin ? Inspection:  Grossly normal ?Breasts: Examined lying and sitting.  ? Right: Without masses, retractions, nipple discharge or axillary adenopathy. ? ? Left: Without masses, retractions, nipple discharge or axillary adenopathy. ?Gentitourinary  ? Inguinal/mons:  Normal without inguinal adenopathy ? External genitalia:  Normal appearing vulva with no masses, tenderness, or lesions ? BUS/Urethra/Skene's glands:  Normal ? Vagina:  Normal appearing with normal color and discharge, no lesions ? Cervix:  Normal appearing without discharge or lesions ? Uterus:  Normal in size, shape and contour.  Midline and mobile, nontender ? Adnexa/parametria:   ?  Rt: Normal in size, without masses or tenderness. ?  Lt: Normal in size, without masses or tenderness. ? Anus and perineum: Normal ? Digital rectal exam: Normal sphincter tone without palpated masses or tenderness ? ?Patient informed chaperone available to be present for breast and pelvic exam. Patient has requested no chaperone to be present. Patient has been advised what will be completed during breast and pelvic exam.  ? ?Wet prep + clue cells (+ odor) ? ?Assessment/Plan:  46 y.o. G1P1001 for annual exam.  ? ?Well female exam with routine gynecological exam - Plan: Cytology - PAP( [place]). Education provided on SBEs, importance of preventative screenings, current guidelines, high calcium diet, regular exercise, and multivitamin daily.  Labs with PCP.  ? ?Encounter for surveillance of contraceptive pills - Plan: norethindrone (ORTHO MICRONOR) 0.35 MG tablet daily. She wants to restart POPs. She is not interested in IUD, Depo, or Nexplanon. H/O HTN. ? ?Unusually frequent menses - Plan: Thyroid Panel With TSH, US PELVIS TRANSVAGINAL NON-OB (TV ONLY). We did discuss changes in menses that can occur in the 40s. Will schedule ultrasound. If normal, we discussed option to switch  to [facility] or other progestin-only method but she wants to restart Micronor.   ? ?Vaginal odor - Plan: WET PREP FOR TRICH, YEAST, CLUE. Positive for clue cells.  ? ?Bacterial vaginosis - Plan: metroNIDAZOLE (FLAGYL) 500 MG tablet BID x 7 days.  ? ?Screen for STD (sexually transmitted disease) - Plan: Cytology - PAP( [place]), RPR, HIV Antibody (routine testing w rflx).  ? ?Screening for cervical cancer - 2015 LGSIL with negative biopsy. Pap today.  ? ?Screening for breast cancer - Normal mammogram history.  Continue annual screenings.  Normal breast exam today. Mother diagnosed with breast cancer at age 52.  ? ?Screening for colon cancer - Discussed current guidelines and recommendations to start screenings at age 6.  ? ?Return in 1 year for annual.  ? ? ? ?Robin Mackie DNP, 9:29 AM 05/06/2021 ? ?

## 2021-05-07 LAB — RPR: RPR Ser Ql: NONREACTIVE

## 2021-05-07 LAB — CYTOLOGY - PAP
Chlamydia: NEGATIVE
Comment: NEGATIVE
Comment: NEGATIVE
Comment: NORMAL
Diagnosis: NEGATIVE
High risk HPV: NEGATIVE
Neisseria Gonorrhea: NEGATIVE

## 2021-05-07 LAB — THYROID PANEL WITH TSH
Free Thyroxine Index: 2.7 (ref 1.4–3.8)
T3 Uptake: 30 % (ref 22–35)
T4, Total: 9.1 ug/dL (ref 5.1–11.9)
TSH: 2.63 mIU/L

## 2021-05-07 LAB — HIV ANTIBODY (ROUTINE TESTING W REFLEX): HIV 1&2 Ab, 4th Generation: NONREACTIVE

## 2021-05-10 ENCOUNTER — Ambulatory Visit: Payer: BC Managed Care – PPO | Admitting: Professional

## 2021-05-21 ENCOUNTER — Ambulatory Visit (INDEPENDENT_AMBULATORY_CARE_PROVIDER_SITE_OTHER): Payer: BC Managed Care – PPO | Admitting: Adult Health

## 2021-05-21 ENCOUNTER — Encounter (INDEPENDENT_AMBULATORY_CARE_PROVIDER_SITE_OTHER): Payer: Self-pay | Admitting: Adult Health

## 2021-05-21 VITALS — BP 132/84 | HR 104 | Temp 98.2°F | Ht 66.0 in | Wt 237.0 lb

## 2021-05-21 DIAGNOSIS — E559 Vitamin D deficiency, unspecified: Secondary | ICD-10-CM

## 2021-05-21 DIAGNOSIS — N921 Excessive and frequent menstruation with irregular cycle: Secondary | ICD-10-CM | POA: Diagnosis not present

## 2021-05-21 DIAGNOSIS — E669 Obesity, unspecified: Secondary | ICD-10-CM

## 2021-05-21 DIAGNOSIS — E785 Hyperlipidemia, unspecified: Secondary | ICD-10-CM

## 2021-05-21 DIAGNOSIS — E1169 Type 2 diabetes mellitus with other specified complication: Secondary | ICD-10-CM

## 2021-05-21 DIAGNOSIS — Z9189 Other specified personal risk factors, not elsewhere classified: Secondary | ICD-10-CM

## 2021-05-21 DIAGNOSIS — Z7985 Long-term (current) use of injectable non-insulin antidiabetic drugs: Secondary | ICD-10-CM

## 2021-05-21 DIAGNOSIS — Z6838 Body mass index (BMI) 38.0-38.9, adult: Secondary | ICD-10-CM

## 2021-05-21 MED ORDER — SEMAGLUTIDE (2 MG/DOSE) 8 MG/3ML ~~LOC~~ SOPN: 2.0000 mg | PEN_INJECTOR | SUBCUTANEOUS | 0 refills | Status: DC

## 2021-05-22 DIAGNOSIS — Z0000 Encounter for general adult medical examination without abnormal findings: Secondary | ICD-10-CM | POA: Diagnosis not present

## 2021-05-23 ENCOUNTER — Other Ambulatory Visit: Payer: Self-pay | Admitting: Obstetrics and Gynecology

## 2021-05-23 ENCOUNTER — Other Ambulatory Visit: Payer: BC Managed Care – PPO

## 2021-05-28 NOTE — Progress Notes (Signed)
? ? ? ?Chief Complaint:  ? ?OBESITY ?Robin Glover is here to discuss her progress with her obesity treatment plan along with follow-up of her obesity related diagnoses. Robin Glover is on following a lower carbohydrate, vegetable and lean protein rich diet plan and states she is following her eating plan approximately 0% of the time. Robin Glover states she is walking 10,000 steps per day 2-3 times per week. ? ?Today's visit was #: 28 ?Starting weight: 250 lbs ?Starting date: 02/09/2019 ?Today's weight: 237 lbs ?Today's date: 05/21/21 ?Total lbs lost to date: 13 ?Total lbs lost since last in-office visit: gained 3 lbs. ? ?Interim History:  ?Robin Glover states,"I have been in a Free Fall" - not following any prescribed meal plan.   ?Low CHO-unable to follow- too restrictive. ? ?Subjective:  ? ?1. Type 2 diabetes mellitus with other specified complication, without long-term current use of insulin (HCC) ?Discussed labs with patient today. ?Today will be her first increased dose of Ozempic 2 mg. ?She denies mass in neck, dysphagia, dyspepsia, persistent hoarseness, abd pain, or NV/Constipation. ?04/18/2021 A1c 5.5-at goal. ? ?2. Vitamin D deficiency ?Discussed labs with patient today. ?04/18/2021 vitamin D level-61.1-stable. ?On weekly ergocalciferol-denies nausea, vomiting, muscle weakness. ? ?3. Hyperlipidemia associated with type 2 diabetes mellitus (HCC) ?Discussed labs with patient today. ?04/18/2021 lipid panel-at goal. ?On atorvastatin 20 mg daily-denies myalgias. ? ?4. Menorrhagia with irregular cycle ?Discussed labs with patient today. ?04/18/21 CBC-stable. ? ?5. At risk for hypoglycemia ?Robin Glover is at risk for hypoglycemia due to having well-controlled type 2 diabetes. ? ?Assessment/Plan:  ? ?1. Type 2 diabetes mellitus with other specified complication, without long-term current use of insulin (HCC) ?Refill: ?- Semaglutide, 2 MG/DOSE, 8 MG/3ML SOPN; Inject 2 mg as directed once a week.  Dispense: 3 mL; Refill: 0 ? ?2. Vitamin D  deficiency ?Continue weekly ergocalciferol. ? ?3. Hyperlipidemia associated with type 2 diabetes mellitus (HCC) ?Continue statin therapy. ? ?4. Menorrhagia with irregular cycle ?Follow-up with gynecology. ? ?5. At risk for hypoglycemia ?Robin Glover was given approximately 15 minutes of counseling today regarding prevention of hypoglycemia. She was advised of symptoms of hypoglycemia. Robin Glover was instructed to avoid skipping meals, eat regular protein rich meals, and schedule low calorie snacks as needed. ? ?Repetitive spaced learning was employed today to elicit superior memory formation and behavioral change. ? ?6. Obesity: Current BMI 38.3 ?Handouts: ?Category 2 meal plan/grocery list ?Eating out guide ? ?Robin Glover is currently in the action stage of change. As such, her goal is to continue with weight loss efforts. She has agreed to keeping a food journal and adhering to recommended goals of 1200 calories and 85 gms protein.  ? ?Exercise goals: as is. ? ?Behavioral modification strategies: increasing lean protein intake, decreasing simple carbohydrates, meal planning and cooking strategies, keeping healthy foods in the home, and planning for success. ? ?Robin Glover has agreed to follow-up with our clinic in 4 weeks. She was informed of the importance of frequent follow-up visits to maximize her success with intensive lifestyle modifications for her multiple health conditions.  ? ?Objective:  ? ?Blood pressure 132/84, pulse (!) 104, temperature 98.2 ?F (36.8 ?C), height 5\' 6"  (1.676 m), weight 237 lb (107.5 kg), last menstrual period 05/01/2021, SpO2 99 %. ?Body mass index is 38.25 kg/m?. ? ?General: Cooperative, alert, well developed, in no acute distress. ?HEENT: Conjunctivae and lids unremarkable. ?Cardiovascular: Regular rhythm.  ?Lungs: Normal work of breathing. ?Neurologic: No focal deficits.  ? ?Lab Results  ?Component Value Date  ? CREATININE 0.75 04/18/2021  ?  BUN 10 04/18/2021  ? NA 145 (H) 04/18/2021  ? K 5.0  04/18/2021  ? CL 106 04/18/2021  ? CO2 24 04/18/2021  ? ?Lab Results  ?Component Value Date  ? ALT 15 04/18/2021  ? AST 13 04/18/2021  ? ALKPHOS 67 04/18/2021  ? BILITOT 0.5 04/18/2021  ? ?Lab Results  ?Component Value Date  ? HGBA1C 5.5 04/18/2021  ? HGBA1C 5.4 04/25/2020  ? HGBA1C 5.7 (H) 10/31/2019  ? HGBA1C 5.7 (H) 06/09/2019  ? HGBA1C 5.8 (H) 02/09/2019  ? ?Lab Results  ?Component Value Date  ? INSULIN 10.3 04/18/2021  ? INSULIN 10.6 10/31/2019  ? INSULIN 13.7 06/09/2019  ? INSULIN 12.5 02/09/2019  ? ?Lab Results  ?Component Value Date  ? TSH 2.63 05/06/2021  ? ?Lab Results  ?Component Value Date  ? CHOL 133 04/18/2021  ? HDL 45 04/18/2021  ? LDLCALC 77 04/18/2021  ? TRIG 49 04/18/2021  ? CHOLHDL 3.0 04/18/2021  ? ?Lab Results  ?Component Value Date  ? VD25OH 61.1 04/18/2021  ? VD25OH 52.5 12/26/2020  ? VD25OH 52.2 04/25/2020  ? ?Lab Results  ?Component Value Date  ? WBC 6.5 04/18/2021  ? HGB 12.9 04/18/2021  ? HCT 39.6 04/18/2021  ? MCV 92 04/18/2021  ? PLT 306 04/18/2021  ? ?Lab Results  ?Component Value Date  ? IRON 88 06/09/2019  ? TIBC 351 06/09/2019  ? FERRITIN 73 06/09/2019  ? ? ?Attestation Statements:  ? ?Reviewed by clinician on day of visit: allergies, medications, problem list, medical history, surgical history, family history, social history, and previous encounter notes. ? ?I, Jesse Sans, FNP, am acting as Energy manager for William Hamburger, NP. ? ?I have reviewed the above documentation for accuracy and completeness, and I agree with the above. -  Carlosdaniel Grob d. Kamil Hanigan, NP-C ? ?

## 2021-05-29 DIAGNOSIS — N921 Excessive and frequent menstruation with irregular cycle: Secondary | ICD-10-CM | POA: Insufficient documentation

## 2021-06-19 DIAGNOSIS — N898 Other specified noninflammatory disorders of vagina: Secondary | ICD-10-CM | POA: Diagnosis not present

## 2021-06-19 DIAGNOSIS — R3915 Urgency of urination: Secondary | ICD-10-CM | POA: Diagnosis not present

## 2021-06-20 ENCOUNTER — Ambulatory Visit (INDEPENDENT_AMBULATORY_CARE_PROVIDER_SITE_OTHER): Payer: BC Managed Care – PPO | Admitting: Adult Health

## 2021-06-20 ENCOUNTER — Encounter (INDEPENDENT_AMBULATORY_CARE_PROVIDER_SITE_OTHER): Payer: Self-pay

## 2021-07-02 ENCOUNTER — Ambulatory Visit (INDEPENDENT_AMBULATORY_CARE_PROVIDER_SITE_OTHER): Payer: BC Managed Care – PPO | Admitting: Obstetrics and Gynecology

## 2021-07-02 ENCOUNTER — Ambulatory Visit: Payer: BC Managed Care – PPO

## 2021-07-02 ENCOUNTER — Encounter: Payer: Self-pay | Admitting: Obstetrics and Gynecology

## 2021-07-02 VITALS — BP 124/82

## 2021-07-02 DIAGNOSIS — N920 Excessive and frequent menstruation with regular cycle: Secondary | ICD-10-CM | POA: Diagnosis not present

## 2021-07-02 DIAGNOSIS — Z3009 Encounter for other general counseling and advice on contraception: Secondary | ICD-10-CM | POA: Diagnosis not present

## 2021-07-02 DIAGNOSIS — N939 Abnormal uterine and vaginal bleeding, unspecified: Secondary | ICD-10-CM | POA: Diagnosis not present

## 2021-07-02 NOTE — Progress Notes (Signed)
GYNECOLOGY  VISIT   HPI: 46 y.o.   Divorced Black or [place] American Not Hispanic or Latino  female   G1P1001 with No LMP recorded.   here for  evaluation of AUB. She was having frequent bleeding on POP's, went off of the POP for several months and frequent bleeding persisted so Wyline Beady set her up for an ultrasound. She was restarted on micronor in 4/23.   She thinks she is having a cycle ~q 2.5 weeks x 3.5-4 days. This has been happening for approximately the last 6 months. She can saturate a super tampon in up to 4 hours. Sometimes has clots and or cramps. She has premenstrual symptoms prior to every cycle.  She had a normal thyroid panel, normal hgb and normal pap in 4/23.   She previous had a mirnena IUD, did well with it for 2 years, then developed pain and her IUD expulsed.  No vasomotor symptoms, no vaginal dryness. No change in her sleep issues.   GYNECOLOGIC HISTORY: No LMP recorded. Contraception:Pill  Menopausal hormone therapy: none        OB History     Gravida  1   Para  1   Term  1   Preterm      AB      Living  1      SAB      IAB      Ectopic      Multiple      Live Births                 Patient Active Problem List   Diagnosis Date Noted   Menorrhagia with irregular cycle 05/29/2021   Adjustment disorder with mixed anxiety and depressed mood 03/04/2021   Other constipation 02/21/2021   Depression 11/01/2019   Class 3 severe obesity with serious comorbidity and body mass index (BMI) of 40.0 to 44.9 in adult [facility]) 06/09/2019   Hyperlipidemia associated with type 2 diabetes mellitus (HCC) 03/24/2019   Vitamin D deficiency 02/14/2019   Class 2 severe obesity with serious comorbidity and body mass index (BMI) of 37.0 to 37.9 in adult (HCC) 02/09/2019   Hypercholesteremia 04/19/2018   Diabetes mellitus (HCC) 04/19/2018   CIN I (cervical intraepithelial neoplasia I) 01/23/2014   Family history of breast cancer 01/23/2014   Essential  hypertension, benign 12/08/2012    Past Medical History:  Diagnosis Date   Anxiety    Chronic back pain    Diabetes (HCC)    Food allergy    Gallbladder problem    GERD (gastroesophageal reflux disease)    High cholesterol    Hormone disorder    Hypertension    IBS (irritable bowel syndrome)    Lactose intolerance    Pre-diabetes    Sleep apnea     Past Surgical History:  Procedure Laterality Date   CESAREAN SECTION     CHOLECYSTECTOMY      Current Outpatient Medications  Medication Sig Dispense Refill   atorvastatin (LIPITOR) 20 MG tablet Take 20 mg by mouth daily.     diphenhydrAMINE (BENADRYL) 25 MG tablet Take 25 mg by mouth every 6 (six) hours as needed.     lisinopril (ZESTRIL) 10 MG tablet Take 10 mg by mouth daily.     metroNIDAZOLE (FLAGYL) 500 MG tablet Take 1 tablet (500 mg total) by mouth 2 (two) times daily. 14 tablet 0   Multiple Vitamin (MULTIVITAMIN) tablet Take 1 tablet by mouth daily.  norethindrone (ORTHO MICRONOR) 0.35 MG tablet Take 1 tablet (0.35 mg total) by mouth daily. 84 tablet 3   Semaglutide, 2 MG/DOSE, 8 MG/3ML SOPN Inject 2 mg as directed once a week. 3 mL 0   Vitamin D, Ergocalciferol, (DRISDOL) 1.25 MG (50000 UNIT) CAPS capsule Take 1 capsule (50,000 Units total) by mouth every 7 (seven) days. 12 capsule 0   No current facility-administered medications for this visit.     ALLERGIES: Almond oil, Shellfish allergy, Soma [carisoprodol], and Tart cherry advanced [germanium]  Family History  Problem Relation Age of Onset   Breast cancer Mother 22   Diabetes Mother    Hypertension Mother    High Cholesterol Mother    Obesity Mother    Heart disease Father    Diabetes Father    High blood pressure Father    High Cholesterol Father    Obesity Father    Sleep apnea Father    Diabetes Brother    Hypertension Brother     Social History   Socioeconomic History   Marital status: Divorced    Spouse name: Not on file   Number of  children: Not on file   Years of education: Not on file   Highest education level: Not on file  Occupational History   Occupation: Chemists  Tobacco Use   Smoking status: Never   Smokeless tobacco: Never  Vaping Use   Vaping Use: Never used  Substance and Sexual Activity   Alcohol use: Yes    Comment: Occas   Drug use: No   Sexual activity: Yes    Partners: Male    Birth control/protection: Pill    Comment: 1st intercourse- 25, partners- 5, current partner- 6 months   Other Topics Concern   Not on file  Social History Narrative   Not on file   Social Determinants of Health   Financial Resource Strain: Not on file  Food Insecurity: Not on file  Transportation Needs: Not on file  Physical Activity: Not on file  Stress: Not on file  Social Connections: Not on file  Intimate Partner Violence: Not on file    ROS  PHYSICAL EXAMINATION:    There were no vitals taken for this visit.    General appearance: alert, cooperative and appears stated age  Ultrasound images reviewed with the patient  1. Abnormal uterine bleeding On POP, persisted for several months off of POP Essentially normal pelvic ultrasound with thin endometrium Menstrual calendar given  2. General counseling and advice on female contraception Not a candidate for OCP's -Discussed other progesterone only forms of contraception, including depo-provera (wouldn't recommend secondary to concern of weight gain), nexplanon, slynd, and the mirena IUD.  -She is interested in the mirena IUD, discussed risks of bleeding, infection, uterine perforation and expulsion.

## 2021-07-10 ENCOUNTER — Other Ambulatory Visit (INDEPENDENT_AMBULATORY_CARE_PROVIDER_SITE_OTHER): Payer: Self-pay | Admitting: Adult Health

## 2021-07-10 DIAGNOSIS — E1169 Type 2 diabetes mellitus with other specified complication: Secondary | ICD-10-CM

## 2021-07-31 ENCOUNTER — Ambulatory Visit (INDEPENDENT_AMBULATORY_CARE_PROVIDER_SITE_OTHER): Payer: BC Managed Care – PPO | Admitting: Obstetrics and Gynecology

## 2021-07-31 ENCOUNTER — Encounter: Payer: Self-pay | Admitting: Obstetrics and Gynecology

## 2021-07-31 VITALS — BP 122/80 | Ht 66.0 in | Wt 242.0 lb

## 2021-07-31 DIAGNOSIS — F419 Anxiety disorder, unspecified: Secondary | ICD-10-CM | POA: Insufficient documentation

## 2021-07-31 DIAGNOSIS — Z30430 Encounter for insertion of intrauterine contraceptive device: Secondary | ICD-10-CM

## 2021-07-31 DIAGNOSIS — G4709 Other insomnia: Secondary | ICD-10-CM | POA: Insufficient documentation

## 2021-07-31 DIAGNOSIS — Z01812 Encounter for preprocedural laboratory examination: Secondary | ICD-10-CM

## 2021-07-31 DIAGNOSIS — E785 Hyperlipidemia, unspecified: Secondary | ICD-10-CM | POA: Insufficient documentation

## 2021-07-31 DIAGNOSIS — K589 Irritable bowel syndrome without diarrhea: Secondary | ICD-10-CM | POA: Insufficient documentation

## 2021-07-31 DIAGNOSIS — I10 Essential (primary) hypertension: Secondary | ICD-10-CM | POA: Insufficient documentation

## 2021-07-31 DIAGNOSIS — F329 Major depressive disorder, single episode, unspecified: Secondary | ICD-10-CM | POA: Insufficient documentation

## 2021-07-31 DIAGNOSIS — E8881 Metabolic syndrome: Secondary | ICD-10-CM | POA: Insufficient documentation

## 2021-07-31 DIAGNOSIS — K219 Gastro-esophageal reflux disease without esophagitis: Secondary | ICD-10-CM | POA: Insufficient documentation

## 2021-07-31 DIAGNOSIS — B948 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: Secondary | ICD-10-CM | POA: Insufficient documentation

## 2021-07-31 DIAGNOSIS — Z3009 Encounter for other general counseling and advice on contraception: Secondary | ICD-10-CM

## 2021-07-31 DIAGNOSIS — J309 Allergic rhinitis, unspecified: Secondary | ICD-10-CM | POA: Insufficient documentation

## 2021-07-31 DIAGNOSIS — M5136 Other intervertebral disc degeneration, lumbar region: Secondary | ICD-10-CM | POA: Insufficient documentation

## 2021-07-31 LAB — PREGNANCY, URINE: Preg Test, Ur: NEGATIVE

## 2021-07-31 NOTE — Progress Notes (Signed)
GYNECOLOGY  VISIT   HPI: 46 y.o.   Divorced Black or [place] American Not Hispanic or Latino  female   G1P1001 with Patient's last menstrual period was 06/30/2021.   here for Mirena IUD insertion. She has been having irregular bleeding on POP and desires a mirena. Normal u/s.  GYNECOLOGIC HISTORY: Patient's last menstrual period was 06/30/2021. Contraception: POP Menopausal hormone therapy: none         OB History     Gravida  1   Para  1   Term  1   Preterm      AB      Living  1      SAB      IAB      Ectopic      Multiple      Live Births                 Patient Active Problem List   Diagnosis Date Noted   Menorrhagia with irregular cycle 05/29/2021   Adjustment disorder with mixed anxiety and depressed mood 03/04/2021   Other constipation 02/21/2021   Depression 11/01/2019   Class 3 severe obesity with serious comorbidity and body mass index (BMI) of 40.0 to 44.9 in adult [facility]) 06/09/2019   Hyperlipidemia associated with type 2 diabetes mellitus (HCC) 03/24/2019   Vitamin D deficiency 02/14/2019   Class 2 severe obesity with serious comorbidity and body mass index (BMI) of 37.0 to 37.9 in adult (HCC) 02/09/2019   Hypercholesteremia 04/19/2018   Diabetes mellitus (HCC) 04/19/2018   CIN I (cervical intraepithelial neoplasia I) 01/23/2014   Family history of breast cancer 01/23/2014   Essential hypertension, benign 12/08/2012    Past Medical History:  Diagnosis Date   Anxiety    Chronic back pain    Diabetes (HCC)    Food allergy    Gallbladder problem    GERD (gastroesophageal reflux disease)    High cholesterol    Hormone disorder    Hypertension    IBS (irritable bowel syndrome)    Lactose intolerance    Pre-diabetes    Sleep apnea     Past Surgical History:  Procedure Laterality Date   CESAREAN SECTION     CHOLECYSTECTOMY      Current Outpatient Medications  Medication Sig Dispense Refill   atorvastatin (LIPITOR) 20 MG tablet  Take 20 mg by mouth daily.     diphenhydrAMINE (BENADRYL) 25 MG tablet Take 25 mg by mouth every 6 (six) hours as needed.     lisinopril (ZESTRIL) 10 MG tablet Take 10 mg by mouth daily.     Multiple Vitamin (MULTIVITAMIN) tablet Take 1 tablet by mouth daily.     norethindrone (ORTHO MICRONOR) 0.35 MG tablet Take 1 tablet (0.35 mg total) by mouth daily. 84 tablet 3   Semaglutide, 2 MG/DOSE, 8 MG/3ML SOPN Inject 2 mg as directed once a week. 3 mL 0   Vitamin D, Ergocalciferol, (DRISDOL) 1.25 MG (50000 UNIT) CAPS capsule Take 1 capsule (50,000 Units total) by mouth every 7 (seven) days. 12 capsule 0   No current facility-administered medications for this visit.     ALLERGIES: Almond oil, Shellfish allergy, Soma [carisoprodol], and Tart cherry advanced [germanium]  Family History  Problem Relation Age of Onset   Breast cancer Mother 38   Diabetes Mother    Hypertension Mother    High Cholesterol Mother    Obesity Mother    Heart disease Father    Diabetes Father  High blood pressure Father    High Cholesterol Father    Obesity Father    Sleep apnea Father    Diabetes Brother    Hypertension Brother     Social History   Socioeconomic History   Marital status: Divorced    Spouse name: Not on file   Number of children: Not on file   Years of education: Not on file   Highest education level: Not on file  Occupational History   Occupation: Chemists  Tobacco Use   Smoking status: Never   Smokeless tobacco: Never  Vaping Use   Vaping Use: Never used  Substance and Sexual Activity   Alcohol use: Yes    Comment: Occas   Drug use: No   Sexual activity: Yes    Partners: Male    Birth control/protection: Pill    Comment: 1st intercourse- 25, partners- 5, current partner- 6 months   Other Topics Concern   Not on file  Social History Narrative   Not on file   Social Determinants of Health   Financial Resource Strain: Not on file  Food Insecurity: Not on file   Transportation Needs: Not on file  Physical Activity: Not on file  Stress: Not on file  Social Connections: Not on file  Intimate Partner Violence: Not on file    Review of Systems  All other systems reviewed and are negative.   PHYSICAL EXAMINATION:    BP 122/80   Ht 5\' 6"  (1.676 m)   Wt 242 lb (109.8 kg)   LMP 06/30/2021   SpO2 100%   BMI 39.06 kg/m     General appearance: alert, cooperative and appears stated age  Pelvic: External genitalia:  no lesions              Urethra:  normal appearing urethra with no masses, tenderness or lesions              Bartholins and Skenes: normal                 Vagina: normal appearing vagina with normal color and discharge, no lesions              Cervix: no lesions               The risks of the mirnea IUD were reviewed with the patient, including infection, abnormal bleeding and uterine perfortion. Consent was signed.  A speculum was placed in the vagina, the cervix was cleansed with betadine. A tenaculum was placed on the cervix, the uterus sounded to 8 cm. The cervix was dilated to a 5 hagar dilator  The mirena IUD was inserted without difficulty. The string were cut to 3 cm.    The patient tolerated the procedure well.   Chaperone was present for exam.  1. General counseling and advice on female contraception - IUD Insertion, mirena -F/U in one month  2. Pre-procedure lab exam - Pregnancy, urine

## 2021-07-31 NOTE — Patient Instructions (Signed)
IUD Post-procedure Instructions Cramping is common.  You may take Ibuprofen, Aleve, or Tylenol for the cramping.  This should resolve within 24 hours.   You may have a small amount of spotting.  You should wear a mini pad for the next few days. You may have intercourse in 24 hours. You need to call the office if you have any pelvic pain, fever, heavy bleeding, or foul smelling vaginal discharge. Shower or bathe as normal Use back up contraception for one week 

## 2021-08-21 ENCOUNTER — Encounter (INDEPENDENT_AMBULATORY_CARE_PROVIDER_SITE_OTHER): Payer: Self-pay

## 2021-08-28 ENCOUNTER — Ambulatory Visit: Payer: BC Managed Care – PPO | Admitting: Obstetrics and Gynecology

## 2021-08-28 ENCOUNTER — Encounter: Payer: Self-pay | Admitting: Obstetrics and Gynecology

## 2021-08-28 VITALS — BP 110/62 | Wt 242.0 lb

## 2021-08-28 DIAGNOSIS — Z30431 Encounter for routine checking of intrauterine contraceptive device: Secondary | ICD-10-CM

## 2021-08-28 DIAGNOSIS — B9689 Other specified bacterial agents as the cause of diseases classified elsewhere: Secondary | ICD-10-CM

## 2021-08-28 DIAGNOSIS — N898 Other specified noninflammatory disorders of vagina: Secondary | ICD-10-CM

## 2021-08-28 DIAGNOSIS — N760 Acute vaginitis: Secondary | ICD-10-CM | POA: Diagnosis not present

## 2021-08-28 LAB — WET PREP FOR TRICH, YEAST, CLUE

## 2021-08-28 MED ORDER — METRONIDAZOLE 500 MG PO TABS: 500.0000 mg | ORAL_TABLET | Freq: Two times a day (BID) | ORAL | 0 refills | Status: DC

## 2021-08-28 NOTE — Patient Instructions (Signed)

## 2021-08-28 NOTE — Progress Notes (Signed)
GYNECOLOGY  VISIT   HPI: 46 y.o.   Divorced Black or [place] American Not Hispanic or Latino  female   G1P1001 with No LMP recorded. (Menstrual status: IUD).   here for one month IUD follow up. She was having irregular bleeding on POP, normal ultrasound. She had a mirena IUD inserted on 07/31/21 and is here for f/u. Patient states that she is still spotting.  Thinks she may have had a cycle. She is having mild cramping. Not sexually active since the IUD insertion.   She has noticed odor off an on for the last month. No increase in vaginal d/c.    GYNECOLOGIC HISTORY: No LMP recorded. (Menstrual status: IUD). Contraception:IUD Menopausal hormone therapy: none         OB History     Gravida  1   Para  1   Term  1   Preterm      AB      Living  1      SAB      IAB      Ectopic      Multiple      Live Births                 Patient Active Problem List   Diagnosis Date Noted   Allergic rhinitis 07/31/2021   Anxiety 07/31/2021   DDD (degenerative disc disease), lumbar 07/31/2021   Dyslipidemia 07/31/2021   Esophageal reflux 07/31/2021   Hypercalcemia 07/31/2021   Hypertension 07/31/2021   Irritable bowel syndrome 07/31/2021   Major depressive disorder, single episode, unspecified 07/31/2021   Metabolic syndrome 07/31/2021   Other insomnia 07/31/2021   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 07/31/2021   Menorrhagia with irregular cycle 05/29/2021   Adjustment disorder with mixed anxiety and depressed mood 03/04/2021   Other constipation 02/21/2021   Depression 11/01/2019   Class 3 severe obesity with serious comorbidity and body mass index (BMI) of 40.0 to 44.9 in adult (HCC) 06/09/2019   Hyperlipidemia associated with type 2 diabetes mellitus (HCC) 03/24/2019   Vitamin D deficiency 02/14/2019   Class 2 severe obesity with serious comorbidity and body mass index (BMI) of 37.0 to 37.9 in adult (HCC) 02/09/2019   Hypercholesteremia  04/19/2018   Diabetes mellitus (HCC) 04/19/2018   CIN I (cervical intraepithelial neoplasia I) 01/23/2014   Family history of breast cancer 01/23/2014   Essential hypertension, benign 12/08/2012    Past Medical History:  Diagnosis Date   Anxiety    Chronic back pain    Diabetes (HCC)    Food allergy    Gallbladder problem    GERD (gastroesophageal reflux disease)    High cholesterol    Hormone disorder    Hypertension    IBS (irritable bowel syndrome)    Lactose intolerance    Pre-diabetes    Sleep apnea     Past Surgical History:  Procedure Laterality Date   CESAREAN SECTION     CHOLECYSTECTOMY      Current Outpatient Medications  Medication Sig Dispense Refill   atorvastatin (LIPITOR) 20 MG tablet Take 20 mg by mouth daily.     diphenhydrAMINE (BENADRYL) 25 MG tablet Take 25 mg by mouth every 6 (six) hours as needed.     lisinopril (ZESTRIL) 10 MG tablet Take 10 mg by mouth daily.     Multiple Vitamin (MULTIVITAMIN) tablet Take 1 tablet by mouth daily.     Semaglutide, 2 MG/DOSE, 8 MG/3ML SOPN Inject 2 mg as directed  once a week. 3 mL 0   Vitamin D, Ergocalciferol, (DRISDOL) 1.25 MG (50000 UNIT) CAPS capsule Take 1 capsule (50,000 Units total) by mouth every 7 (seven) days. 12 capsule 0   No current facility-administered medications for this visit.     ALLERGIES: Almond oil, Sesame seed (diagnostic), Shellfish allergy, Soma [carisoprodol], and Tart cherry advanced [germanium]  Family History  Problem Relation Age of Onset   Breast cancer Mother 16   Diabetes Mother    Hypertension Mother    High Cholesterol Mother    Obesity Mother    Heart disease Father    Diabetes Father    High blood pressure Father    High Cholesterol Father    Obesity Father    Sleep apnea Father    Diabetes Brother    Hypertension Brother     Social History   Socioeconomic History   Marital status: Divorced    Spouse name: Not on file   Number of children: Not on file    Years of education: Not on file   Highest education level: Not on file  Occupational History   Occupation: Chemists  Tobacco Use   Smoking status: Never   Smokeless tobacco: Never  Vaping Use   Vaping Use: Never used  Substance and Sexual Activity   Alcohol use: Yes    Comment: Occas   Drug use: No   Sexual activity: Yes    Partners: Male    Birth control/protection: Pill    Comment: 1st intercourse- 25, partners- 5, current partner- 6 months   Other Topics Concern   Not on file  Social History Narrative   Not on file   Social Determinants of Health   Financial Resource Strain: Not on file  Food Insecurity: Not on file  Transportation Needs: Not on file  Physical Activity: Not on file  Stress: Not on file  Social Connections: Not on file  Intimate Partner Violence: Not on file    ROS  PHYSICAL EXAMINATION:    BP 110/62   Wt 242 lb (109.8 kg)   BMI 39.06 kg/m     General appearance: alert, cooperative and appears stated age  Pelvic: External genitalia:  no lesions              Urethra:  normal appearing urethra with no masses, tenderness or lesions              Bartholins and Skenes: normal                 Vagina: normal appearing vagina with normal color. There was a slight increase in thick, white vaginal discharge. No blood seen              Cervix: no cervical motion tenderness, no lesions, and IUD string 3 cm              Bimanual Exam:  Uterus:  normal size, contour, position, consistency, mobility, non-tender and anteverted              Adnexa:  no masses, mildly tender on the left               Chaperone was present for exam.  1. IUD check up Spotting, reassured that this is normal. Normal exam -Routine f/u  2. Vaginal odor - WET PREP FOR TRICH, YEAST, CLUE  3. Bacterial vaginitis - metroNIDAZOLE (FLAGYL) 500 MG tablet; Take 1 tablet (500 mg total) by mouth 2 (two) times daily.  Dispense: 14  tablet; Refill: 0 (no ETOH) -Discussed use of condoms to  decrease risk of recurrence

## 2021-11-08 DIAGNOSIS — R92323 Mammographic fibroglandular density, bilateral breasts: Secondary | ICD-10-CM | POA: Diagnosis not present

## 2021-11-08 DIAGNOSIS — Z1231 Encounter for screening mammogram for malignant neoplasm of breast: Secondary | ICD-10-CM | POA: Diagnosis not present

## 2021-11-12 ENCOUNTER — Other Ambulatory Visit: Payer: Self-pay | Admitting: Family Medicine

## 2021-11-12 DIAGNOSIS — R42 Dizziness and giddiness: Secondary | ICD-10-CM

## 2021-11-12 DIAGNOSIS — Z6839 Body mass index (BMI) 39.0-39.9, adult: Secondary | ICD-10-CM | POA: Diagnosis not present

## 2021-11-12 DIAGNOSIS — E1169 Type 2 diabetes mellitus with other specified complication: Secondary | ICD-10-CM | POA: Diagnosis not present

## 2021-11-28 ENCOUNTER — Encounter: Payer: BC Managed Care – PPO | Admitting: Professional

## 2021-11-28 ENCOUNTER — Encounter: Payer: Self-pay | Admitting: Professional

## 2021-11-28 NOTE — Progress Notes (Signed)
[facility] Counselor/Therapist Progress Note  Patient ID: Robin Glover, MRN: [id number],    Date: 11/28/2021  Time Spent: 44 minutes [id number] am  Treatment Type: Individual Therapy  Risk Assessment: Danger to Self:  No Self-injurious Behavior: No Danger to Others: No  Subjective: This session was held via video teletherapy due to the coronavirus risk at this time. The patient consented to video teletherapy and was located at her home during this session. She is aware it is the responsibility of the patient to secure confidentiality on her end of the session. The provider was in a private home office for the duration of this session.   The patient arrived late for her webex appointment appearing casualy groomed and easily engaged  Issues addressed: 1- family -daughter turned 36 years old  Treatment Plan Problems Addressed  Balancing Work and [employer], Caregiving of Aging Parents, Low Self-Esteem/Lack of Assertiveness, Single Parenting  Goals 1. Develop a balance between caregiving responsibilities and other roles, responsibilities, and interests. 2. Develop a realistic perspective regarding demands and obligations of multiple roles and their completion. 3. Develop a support system to assist in coping with stressors of single parenting. 4. Develop perception of parental caregiving as an opportunity for psychological and personal growth; acknowledge the benefits and the challenges. 5. Develop time management strategies to reduce role overload and role conflicts. 6. Implement self-care strategies to maintain physical and emotional health. Objective Delegate responsibilities in the home. Target Date: 2022-03-03 Frequency: Monthly  Progress: 0 Modality: individual  Objective Identify and implement stress management and self-care strategies. Target Date: 2022-03-03 Frequency: Monthly  Progress: 0 Modality: individual  Related  Interventions Encourage the client to employ stress management and self-care activities (e.g., yoga, guided relaxation, exercise, massage, bubble baths). Objective Describe the challenges, demands, and stressors associated with single parenting. Target Date: 2022-03-03 Frequency: Monthly  Progress: 0 Modality: individual  Related Interventions Validate the client's experiences of frustration, despair, and anxiety associated with single parenting and associated multiple roles. Objective Develop planning and problem-solving skills to more effectively cope with multiple demands. Target Date: 2022-03-03 Frequency: Monthly  Progress: 0 Modality: individual  Related Interventions Role-play with the client planning and assertive problem-solving skills (e.g., planning and defining the needs for assistance; determining when help is needed, how to ask for help, and whom to ask). Objective Identify feelings and behavior related to attempting to balance work and family roles. Target Date: 2022-03-03 Frequency: Monthly  Progress: 0 Modality: individual  Related Interventions Explore and identify the client's feelings of guilt, shame, and anxiety associated with not meeting internal or external expectations associated with balancing work and family roles. 7. Increase ability to express needs and desires openly and honestly. 8. Increase assertiveness skills and ability to advocate for self. 9. Increase openness to experiences and opportunities associated with risk-taking. Objective Practice saying "no" to at least one person, declining to comply with a request/favor or identify and assert rights, needs, and wants. Target Date: 2022-03-03 Frequency: Monthly  Progress: 0 Modality: individual  Objective Identify at least three positive personal attributes. Target Date: 2022-03-03 Frequency: Monthly  Progress: 0 Modality: individual  Related Interventions Explore the client's fears of being assertive  (e.g., fear of rejection, fear of ineffectiveness, fear of ridicule). Explore areas of competency with the client and encourage her to engage in related activities (e.g., dance, singing, arts, volunteer work, joining a book club). Objective Identify three roadblocks to improved self-esteem. Target Date: 2022-03-03 Frequency: Monthly  Progress: 0 Modality: individual  Diagnosis:No diagnosis found.  Plan:  -being more consistent with balance and boundaries. -meet again on Friday, May 10, 2021 at 11am.  Teofilo Pod, [facility]

## 2021-12-03 ENCOUNTER — Encounter: Payer: Self-pay | Admitting: Professional

## 2021-12-03 ENCOUNTER — Ambulatory Visit (INDEPENDENT_AMBULATORY_CARE_PROVIDER_SITE_OTHER): Payer: BC Managed Care – PPO | Admitting: Professional

## 2021-12-03 DIAGNOSIS — F4323 Adjustment disorder with mixed anxiety and depressed mood: Secondary | ICD-10-CM

## 2021-12-03 NOTE — Progress Notes (Signed)
[place] Counselor/Therapist Progress Note  Patient ID: Robin Glover, MRN: [id number],    Date: 12/03/2021  Time Spent: 51 minutes 1202-1253pm  Treatment Type: Individual Therapy  Risk Assessment: Danger to Self:  No Self-injurious Behavior: No Danger to Others: No  Subjective: This session was held via video teletherapy due to the coronavirus risk at this time. The patient consented to video teletherapy and was located at her home during this session. She is aware it is the responsibility of the patient to secure confidentiality on her end of the session. The provider was in a private home office for the duration of this session.   The patient arrived late for her webex appointment appearing casualy groomed and easily engaged  Issues addressed: 1-family a-father's condition has worsened and he is now in rehab b-pt suspects he will pass in the near future c-trying to find balance in her personal life d-between her needs and daughter's ned vs family needs -father yelling non-stop at the rehab disturbing other pts   -father has lost his drive and motivation to do anything for himself e-he was placed after he had an open wound on his foot -pt was going every other day to change his dressings -his wound was still not doing well in August and had an odor -vascular became involved due to decreased blood flow f-on Sept 15th when he went to his wound care appt   -the infection was at the bone   -he was admitted to hospital to immediately treat   -family sought 2nd opinion when amputation was discussed     -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   -many obstacles to get him a placement the family was satisfied with   -pt got him into a high quality facility     -the MD did not enter the orders correctly and delayed placement   -her father still has to have either an above or below the knee amputation -he has verbalized wanting to go home and  stopped eating and has been depressed   -a mental health person has seen him   -he is now on an antidepressant and it has helped with his appetite 2-grief -pt admits that she has some anxiety about receiving a call that her father is dying 3-mood a-pt realizes that she needs therapy and she doesn't need to stop b-she has been feeling "a roller coaster of emotions" -some days she is on top of things and sometimes she cannot catch her breath -some days she wants to cry and some days she is feeling frustration c-today is a good day since she just returned from vacation last evening -she was not having to deal with her regular stressors 4-professional -no longer working extra hours to cover need   -has more work -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 Develop a balance between caregiving responsibilities and other roles, responsibilities, and interests. 2. Develop a realistic perspective regarding demands and obligations of multiple roles and their completion. 3. Develop a support system to assist in coping with stressors of single parenting. 4. Develop  perception of parental caregiving as an opportunity for psychological and personal growth; acknowledge the benefits and the challenges. 5. Develop time management strategies to reduce role overload and role conflicts. 6. Implement self-care strategies to maintain physical and emotional health. Objective Delegate responsibilities in the home. Target Date: 2022-12-03 Frequency: Biweekly  Progress: 0 Modality: individual   Objective Identify and implement stress management and self-care strategies. Target Date: 2022-12-03 Frequency: Biweekly  Progress: 0 Modality: individual  Related Interventions Encourage the client to employ stress management and self-care activities (e.g., yoga, guided relaxation, exercise, massage, bubble baths). Objective Describe the challenges, demands, and stressors associated with single parenting. Target Date: 2022-12-03 Frequency: Biweekly  Progress: 0 Modality: individual  Related Interventions Validate the client's experiences of frustration, despair, and anxiety associated with single parenting and associated multiple roles. Objective Develop planning and problem-solving skills to more effectively cope with multiple demands. Target Date: 2022-12-03 Frequency: Biweekly  Progress: 0 Modality: individual  Related Interventions Role-play with the client planning and assertive problem-solving skills (e.g., planning and defining the needs for assistance; determining when help is needed, how to ask for help, and whom to ask). Objective Identify feelings and behavior related to attempting to balance work and family roles. Target Date: 2022-12-03 Frequency: Biweekly  Progress: 0 Modality: individual  Related Interventions Explore and identify the client's feelings of guilt, shame, and anxiety associated with not meeting internal or external expectations associated with balancing work and family roles. 7. Increase ability to express needs and desires openly and honestly. 8. Increase assertiveness skills and ability to advocate for self. 9. Increase openness to experiences and opportunities associated with risk-taking. Objective Practice saying "no" to at least one person, declining to comply with a request/favor or identify and assert rights, needs, and wants. Target Date: 2022-12-03 Frequency: Biweekly  Progress: 0 Modality: individual  Objective Identify at least three positive  personal attributes. Target Date: 2022-12-03 Frequency: Biweekly  Progress: 0 Modality: individual  Related Interventions Explore the client's fears of being assertive (e.g., fear of rejection, fear of ineffectiveness, fear of ridicule). Explore areas of competency with the client and encourage her to engage in related activities (e.g., dance, singing, arts, volunteer work, joining a book club). Objective Identify three roadblocks to improved self-esteem. Target Date: 2022-12-03 Frequency: Biweekly  Progress: 0 Modality: individual    Diagnosis:Adjustment disorder with mixed anxiety and depressed mood  Plan:  -meet every two weeks -next appointment will be Monday, December 16, 2021 at 11am.

## 2021-12-16 ENCOUNTER — Encounter: Payer: Self-pay | Admitting: Professional

## 2021-12-16 ENCOUNTER — Ambulatory Visit (INDEPENDENT_AMBULATORY_CARE_PROVIDER_SITE_OTHER): Payer: BC Managed Care – PPO | Admitting: Professional

## 2021-12-16 DIAGNOSIS — F4323 Adjustment disorder with mixed anxiety and depressed mood: Secondary | ICD-10-CM

## 2021-12-16 DIAGNOSIS — Z1211 Encounter for screening for malignant neoplasm of colon: Secondary | ICD-10-CM | POA: Diagnosis not present

## 2021-12-16 DIAGNOSIS — K5730 Diverticulosis of large intestine without perforation or abscess without bleeding: Secondary | ICD-10-CM | POA: Diagnosis not present

## 2021-12-16 DIAGNOSIS — K648 Other hemorrhoids: Secondary | ICD-10-CM | POA: Diagnosis not present

## 2021-12-16 NOTE — Progress Notes (Signed)
[facility] Counselor/Therapist Progress Note  Patient ID: Robin Glover, MRN: [id number],    Date: 12/16/2021  Time Spent: 49 minutes 1101-1150am  Treatment Type: Individual Therapy  Risk Assessment: Danger to Self:  No Self-injurious Behavior: No Danger to Others: No  Subjective: This session was held via video teletherapy due to the coronavirus risk at this time. The patient consented to video teletherapy and was located at her home during this session. She is aware it is the responsibility of the patient to secure confidentiality on her end of the session. The provider was in a private home office for the duration of this session.   The patient arrived late for her webex appointment appearing casualy groomed and easily engaged  Issues addressed: 1-father a-father was hospitalized -pt received call that he was going to be discharged from rehab due to his unwillingness to work b-pt requested that the podiatrist proceed with amputation c-she spent the previous weekend getting him admitted -the podiatrist called on Sunday last -he needs to have amputation from left foot below knee -he also has to have the right two toes removed -the doctor reccommended that they do not remove the limbs but that they let nature take its course d-her father did not want to return him to previous facility -pt only has two options because all other potential locations denied him -pt feels like she is neglecting him because she cannot provide for him -how to challenge herself when she thinks she is failing him -pt is overwhelmed due to trying to work, take care of a teenager, and get her father placed -how to ask siblings and her father's grandchildren to help 2-daughter is overwhelmed -with school -pt doesn't feel she is doing enough to support you or to help with granddaddy -pt is trying to reassure her 3-professional -had to be up overnight due to some work  issues 4-health -has not had the headaches recently -has not pushed the CT scan -learning how to process her emotions and things she can control vs not control 5-past a-pt admits to still holding on to being angry with her father for things he did, said, lied about -pt trying to realize that she does not have to agree with but that is who he is b-pt recalled a recent occasion when he was lying about something -pt asked him what happened in his childhood that caused him to continue lying behavior -he admits that it was from the fear of his mother becoming upset or making her feel disappointed in him -her father said that she reminded him of his mother   -pt then able to recognize that he was protecting himself -pt admits that she is growing towards trying to make peace with her father c-pt goes back to memories and moments going back with her father -pt admits that in her mind she made him to be a bad person -when the memories come up they're always of love, comforting, "I always knew that I could depend and count on him for what I needed" -he was always supportive but did not attend events    Treatment Plan Problems Addressed  Balancing Work and [employer], Caregiving of Aging Parents, Low Self-Esteem/Lack of Assertiveness, Single Parenting  Goals 1. Develop a balance between caregiving responsibilities and other roles, responsibilities, and interests. 2. Develop a realistic perspective regarding demands and obligations of multiple roles and their completion. 3. Develop a support system to assist in coping with stressors of single parenting. 4. Develop  perception of parental caregiving as an opportunity for psychological and personal growth; acknowledge the benefits and the challenges. 5. Develop time management strategies to reduce role overload and role conflicts. 6. Implement self-care strategies to maintain physical and emotional health. Objective Delegate  responsibilities in the home. Target Date: 2022-12-03 Frequency: Biweekly  Progress: 0 Modality: individual  Objective Identify and implement stress management and self-care strategies. Target Date: 2022-12-03 Frequency: Biweekly  Progress: 0 Modality: individual  Related Interventions Encourage the client to employ stress management and self-care activities (e.g., yoga, guided relaxation, exercise, massage, bubble baths). Objective Describe the challenges, demands, and stressors associated with single parenting. Target Date: 2022-12-03 Frequency: Biweekly  Progress: 0 Modality: individual  Related Interventions Validate the client's experiences of frustration, despair, and anxiety associated with single parenting and associated multiple roles. Objective Develop planning and problem-solving skills to more effectively cope with multiple demands. Target Date: 2022-12-03 Frequency: Biweekly  Progress: 0 Modality: individual  Related Interventions Role-play with the client planning and assertive problem-solving skills (e.g., planning and defining the needs for assistance; determining when help is needed, how to ask for help, and whom to ask). Objective Identify feelings and behavior related to attempting to balance work and family roles. Target Date: 2022-12-03 Frequency: Biweekly  Progress: 0 Modality: individual  Related Interventions Explore and identify the client's feelings of guilt, shame, and anxiety associated with not meeting internal or external expectations associated with balancing work and family roles. 7. Increase ability to express needs and desires openly and honestly. 8. Increase assertiveness skills and ability to advocate for self. 9. Increase openness to experiences and opportunities associated with risk-taking. Objective Practice saying "no" to at least one person, declining to comply with a request/favor or identify and assert rights, needs, and wants. Target Date:  2022-12-03 Frequency: Biweekly  Progress: 0 Modality: individual  Objective Identify at least three positive personal attributes. Target Date: 2022-12-03 Frequency: Biweekly  Progress: 0 Modality: individual  Related Interventions Explore the client's fears of being assertive (e.g., fear of rejection, fear of ineffectiveness, fear of ridicule). Explore areas of competency with the client and encourage her to engage in related activities (e.g., dance, singing, arts, volunteer work, joining a book club). Objective Identify three roadblocks to improved self-esteem. Target Date: 2022-12-03 Frequency: Biweekly  Progress: 0 Modality: individual    Diagnosis:Adjustment disorder with mixed anxiety and depressed mood  Plan:  -self-care so you can do what needs to be done for your father. -next appointment will be Monday, December 30, 2021 at 4pm.

## 2021-12-30 ENCOUNTER — Encounter: Payer: Self-pay | Admitting: Professional

## 2021-12-30 ENCOUNTER — Ambulatory Visit (INDEPENDENT_AMBULATORY_CARE_PROVIDER_SITE_OTHER): Payer: BC Managed Care – PPO | Admitting: Professional

## 2021-12-30 DIAGNOSIS — F4323 Adjustment disorder with mixed anxiety and depressed mood: Secondary | ICD-10-CM

## 2021-12-30 NOTE — Progress Notes (Signed)
[facility] Counselor/Therapist Progress Note  Patient ID: Robin Glover, MRN: [id number],    Date: 12/30/2021  Time Spent: 45 minutes [id number]  Treatment Type: Individual Therapy  Risk Assessment: Danger to Self:  No Self-injurious Behavior: No Danger to Others: No  Subjective: This session was held via video teletherapy. The patient consented to video teletherapy and was located at her home during this session. She is aware it is the responsibility of the patient to secure confidentiality on her end of the session. The provider was in a private home office for the duration of this session.   The patient arrived late for her webex appointment appearing casualy groomed and easily engaged.  Issues addressed: 1-father -has worsened -he been refusing to take medication or eat -pt could not see him for 3 days due to her being sick -he was willing to eat and take his meds because she asked him to do so -pt talked with her father about being tired and being ready to go 2-self-care a--self-care so you can do what needs to be done for your father. -taking each day as it comes -Saturday was a difficult day -getting through this hard season 3-professional a-she has been able to stay focused at work b-pt feels she is lacking the support of her manager -related to what she is dealing with at this time c-feels coworkers are scrutinizing her and reports to her boss -she now has Manufacturing engineer Kathlene November has been questioning her about coming and going   -this man has told her she must come in "core hours"   -company will advertise their flexibility but her manager is not being flexible -how to navigate FMLA issues with HR  Treatment Plan Problems Addressed  Balancing Work and [employer], Caregiving of Aging Parents, Low Self-Esteem/Lack of Assertiveness, Single Parenting  Goals 1. Develop a balance between caregiving responsibilities and other roles,  responsibilities, and interests. 2. Develop a realistic perspective regarding demands and obligations of multiple roles and their completion. 3. Develop a support system to assist in coping with stressors of single parenting. 4. Develop perception of parental caregiving as an opportunity for psychological and personal growth; acknowledge the benefits and the challenges. 5. Develop time management strategies to reduce role overload and role conflicts. 6. Implement self-care strategies to maintain physical and emotional health. Objective Delegate responsibilities in the home. Target Date: 2022-12-03 Frequency: Biweekly  Progress: 0 Modality: individual  Objective Identify and implement stress management and self-care strategies. Target Date: 2022-12-03 Frequency: Biweekly  Progress: 0 Modality: individual  Related Interventions Encourage the client to employ stress management and self-care activities (e.g., yoga, guided relaxation, exercise, massage, bubble baths). Objective Describe the challenges, demands, and stressors associated with single parenting. Target Date: 2022-12-03 Frequency: Biweekly  Progress: 0 Modality: individual  Related Interventions Validate the client's experiences of frustration, despair, and anxiety associated with single parenting and associated multiple roles. Objective Develop planning and problem-solving skills to more effectively cope with multiple demands. Target Date: 2022-12-03 Frequency: Biweekly  Progress: 0 Modality: individual  Related Interventions Role-play with the client planning and assertive problem-solving skills (e.g., planning and defining the needs for assistance; determining when help is needed, how to ask for help, and whom to ask). Objective Identify feelings and behavior related to attempting to balance work and family roles. Target Date: 2022-12-03 Frequency: Biweekly  Progress: 0 Modality: individual  Related Interventions Explore  and identify the client's feelings of guilt, shame, and anxiety associated with not meeting internal  or external expectations associated with balancing work and family roles. 7. Increase ability to express needs and desires openly and honestly. 8. Increase assertiveness skills and ability to advocate for self. 9. Increase openness to experiences and opportunities associated with risk-taking. Objective Practice saying "no" to at least one person, declining to comply with a request/favor or identify and assert rights, needs, and wants. Target Date: 2022-12-03 Frequency: Biweekly  Progress: 0 Modality: individual  Objective Identify at least three positive personal attributes. Target Date: 2022-12-03 Frequency: Biweekly  Progress: 0 Modality: individual  Related Interventions Explore the client's fears of being assertive (e.g., fear of rejection, fear of ineffectiveness, fear of ridicule). Explore areas of competency with the client and encourage her to engage in related activities (e.g., dance, singing, arts, volunteer work, joining a book club). Objective Identify three roadblocks to improved self-esteem. Target Date: 2022-12-03 Frequency: Biweekly  Progress: 0 Modality: individual    Diagnosis:Adjustment disorder with mixed anxiety and depressed mood  Plan:  -next appointment will be Wednesday, January 22, 2022 at 12pm.

## 2022-01-22 ENCOUNTER — Encounter: Payer: Self-pay | Admitting: Professional

## 2022-01-22 ENCOUNTER — Ambulatory Visit (INDEPENDENT_AMBULATORY_CARE_PROVIDER_SITE_OTHER): Payer: BC Managed Care – PPO | Admitting: Professional

## 2022-01-22 DIAGNOSIS — F4323 Adjustment disorder with mixed anxiety and depressed mood: Secondary | ICD-10-CM | POA: Diagnosis not present

## 2022-01-22 NOTE — Progress Notes (Signed)
[facility] Counselor/Therapist Progress Note  Patient ID: Robin Glover, MRN: [id number],    Date: 01/22/2022  Time Spent: 24 minutes 1201-1225pm  Treatment Type: Individual Therapy  Risk Assessment: Danger to Self:  No Self-injurious Behavior: No Danger to Others: No  Subjective: This session was held via video teletherapy. The patient consented to video teletherapy and was located at her office during this session. She is aware it is the responsibility of the patient to secure confidentiality on her end of the session. The provider was in a private home office for the duration of this session.   The patient arrived on time for her webex appointment appearing casualy groomed and easily engaged.  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 Develop a balance between caregiving responsibilities and other roles, responsibilities, and interests. 2. Develop a realistic perspective regarding demands and obligations of multiple roles and their completion. 3. Develop a support system to assist in coping with stressors of single parenting. 4. Develop perception of parental caregiving as an opportunity for psychological  and personal growth; acknowledge the benefits and the challenges. 5. Develop time management strategies to reduce role overload and role conflicts. 6. Implement self-care strategies to maintain physical and emotional health. Objective Delegate responsibilities in the home. Target Date: 2022-12-03 Frequency: Biweekly  Progress: 0 Modality: individual  Objective Identify and implement stress management and self-care strategies. Target Date: 2022-12-03 Frequency: Biweekly  Progress: 0 Modality: individual  Related Interventions Encourage the client to employ stress management and self-care activities (e.g., yoga, guided relaxation, exercise, massage, bubble baths). Objective Describe the challenges, demands, and stressors associated with single parenting. Target Date: 2022-12-03 Frequency: Biweekly  Progress: 0 Modality: individual  Related Interventions Validate the client's experiences of frustration, despair, and anxiety associated with single parenting and associated multiple roles. Objective Develop planning and problem-solving skills to more effectively cope with multiple demands. Target Date: 2022-12-03 Frequency: Biweekly  Progress: 0 Modality: individual  Related Interventions Role-play with the client planning and assertive problem-solving skills (e.g., planning and defining the needs for assistance; determining when help is needed, how to ask for help, and whom to ask). Objective Identify feelings and behavior related to attempting to balance work and family roles. Target Date: 2022-12-03 Frequency: Biweekly  Progress: 0 Modality: individual  Related Interventions Explore and identify the client's feelings of guilt, shame, and anxiety associated with not meeting internal or external expectations associated with balancing work and family roles. 7. Increase ability to express needs and desires openly and honestly. 8. Increase assertiveness skills and ability to advocate for  self. 9. Increase openness to experiences and opportunities associated with risk-taking. Objective Practice saying "no" to at least one person, declining to comply with a request/favor or identify and assert rights, needs, and wants. Target Date: 2022-12-03 Frequency:  Biweekly  Progress: 0 Modality: individual  Objective Identify at least three positive personal attributes. Target Date: 2022-12-03 Frequency: Biweekly  Progress: 0 Modality: individual  Related Interventions Explore the client's fears of being assertive (e.g., fear of rejection, fear of ineffectiveness, fear of ridicule). Explore areas of competency with the client and encourage her to engage in related activities (e.g., dance, singing, arts, volunteer work, joining a book club). Objective Identify three roadblocks to improved self-esteem. Target Date: 2022-12-03 Frequency: Biweekly  Progress: 0 Modality: individual    Diagnosis:Adjustment disorder with mixed anxiety and depressed mood  Plan:  -next appointment will be Thursday, February 06, 2022 at 11am.

## 2022-01-29 ENCOUNTER — Other Ambulatory Visit (INDEPENDENT_AMBULATORY_CARE_PROVIDER_SITE_OTHER): Payer: Self-pay | Admitting: Adult Health

## 2022-01-29 DIAGNOSIS — E1169 Type 2 diabetes mellitus with other specified complication: Secondary | ICD-10-CM

## 2022-02-05 ENCOUNTER — Ambulatory Visit: Payer: BC Managed Care – PPO | Admitting: Professional

## 2022-02-06 ENCOUNTER — Ambulatory Visit: Payer: BC Managed Care – PPO | Admitting: Professional

## 2022-02-10 ENCOUNTER — Encounter: Payer: Self-pay | Admitting: Professional

## 2022-02-10 ENCOUNTER — Ambulatory Visit (INDEPENDENT_AMBULATORY_CARE_PROVIDER_SITE_OTHER): Payer: BC Managed Care – PPO | Admitting: Professional

## 2022-02-10 DIAGNOSIS — F4323 Adjustment disorder with mixed anxiety and depressed mood: Secondary | ICD-10-CM

## 2022-02-10 NOTE — Progress Notes (Signed)
[facility] Counselor/Therapist Progress Note  Patient ID: Robin Glover, MRN: [id number],    Date: 02/10/2022  Time Spent:  47 minutes [id number]  Treatment Type: Individual Therapy  Risk Assessment: Danger to Self:  No Self-injurious Behavior: No Danger to Others: No  Subjective: This session was held via video teletherapy. The patient consented to video teletherapy and was located at her car during this session. She is aware it is the responsibility of the patient to secure confidentiality on her end of the session. The provider was in a private home office for the duration of this session.   The patient arrived late for her webex appointment appearing casualy groomed and easily engaged.  Issues addressed: 1-mother a-pt recalls that it has been twenty years since her first breast cancer and mastectomy b-has found a mass on her surviving breast and will be having a biopsy tomorrow and results the 31st -results will be given by pt's friend from church who works for [employer] feels overwhelmed with care giving needs of parents and lack of support from siblings d-pt's mother is focused on living her life e-brothers show up for the moment when something happen and after the crisis they're gone -creating a "we" to care for her mother's needs -use of itemized list to divide responsibilities 2-father a-he is settled and things are going okay -he is still not talking much -he is stabile and his appt at the foot dr went well b-his medicaid has been approved c-he was pretty alert to celebrate his birthday d-pt still working on consistency in visit with father -she worries but is accepting of the ways things are 4-professional a-pt control how she responds to "the narrative" -controls her own thoughts, feelings, and behaviors -she is looking for the positives and laughs a lot b-she is accepting who her coworkers are and not being the "fall person" -she  is setting boundaries for herself and her coworkers  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 Develop a balance between caregiving responsibilities and other roles, responsibilities, and interests. 2. Develop a realistic perspective regarding demands and obligations of multiple roles and their completion. 3. Develop a support system to assist in coping with stressors of single parenting. 4. Develop perception of parental caregiving as an opportunity for psychological and personal growth; acknowledge the benefits and the challenges. 5. Develop time management strategies to reduce role overload and role conflicts. 6. Implement self-care strategies to maintain physical and emotional health. Objective Delegate responsibilities in the home. Target Date: 2022-12-03 Frequency: Biweekly  Progress: 0 Modality: individual  Objective Identify and implement stress management and self-care strategies. Target Date: 2022-12-03 Frequency: Biweekly  Progress: 0 Modality: individual  Related Interventions Encourage the client to employ stress management and self-care activities (e.g., yoga, guided relaxation, exercise, massage, bubble baths). Objective Describe the challenges, demands, and stressors associated with single parenting. Target Date: 2022-12-03 Frequency: Biweekly  Progress: 0 Modality: individual  Related Interventions Validate the client's experiences of frustration, despair, and anxiety associated with single parenting and associated multiple roles. Objective Develop planning and problem-solving skills to more effectively cope with multiple demands. Target Date: 2022-12-03 Frequency: Biweekly  Progress: 0 Modality: individual  Related Interventions Role-play with the client planning and assertive problem-solving skills (e.g., planning and defining the needs  for assistance; determining when help is needed, how to ask for help, and whom to ask). Objective Identify feelings and  behavior related to attempting to balance work and family roles. Target Date: 2022-12-03 Frequency: Biweekly  Progress: 0 Modality: individual  Related Interventions Explore and identify the client's feelings of guilt, shame, and anxiety associated with not meeting internal or external expectations associated with balancing work and family roles. 7. Increase ability to express needs and desires openly and honestly. 8. Increase assertiveness skills and ability to advocate for self. 9. Increase openness to experiences and opportunities associated with risk-taking. Objective Practice saying "no" to at least one person, declining to comply with a request/favor or identify and assert rights, needs, and wants. Target Date: 2022-12-03 Frequency: Biweekly  Progress: 0 Modality: individual  Objective Identify at least three positive personal attributes. Target Date: 2022-12-03 Frequency: Biweekly  Progress: 0 Modality: individual  Related Interventions Explore the client's fears of being assertive (e.g., fear of rejection, fear of ineffectiveness, fear of ridicule). Explore areas of competency with the client and encourage her to engage in related activities (e.g., dance, singing, arts, volunteer work, joining a book club). Objective Identify three roadblocks to improved self-esteem. Target Date: 2022-12-03 Frequency: Biweekly  Progress: 0 Modality: individual    Diagnosis:Adjustment disorder with mixed anxiety and depressed mood  Plan:  -next appointment will be Wednesday, March 19, 2022 at 12pm.

## 2022-02-17 ENCOUNTER — Ambulatory Visit: Payer: BC Managed Care – PPO | Admitting: Professional

## 2022-02-18 ENCOUNTER — Ambulatory Visit: Payer: BC Managed Care – PPO | Admitting: Professional

## 2022-02-19 ENCOUNTER — Ambulatory Visit: Payer: BC Managed Care – PPO | Admitting: Professional

## 2022-02-25 NOTE — Telephone Encounter (Signed)
Error

## 2022-03-05 ENCOUNTER — Ambulatory Visit: Payer: BC Managed Care – PPO | Admitting: Professional

## 2022-03-19 ENCOUNTER — Ambulatory Visit (INDEPENDENT_AMBULATORY_CARE_PROVIDER_SITE_OTHER): Payer: BC Managed Care – PPO | Admitting: Professional

## 2022-03-19 ENCOUNTER — Encounter: Payer: Self-pay | Admitting: Professional

## 2022-03-19 DIAGNOSIS — F4323 Adjustment disorder with mixed anxiety and depressed mood: Secondary | ICD-10-CM

## 2022-03-19 NOTE — Progress Notes (Signed)
[place] Counselor/Therapist Progress Note  Patient ID: Robin Glover, MRN: [id number],    Date: 03/19/2022  Time Spent:  40 minutes 1201-1241pm  Treatment Type: Individual Therapy  Risk Assessment: Danger to Self:  No Self-injurious Behavior: No Danger to Others: No  Subjective: This session was held via video teletherapy. The patient consented to video teletherapy and was located at her car during this session. She is aware it is the responsibility of the patient to secure confidentiality on her end of the session. The provider was in a private home office for the duration of this session.   The patient arrived late for her webex appointment appearing casualy groomed and easily engaged.  Issues addressed: 1-"my dad passed" on Tuesday, the 27th a-"I'm holding on" b-bouts of nervousness and anxiety, not sleeping through the night -with benadryl broken up six hours -without benadryl 2-3 c-his service will be this coming Saturday -good support "as a team" with her brother and sister -pt has ultimate decisions and she has felt some frustrations with them -sister wants to make suggestions but doesn't want to do the work -still trying to get her brother a pass from prison to have visitation with his father d-has been grieving since entering therapy -admits she has been grieving and therefore not as outwardly visible e-hospice was part of care team -pt had in her mind that certain things would occur when he passed -the nursing home did not notify the family and she received a call form hospice nurse expressing condolences -he passed around 415am and they did not tell the family member f-since he moved to public medicaid bed instead of private medicare room -he was behind a curtain and the roommate woke up and asked who was talking and what was going on -feels unreal but as getting closer to service she catches herself getting emotional 2-mother was scheduled  to have radical mastectomy on the same day her father passed -she will be rescheduled  3-professional a-boss accusatory toward patient and caused some negative interactions -both pt and team lead had tempers flair -pt reach out to manage ron the first occasion and he ask to document -she requested conversation with manager to talk with team lead -he told her she is was a "mad woman"   -told her the non-verbal communication displayed that   -told her she had a hard demeanor about her b-seven days later another incident with team lead -she was only trying to ask clarifying questions -she was trying to help team lead and asking another group leader questions   -groups leader questioned why he was speaking with the patient and verbalized that it was not acceptable   -group leader was going to escalate it up c-all people involved have had individual meetings -the following day a conversation was held with team lead -pt is only person he treated poorly and pt was only person on the team that is black -he came around and said if he offended anyone he was sorry; pt was not given the same apology as she was not at her desk   Treatment Plan Problems Addressed  Balancing Work and [employer], Caregiving of Aging Parents, Low Self-Esteem/Lack of Assertiveness, Single Parenting  Goals 1. Develop a balance between caregiving responsibilities and other roles, responsibilities, and interests. 2. Develop a realistic perspective regarding demands and obligations of multiple roles and their completion. 3. Develop a support system to assist in coping with stressors of single parenting. 4. Develop perception of parental  caregiving as an opportunity for psychological and personal growth; acknowledge the benefits and the challenges. 5. Develop time management strategies to reduce role overload and role conflicts. 6. Implement self-care strategies to maintain physical and emotional  health. Objective Delegate responsibilities in the home. Target Date: 2022-12-03 Frequency: Biweekly  Progress: 0 Modality: individual  Objective Identify and implement stress management and self-care strategies. Target Date: 2022-12-03 Frequency: Biweekly  Progress: 0 Modality: individual  Related Interventions Encourage the client to employ stress management and self-care activities (e.g., yoga, guided relaxation, exercise, massage, bubble baths). Objective Describe the challenges, demands, and stressors associated with single parenting. Target Date: 2022-12-03 Frequency: Biweekly  Progress: 0 Modality: individual  Related Interventions Validate the client's experiences of frustration, despair, and anxiety associated with single parenting and associated multiple roles. Objective Develop planning and problem-solving skills to more effectively cope with multiple demands. Target Date: 2022-12-03 Frequency: Biweekly  Progress: 0 Modality: individual  Related Interventions Role-play with the client planning and assertive problem-solving skills (e.g., planning and defining the needs for assistance; determining when help is needed, how to ask for help, and whom to ask). Objective Identify feelings and behavior related to attempting to balance work and family roles. Target Date: 2022-12-03 Frequency: Biweekly  Progress: 0 Modality: individual  Related Interventions Explore and identify the client's feelings of guilt, shame, and anxiety associated with not meeting internal or external expectations associated with balancing work and family roles. 7. Increase ability to express needs and desires openly and honestly. 8. Increase assertiveness skills and ability to advocate for self. 9. Increase openness to experiences and opportunities associated with risk-taking. Objective Practice saying "no" to at least one person, declining to comply with a request/favor or identify and assert rights,  needs, and wants. Target Date: 2022-12-03 Frequency: Biweekly  Progress: 0 Modality: individual  Objective Identify at least three positive personal attributes. Target Date: 2022-12-03 Frequency: Biweekly  Progress: 0 Modality: individual  Related Interventions Explore the client's fears of being assertive (e.g., fear of rejection, fear of ineffectiveness, fear of ridicule). Explore areas of competency with the client and encourage her to engage in related activities (e.g., dance, singing, arts, volunteer work, joining a book club). Objective Identify three roadblocks to improved self-esteem. Target Date: 2022-12-03 Frequency: Biweekly  Progress: 0 Modality: individual    Diagnosis:Adjustment disorder with mixed anxiety and depressed mood  Plan:  -next appointment will be Wednesday, April 02, 2022 at 12pm.

## 2022-04-02 ENCOUNTER — Ambulatory Visit (INDEPENDENT_AMBULATORY_CARE_PROVIDER_SITE_OTHER): Payer: BC Managed Care – PPO | Admitting: Professional

## 2022-04-02 ENCOUNTER — Encounter: Payer: Self-pay | Admitting: Professional

## 2022-04-02 DIAGNOSIS — F4323 Adjustment disorder with mixed anxiety and depressed mood: Secondary | ICD-10-CM | POA: Diagnosis not present

## 2022-04-02 NOTE — Progress Notes (Signed)
[place] Counselor/Therapist Progress Note  Patient ID: Robin Glover, MRN: [id number],    Date: 04/02/2022  Time Spent:  42 minutes 1202-1244pm  Treatment Type: Individual Therapy  Risk Assessment: Danger to Self:  No Self-injurious Behavior: No Danger to Others: No  Subjective: This session was held via video teletherapy. The patient consented to video teletherapy and was located at her car during this session. She is aware it is the responsibility of the patient to secure confidentiality on her end of the session. The provider was in a private home office for the duration of this session.   The patient arrived on time for her webex appointment appearing casualy groomed and easily engaged.  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 2-coping with grief a-work on fact that he doesn't have to suffer anymore b-she can do other things with her time instead of having to worry about taking care of him c-become more active-take a walk, join an exercise class to release the good endorphins d-"I don't want to be stuck here, cause this place if it becomes what I think about or reflect on could cause me to become health issues" -talking to her dad e-pt admits she has a lot of living to do, "I've put me on hold for so many years" -pt admits to finding what she really loves again, discovering new things -pt admits cleaning out her home f-she has began clearing her father's  boxes -deciding what to do with his belongings -clothes, hats, medical supplies, his class ring g-a friend had a canvas made of her and her father with a poem h-her brother did get approved for a private viewing of her father -pt "camped out" at funeral home to wait to see her brother arrive from prison -pt was able to see him and she went in with her brother i-sister asked her mom about father's insurance policy -pt was peeved because she has not hidden anything from her siblings -at family dinner Sunday, her sister did not have too much to say -her other brother checks in with pt and vice versa j-pt decided today that she is going to text her siblings that she loves them -she decided she is not going to make a big deal out of it and will address only if she asks k-does have some concerns for her daughter -daughter's demeanor has been sad -pt has offered her counseling 3-professional -things a little better but not that much -he has not been aggressive -at their 1:1 last week and there are still some incorrect assumptions he has made about the pt -pt accepted his feedback and is trying to steer clear of him -when he asks questions she is answering to the best of her ability or refer him to someone that has the answers 4-self-awareness -found herself dealing with relationship, the love bond that we had -pt admits it would have been easier if she had a significant other to go  through this or share her grief -realization that her dad was a significant female in her life that really loved her without question -she expresses concern that she can find someone who will cherish her -she does not want to burden or worry her daughter but want to find a life partner -pt sees herself as trying to prepare  Treatment Plan Problems Addressed  Balancing Work and [employer], Caregiving of Aging Parents, Low Self-Esteem/Lack of Assertiveness, Single Parenting  Goals 1. Develop a balance  between caregiving responsibilities and other roles, responsibilities, and interests. 2. Develop a realistic perspective regarding demands and obligations of multiple roles and their completion. 3. Develop a support system to assist in coping with stressors of single parenting. 4. Develop perception of parental caregiving as an opportunity for psychological and personal growth; acknowledge the benefits and the challenges. 5. Develop time management strategies to reduce role overload and role conflicts. 6. Implement self-care strategies to maintain physical and emotional health. Objective Delegate responsibilities in the home. Target Date: 2022-12-03 Frequency: Biweekly  Progress: 0 Modality: individual  Objective Identify and implement stress management and self-care strategies. Target Date: 2022-12-03 Frequency: Biweekly  Progress: 0 Modality: individual  Related Interventions Encourage the client to employ stress management and self-care activities (e.g., yoga, guided relaxation, exercise, massage, bubble baths). Objective Describe the challenges, demands, and stressors associated with single parenting. Target Date: 2022-12-03 Frequency: Biweekly  Progress: 0 Modality: individual  Related Interventions Validate the client's experiences of frustration, despair, and anxiety associated with single parenting and associated multiple roles. Objective Develop planning and problem-solving skills to more effectively cope with multiple demands. Target Date: 2022-12-03 Frequency: Biweekly  Progress: 0 Modality: individual  Related Interventions Role-play with the client planning and assertive problem-solving skills (e.g., planning and defining the needs for assistance; determining when help is needed, how to ask for help, and whom to ask). Objective Identify feelings and behavior related to attempting to balance work and family roles. Target Date: 2022-12-03 Frequency: Biweekly  Progress: 0  Modality: individual  Related Interventions Explore and identify the client's feelings of guilt, shame, and anxiety associated with not meeting internal or external expectations associated with balancing work and family roles. 7. Increase ability to express needs and desires openly and honestly. 8. Increase assertiveness skills and ability to advocate for self. 9. Increase openness to experiences and opportunities associated with risk-taking. Objective Practice saying "no" to at least one person, declining to comply with a request/favor or identify and assert rights, needs, and wants. Target Date: 2022-12-03 Frequency: Biweekly  Progress: 0 Modality: individual  Objective Identify at least three positive personal attributes. Target Date: 2022-12-03 Frequency: Biweekly  Progress: 0 Modality: individual  Related Interventions Explore the client's fears of being assertive (e.g., fear of rejection, fear of ineffectiveness, fear of ridicule). Explore areas of competency with the client and encourage her to engage in related activities (e.g., dance, singing, arts, volunteer work, joining a book club). Objective Identify three roadblocks to improved self-esteem. Target Date: 2022-12-03 Frequency: Biweekly  Progress: 0 Modality: individual    Diagnosis:Adjustment disorder with mixed anxiety and depressed mood  Plan:  -next appointment will be Wednesday, April 16, 2022 at 12pm.

## 2022-04-16 ENCOUNTER — Ambulatory Visit (INDEPENDENT_AMBULATORY_CARE_PROVIDER_SITE_OTHER): Payer: BC Managed Care – PPO | Admitting: Professional

## 2022-04-16 ENCOUNTER — Encounter: Payer: Self-pay | Admitting: Professional

## 2022-04-16 DIAGNOSIS — F4323 Adjustment disorder with mixed anxiety and depressed mood: Secondary | ICD-10-CM

## 2022-04-16 NOTE — Progress Notes (Signed)
[place] Counselor/Therapist Progress Note  Patient ID: Robin Glover, MRN: [id number],    Date: 04/16/2022  Time Spent:  49 minutes 1202-1251pm  Treatment Type: Individual Therapy  Risk Assessment: Danger to Self:  No Self-injurious Behavior: No Danger to Others: No  Subjective: This session was held via video teletherapy. The patient consented to video teletherapy and was located at her car during this session. She is aware it is the responsibility of the patient to secure confidentiality on her end of the session. The provider was in a private home office for the duration of this session.   The patient arrived on time for her webex appointment appearing casualy groomed and easily engaged.  Issues addressed: 1-professional -pt doesn't know how long she can deal with the group leader -there are incidents that happen every week -there is nothing she can say that does not offend him -pt says that he has issues with her but he does not have any issues with her   -pt will not permit him to raise her voice or speak rudely to him   -pt reports HR is not involved and her Mudlogger (from same country as her group leader) and Freight forwarder are not doing enough to resolve the problem -pt has heard that the director has interacted the same as her team leader   -both men are algerian Muslims     -pt's previous spouse was algerian and she does notice similarities -yesterday they were having a normal conversation until he asked about something and he said why are you looking at me like that, he got up and stormed out of the lab   -the best response is no response   -boss filed a Tourist information centre manager on the 25th for being disrespectful and raising her voice   -on 28th there was a meeting with her manager and HR and was instructed to only giving him what he wants and not elaborate -pt sent the Investment banker, operational an email about her dissatisfaction that things were not resolved   -she  provided update on what had happened yesterday because she want to give an opportunity to satisfy the situation   -she does not want to escalate it to VP and P of company -pt is surprised that they did not manage this after the 3 or 4th incident and it is increasing   -pt has requested a meeting with the leadership and her boss to hash out   -pt received response stating his willingness to have a meeting     -pt wanted to have her manager to be there so that her everyone hears the same thing -her coworkers are willing to speak up for her and another manager spoke to her and told her that her coworkers are willing -discussed potential barriers to include cultural differences, religious differences, sexual differences, or class differences -pt equates her work issues to being of a greater struggle than dealing with her father's deterioration  Treatment Plan Problems Addressed  Balancing Work and [employer], Caregiving of Aging Parents, Low Self-Esteem/Lack of Assertiveness, Single Parenting  Goals 1. Develop a balance between caregiving responsibilities and other roles, responsibilities, and interests. 2. Develop a realistic perspective regarding demands and obligations of multiple roles and their completion. 3. Develop a support system to assist in coping with stressors of single parenting. 4. Develop perception of parental caregiving as an opportunity for psychological and personal growth; acknowledge the benefits and the challenges. 5. Develop time management strategies to reduce role  overload and role conflicts. 6. Implement self-care strategies to maintain physical and emotional health. Objective Delegate responsibilities in the home. Target Date: 2022-12-03 Frequency: Biweekly  Progress: 0 Modality: individual  Objective Identify and implement stress management and self-care strategies. Target Date: 2022-12-03 Frequency: Biweekly  Progress: 0 Modality: individual  Related  Interventions Encourage the client to employ stress management and self-care activities (e.g., yoga, guided relaxation, exercise, massage, bubble baths). Objective Describe the challenges, demands, and stressors associated with single parenting. Target Date: 2022-12-03 Frequency: Biweekly  Progress: 0 Modality: individual  Related Interventions Validate the client's experiences of frustration, despair, and anxiety associated with single parenting and associated multiple roles. Objective Develop planning and problem-solving skills to more effectively cope with multiple demands. Target Date: 2022-12-03 Frequency: Biweekly  Progress: 0 Modality: individual  Related Interventions Role-play with the client planning and assertive problem-solving skills (e.g., planning and defining the needs for assistance; determining when help is needed, how to ask for help, and whom to ask). Objective Identify feelings and behavior related to attempting to balance work and family roles. Target Date: 2022-12-03 Frequency: Biweekly  Progress: 0 Modality: individual  Related Interventions Explore and identify the client's feelings of guilt, shame, and anxiety associated with not meeting internal or external expectations associated with balancing work and family roles. 7. Increase ability to express needs and desires openly and honestly. 8. Increase assertiveness skills and ability to advocate for self. 9. Increase openness to experiences and opportunities associated with risk-taking. Objective Practice saying "no" to at least one person, declining to comply with a request/favor or identify and assert rights, needs, and wants. Target Date: 2022-12-03 Frequency: Biweekly  Progress: 0 Modality: individual  Objective Identify at least three positive personal attributes. Target Date: 2022-12-03 Frequency: Biweekly  Progress: 0 Modality: individual  Related Interventions Explore the client's fears of being  assertive (e.g., fear of rejection, fear of ineffectiveness, fear of ridicule). Explore areas of competency with the client and encourage her to engage in related activities (e.g., dance, singing, arts, volunteer work, joining a book club). Objective Identify three roadblocks to improved self-esteem. Target Date: 2022-12-03 Frequency: Biweekly  Progress: 0 Modality: individual    Diagnosis:Adjustment disorder with mixed anxiety and depressed mood  Plan:  -resolve conflict with team leader -next appointment will be Wednesday, April 30, 2022 at 12pm.

## 2022-04-30 ENCOUNTER — Encounter: Payer: Self-pay | Admitting: Professional

## 2022-04-30 ENCOUNTER — Ambulatory Visit (INDEPENDENT_AMBULATORY_CARE_PROVIDER_SITE_OTHER): Payer: BC Managed Care – PPO | Admitting: Professional

## 2022-04-30 DIAGNOSIS — F4323 Adjustment disorder with mixed anxiety and depressed mood: Secondary | ICD-10-CM

## 2022-04-30 NOTE — Progress Notes (Signed)
[facility] Counselor/Therapist Progress Note  Patient ID: Robin Glover, MRN: [id number],    Date: 04/30/2022  Time Spent:  42 minutes 1201-1243pm  Treatment Type: Individual Therapy  Risk Assessment: Danger to Self:  No Self-injurious Behavior: No Danger to Others: No  Subjective: This session was held via video teletherapy. The patient consented to video teletherapy and was located a conference room at work during this session. She is aware it is the responsibility of the patient to secure confidentiality on her end of the session. The provider was in a private home office for the duration of this session.   The patient arrived on time for her Caregility appointment appearing casualy groomed and easily engaged.  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 [place] -it happened so fast -she did not look for this, a recruiter found her on Linkedin -she did brief phone interview last Tuesday with the recruiter -really good conversation for the patient b-had to get a resume completed and did so by the following day -pt was second-guessing herself -she was interviewed by company last Friday morning -recruiter prepped her for the interview with questions -company owner interviewed her and was offered position this past Monday -May 6th is her start date c-pt turned her notice in yesterday and today will have a meeting with the manager -had mediation meeting last Thursday and did not go over like she had hoped -pt sat back and listen to what her team leader Ishmael had to say -her boss and other leadership saw his reactions to her -she had a closing statement and when she was stating what she needed and wanted the team lead said that he was ordering her around -she thinks that these difficulties were God-ordained -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 -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 the  quality aspects of the company -she will be working onsite FT -she gave herself a week off between jobs d-she felt somewhat validated -she admits that the director stopped him and told him that he was being disrespectful -team lead's main claim was that she didn't listen to or respect him -team lead really showed lack of control and maturity e-she did send her notice to her team lead and her manager -manager said he was sad the she was leaving and that they would meet to discuss her transition -pt was able to identify what she might do differently if given similar situation again -pt states she would not take home with her or make it personal -pt received a significant pay increase for the new job and will have two weeks vacation and one week of sick leave -she will need to be more flexible with her work days to manage tasks and she does not mind 2-personal a-Monday night-Tuesday morning she always wakes up and she thinks that is when she is transitioning -she feels heaviness and disruption due to when she got that call -this past Monday-Tuesday she did not have the all-consuming grief -this Tuesday at work was different in her grieving process b-several years ago talking with her father he told her he dreamed about her and that she had a bi office and was in charge and that she was the boss -pt was reminded of this when she received the call for the initial interview -while meditating and praying she was reflecting that her current job is not good for her MH and well-being c-pt  could hear her daddy say "it is alright baby"; pt felt the assurance that things were going to work out -pt feels really proud of herself in that work meeting because of the assurances form her father -pt kept hearing be quiet, listen and observe -mom's breast cancer -they removed a mass and are looking at reoccurrence testing -it is still classified as breast cancer but it could show up somewhere else -no  radiation as lymph nodes were negative -awaiting to see if she will required chemo based on results -she is trying to navigate what to do with the house -sister is trying to "rope her in" to opening another account to save the remaining money to her mother when her mother was the one who left their father holding all the responsibility -insurance money came back from father -sister was questioning her mother who knew nothing   -pt feels her sister was questioning the patient telling the truth -pt was completely shocked, she didn't think her sister would be concerned d-pt asked her mother to get all of her affairs in order with living will and POA -she told her mother she would do medical but will not do financial  Treatment Plan Problems Addressed  Balancing Work and [employer], Caregiving of Aging Parents, Low Self-Esteem/Lack of Assertiveness, Single Parenting  Goals 1. Develop a balance between caregiving responsibilities and other roles, responsibilities, and interests. 2. Develop a realistic perspective regarding demands and obligations of multiple roles and their completion. 3. Develop a support system to assist in coping with stressors of single parenting. 4. Develop perception of parental caregiving as an opportunity for psychological and personal growth; acknowledge the benefits and the challenges. 5. Develop time management strategies to reduce role overload and role conflicts. 6. Implement self-care strategies to maintain physical and emotional health. Objective Delegate responsibilities in the home. Target Date: 2022-12-03 Frequency: Biweekly  Progress: 0 Modality: individual  Objective Identify and implement stress management and self-care strategies. Target Date: 2022-12-03 Frequency: Biweekly  Progress: 0 Modality: individual  Related Interventions Encourage the client to employ stress management and self-care activities (e.g., yoga, guided relaxation,  exercise, massage, bubble baths). Objective Describe the challenges, demands, and stressors associated with single parenting. Target Date: 2022-12-03 Frequency: Biweekly  Progress: 0 Modality: individual  Related Interventions Validate the client's experiences of frustration, despair, and anxiety associated with single parenting and associated multiple roles. Objective Develop planning and problem-solving skills to more effectively cope with multiple demands. Target Date: 2022-12-03 Frequency: Biweekly  Progress: 0 Modality: individual  Related Interventions Role-play with the client planning and assertive problem-solving skills (e.g., planning and defining the needs for assistance; determining when help is needed, how to ask for help, and whom to ask). Objective Identify feelings and behavior related to attempting to balance work and family roles. Target Date: 2022-12-03 Frequency: Biweekly  Progress: 0 Modality: individual  Related Interventions Explore and identify the client's feelings of guilt, shame, and anxiety associated with not meeting internal or external expectations associated with balancing work and family roles. 7. Increase ability to express needs and desires openly and honestly. 8. Increase assertiveness skills and ability to advocate for self. 9. Increase openness to experiences and opportunities associated with risk-taking. Objective Practice saying "no" to at least one person, declining to comply with a request/favor or identify and assert rights, needs, and wants. Target Date: 2022-12-03 Frequency: Biweekly  Progress: 0 Modality: individual  Objective Identify at least three positive personal attributes. Target Date: 2022-12-03 Frequency: Biweekly  Progress:  0 Modality: individual  Related Interventions Explore the client's fears of being assertive (e.g., fear of rejection, fear of ineffectiveness, fear of ridicule). Explore areas of competency with the client and  encourage her to engage in related activities (e.g., dance, singing, arts, volunteer work, joining a book club). Objective Identify three roadblocks to improved self-esteem. Target Date: 2022-12-03 Frequency: Biweekly  Progress: 0 Modality: individual    Diagnosis:Adjustment disorder with mixed anxiety and depressed mood  Plan:  -pt will call once her the healthcare benefits for her new job begin.

## 2022-05-14 ENCOUNTER — Ambulatory Visit: Payer: BC Managed Care – PPO | Admitting: Professional

## 2022-05-26 ENCOUNTER — Ambulatory Visit: Payer: Self-pay | Admitting: Professional

## 2022-06-07 IMAGING — DX DG HAND COMPLETE 3+V*L*
1 series · 3 of 3 positions shown · non-contrast
Comparison: None.

CLINICAL DATA: Thumb injury

EXAM:
LEFT HAND - COMPLETE 3+ VIEW

[Series 1: hand · 0.14mm/px · 3 of 3 slices shown]
[im 1/3]
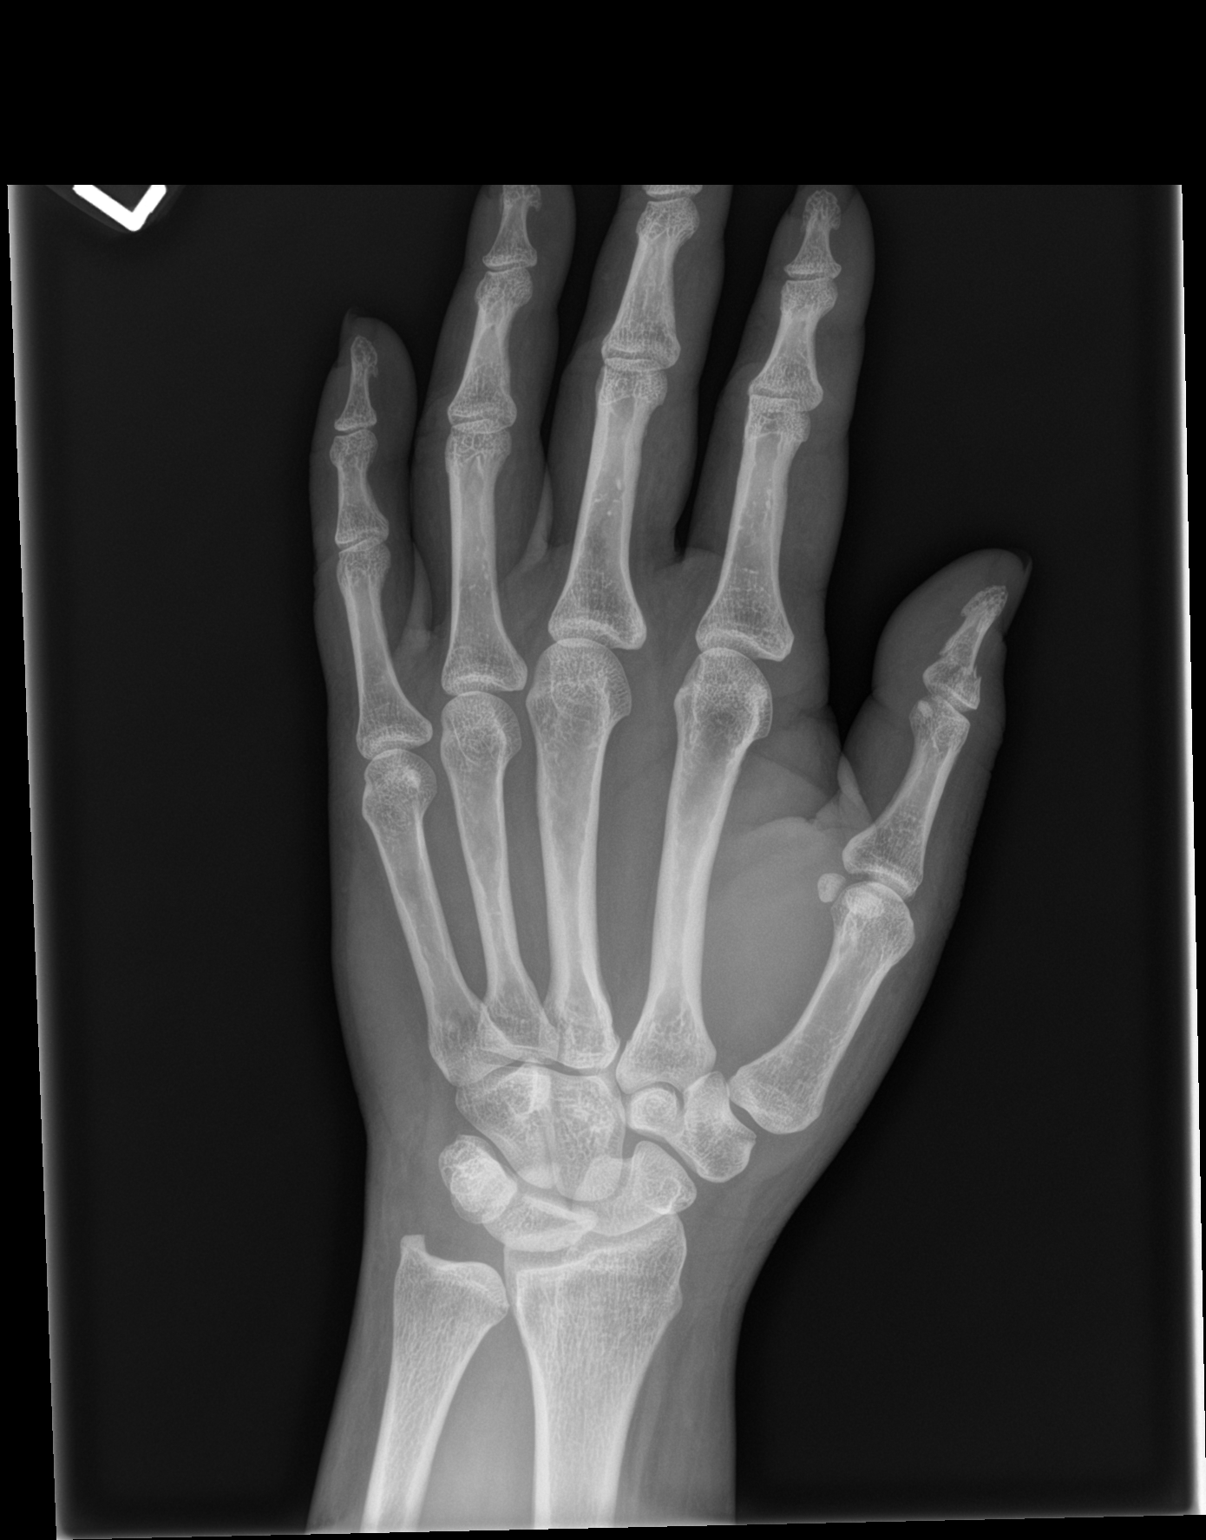
[im 2/3]
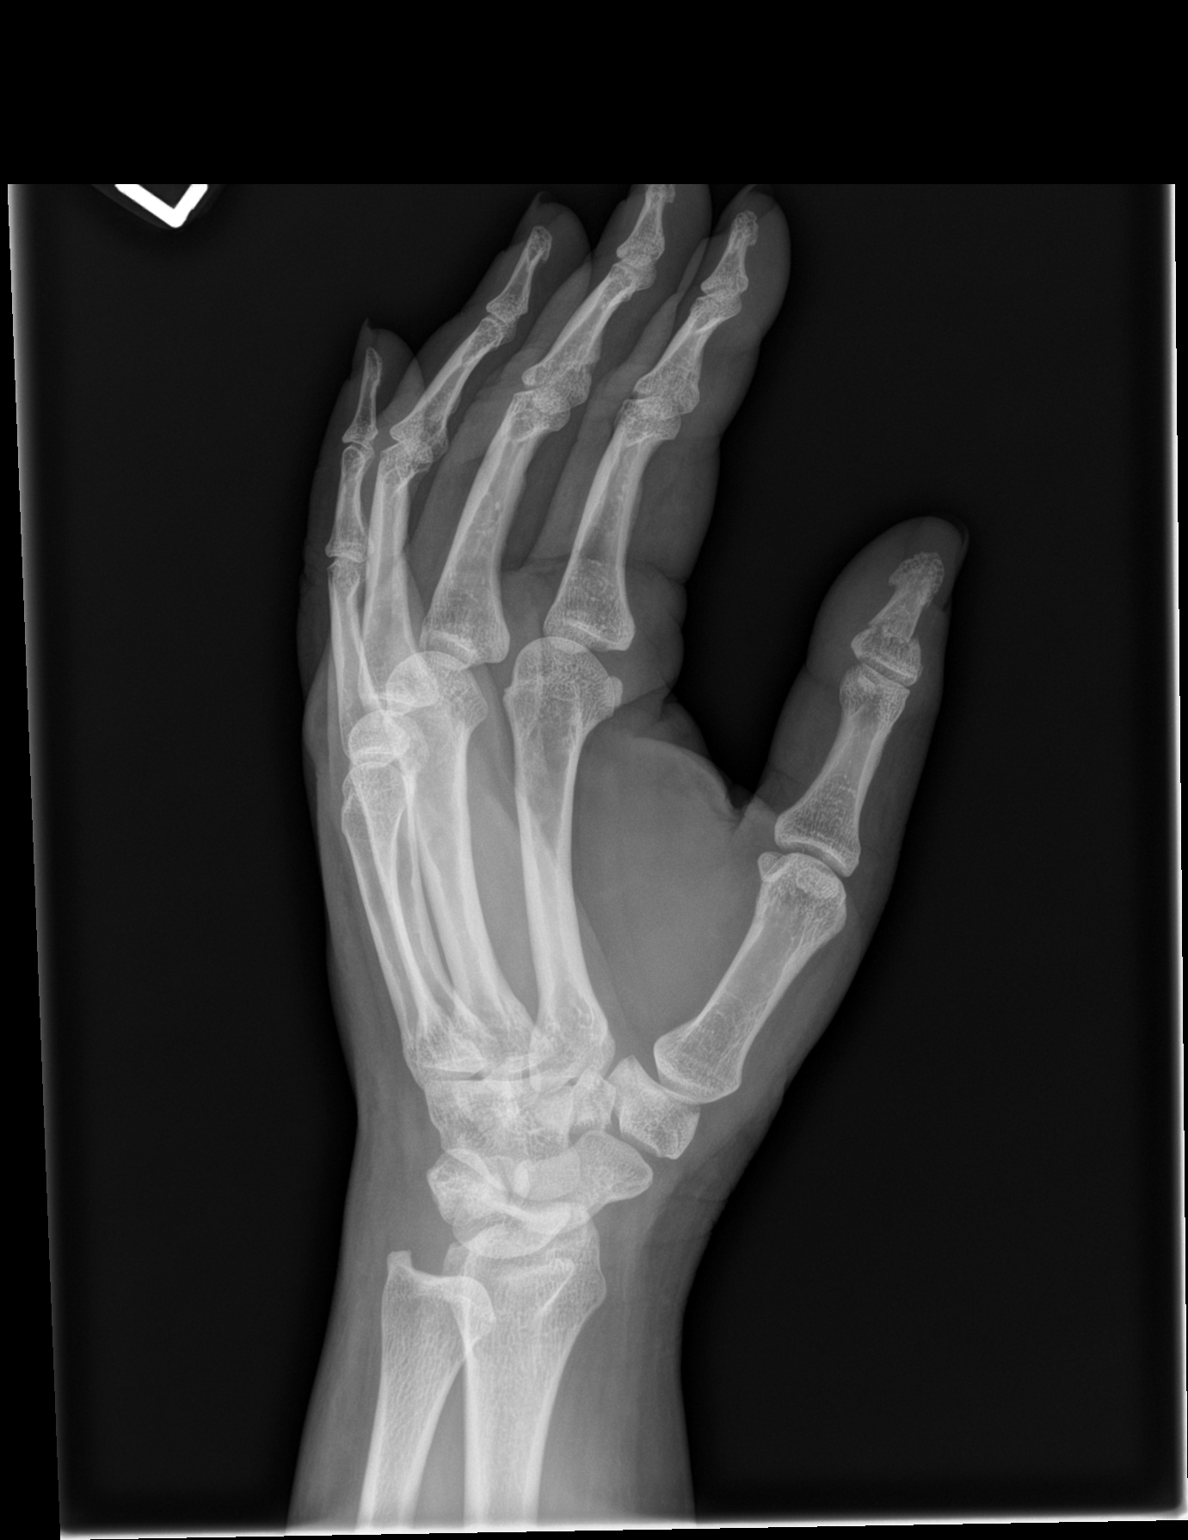
[im 3/3]
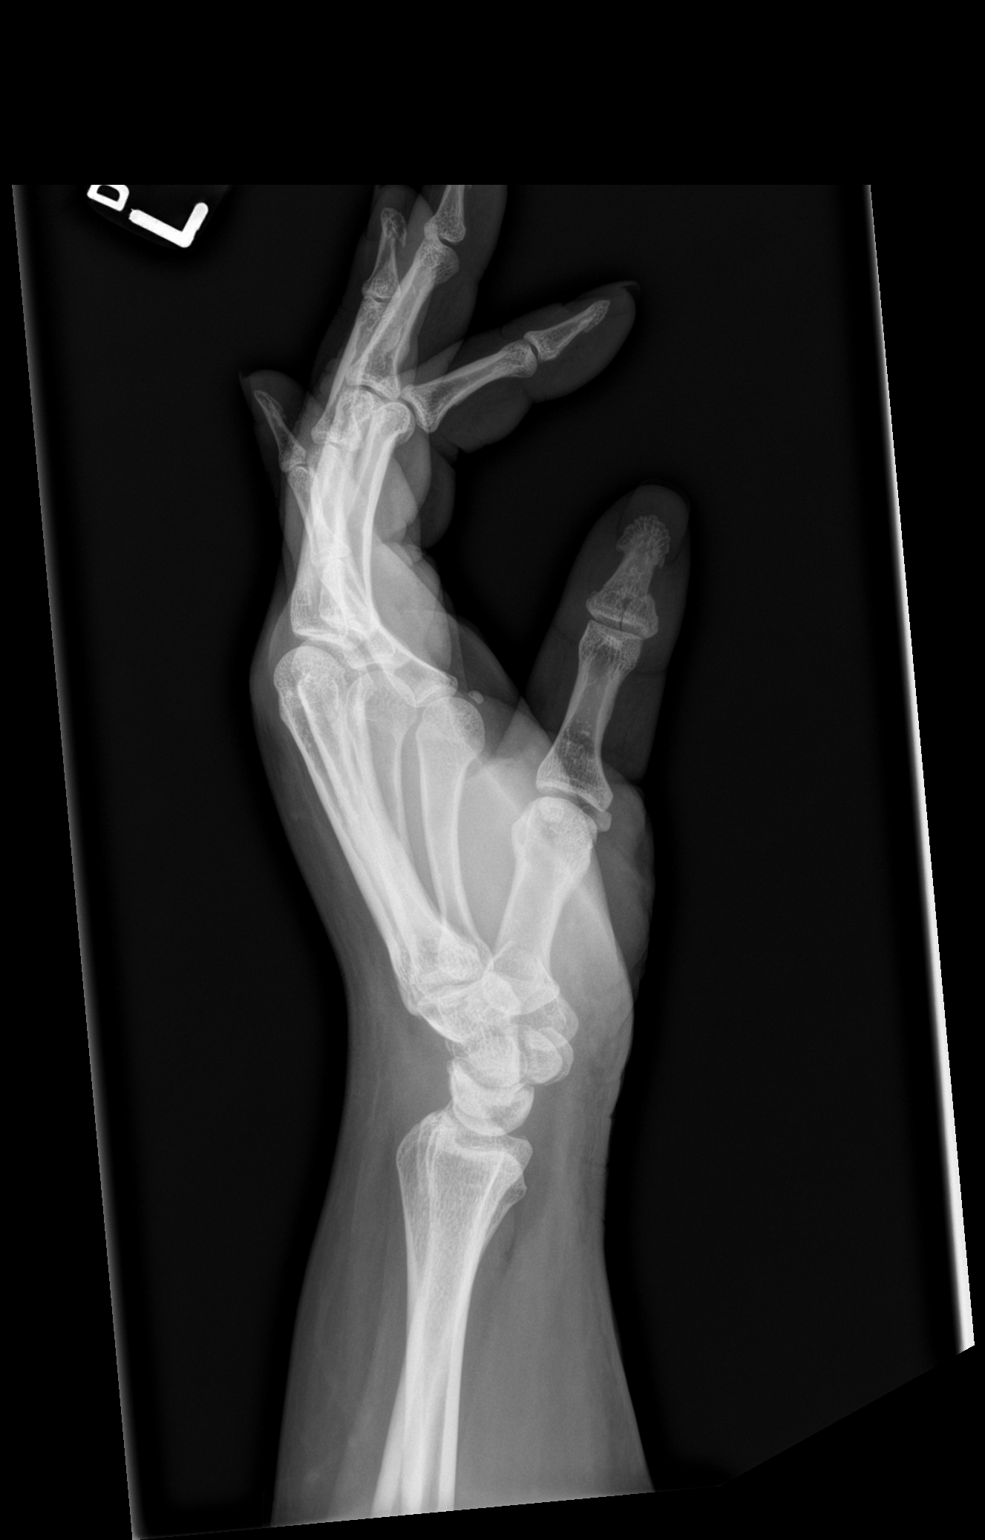

[3 of 3 positions shown; findings below may reference images not displayed]

FINDINGS: Comminuted fracture involving the proximal aspect of the 1st distal
phalanx, with dominant transverse component but with vertical
intra-articular extension. This is essentially nondisplaced.
Associated soft tissue swelling.
IMPRESSION: Comminuted intra-articular fracture involving the 1st distal
phalanx, as above.

## 2022-06-11 ENCOUNTER — Ambulatory Visit: Payer: Self-pay | Admitting: Professional

## 2022-10-15 ENCOUNTER — Ambulatory Visit: Payer: Self-pay | Admitting: Nurse Practitioner

## 2022-10-20 ENCOUNTER — Ambulatory Visit (INDEPENDENT_AMBULATORY_CARE_PROVIDER_SITE_OTHER): Payer: 59 | Admitting: Professional

## 2022-10-20 ENCOUNTER — Encounter: Payer: Self-pay | Admitting: Professional

## 2022-10-20 DIAGNOSIS — F4323 Adjustment disorder with mixed anxiety and depressed mood: Secondary | ICD-10-CM

## 2022-10-20 NOTE — Progress Notes (Signed)
[facility] Counselor/Therapist Progress Note  Patient ID: Robin Glover, MRN: [id number],    Date: 10/20/2022  Time Spent:  54 minutes 103-157pm  Treatment Type: Individual Therapy  Risk Assessment: Danger to Self:  No Self-injurious Behavior: No Danger to Others: No  Subjective: This session was held via video teletherapy. The patient consented to video teletherapy and was located a conference room at work during this session. She is aware it is the responsibility of the patient to secure confidentiality on her end of the session. The provider was in a private home office for the duration of this session.   The patient arrived on time for her Caregility appointment appearing casualy groomed and easily engaged.  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 and she is more comfortable in structure -her team still consult the former Academic librarian and he  -the technician that keeps going to the old Production designer, theatre/television/film does not follow the chain of command -she is giving herself until December to decide if she stays or looks for another position -pt felt this job was the best decision to get away from her former colleague -"it's not a bad job, things are just too loose for me" 2-financial -since father has passed and parents were both on the deed and did not divorce the home is 50% his descendants -pt handed the deed with all bill paid to her mother -her mother is waffling about where she will live -an attorney has been calling the pt so she can sell the house -dealing with the money and the home and having to deal wit her mother "harassing" her about the house -has been taxing and she is not interested in doing so anymore -her brother in prison when released could live their once released she considered hi and his wife leaving there -pt's mother is being critical and makes remarks about how they did not do enough for her  father -her mother has entered her home while she was away -her mother does not like that she has less control -her daughter did not want to ride with "granny" to school because she's always complaining -pt admits not selling the home causes her mom to be irritated and she likes that -pt admits going into the home is pretty emotional and expects to see him -pt becomes tearful when talking about her father and missing him -problem solving and making cognitive vs emotional decisions  Treatment Plan Problems Addressed  Balancing Work and [employer], Caregiving of Aging Parents, Low Self-Esteem/Lack of Assertiveness, Single Parenting  Goals 1. Develop a balance between caregiving responsibilities and other roles, responsibilities, and interests. 2. Develop a realistic perspective regarding demands and obligations of multiple roles and their completion. 3. Develop a support system to assist in coping with stressors of single parenting. 4. Develop perception of parental caregiving as an opportunity for psychological and personal growth; acknowledge the benefits and the challenges. 5. Develop time management strategies to reduce role overload and role conflicts. 6. Implement self-care strategies to maintain physical and emotional health. Objective Delegate responsibilities in the home. Target Date: 2022-12-03 Frequency: Biweekly  Progress: 0 Modality: individual  Objective Identify and implement stress management and self-care strategies. Target Date: 2022-12-03 Frequency: Biweekly  Progress: 0 Modality: individual  Related Interventions Encourage the client to employ stress management and self-care activities (e.g., yoga, guided relaxation, exercise, massage, bubble baths). Objective Describe the challenges, demands, and stressors associated with single parenting. Target Date: 2022-12-03  Frequency: Biweekly  Progress: 0 Modality: individual  Related Interventions Validate the  client's experiences of frustration, despair, and anxiety associated with single parenting and associated multiple roles. Objective Develop planning and problem-solving skills to more effectively cope with multiple demands. Target Date: 2022-12-03 Frequency: Biweekly  Progress: 0 Modality: individual  Related Interventions Role-play with the client planning and assertive problem-solving skills (e.g., planning and defining the needs for assistance; determining when help is needed, how to ask for help, and whom to ask). Objective Identify feelings and behavior related to attempting to balance work and family roles. Target Date: 2022-12-03 Frequency: Biweekly  Progress: 0 Modality: individual  Related Interventions Explore and identify the client's feelings of guilt, shame, and anxiety associated with not meeting internal or external expectations associated with balancing work and family roles. 7. Increase ability to express needs and desires openly and honestly. 8. Increase assertiveness skills and ability to advocate for self. 9. Increase openness to experiences and opportunities associated with risk-taking. Objective Practice saying "no" to at least one person, declining to comply with a request/favor or identify and assert rights, needs, and wants. Target Date: 2022-12-03 Frequency: Biweekly  Progress: 0 Modality: individual  Objective Identify at least three positive personal attributes. Target Date: 2022-12-03 Frequency: Biweekly  Progress: 0 Modality: individual  Related Interventions Explore the client's fears of being assertive (e.g., fear of rejection, fear of ineffectiveness, fear of ridicule). Explore areas of competency with the client and encourage her to engage in related activities (e.g., dance, singing, arts, volunteer work, joining a book club). Objective Identify three roadblocks to improved self-esteem. Target Date: 2022-12-03 Frequency: Biweekly  Progress: 0 Modality:  individual    Diagnosis:Adjustment disorder with mixed anxiety and depressed mood  Plan:  -meet every two weeks to assist patient in managing her grief, family conflict, and work stressors -next session will be on Monday, October 28,2024 at [facility]

## 2022-11-03 ENCOUNTER — Other Ambulatory Visit: Payer: Self-pay

## 2022-11-03 ENCOUNTER — Ambulatory Visit (INDEPENDENT_AMBULATORY_CARE_PROVIDER_SITE_OTHER): Payer: Managed Care, Other (non HMO) | Admitting: Nurse Practitioner

## 2022-11-03 ENCOUNTER — Encounter: Payer: Self-pay | Admitting: Nurse Practitioner

## 2022-11-03 VITALS — BP 120/82 | HR 88 | Ht 66.0 in | Wt 254.0 lb

## 2022-11-03 DIAGNOSIS — T8332XA Displacement of intrauterine contraceptive device, initial encounter: Secondary | ICD-10-CM

## 2022-11-03 DIAGNOSIS — Z113 Encounter for screening for infections with a predominantly sexual mode of transmission: Secondary | ICD-10-CM

## 2022-11-03 DIAGNOSIS — Z01419 Encounter for gynecological examination (general) (routine) without abnormal findings: Secondary | ICD-10-CM

## 2022-11-03 NOTE — Progress Notes (Signed)
Robin Glover [birth year] [id number]   History:  47 y.o. G1P1001 presents for annual exam. Mirena IUD 07/2021, occasional light spotting. 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 Normal mammogram history. DM, HTN, HLD, vitamin D deficiency managed by PCP.   Gynecologic History Patient's last menstrual period was 10/28/2022 (approximate).   Contraception/Family planning: IUD Sexually active: Yes, would like STD screening  Health Maintenance Last Pap: 05/06/2021 Results were: Normal neg HPV, 5-year repeat Last mammogram: 11/08/2021. Results were: Normal Last colonoscopy: Not indicated Last Dexa: Not indicated  Past medical history, past surgical history, family history and social history were all reviewed and documented in the EPIC chart. 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 Mother with history of breast cancer at age 58 and again recently.   ROS:  A ROS was performed and pertinent positives and negatives are included.  Exam:  Vitals:   11/03/22 1418  BP: 120/82  Pulse: 88  SpO2: 98%  Weight: 254 lb (115.2 kg)  Height: 5\' 6"  (1.676 m)     Body mass index is 41 kg/m.  General appearance:  Normal Thyroid:  Symmetrical, normal in size, without palpable masses or nodularity. Respiratory  Auscultation:  Clear without wheezing or rhonchi Cardiovascular  Auscultation:  Regular rate, without rubs, murmurs or gallops  Edema/varicosities:  Not grossly evident Abdominal  Soft,nontender, without masses, guarding or rebound.  Liver/spleen:  No organomegaly noted  Hernia:  None appreciated  Skin  Inspection:  Grossly normal Breasts: Examined lying and sitting.   Right: Without masses, retractions, nipple discharge or axillary adenopathy.   Left: Without masses, retractions, nipple discharge or axillary adenopathy. Pelvic: External genitalia:  no lesions              Urethra:  normal appearing urethra with no masses, tenderness or lesions               Bartholins and Skenes: normal                 Vagina: normal appearing vagina with normal color and discharge, no lesions              Cervix: no lesions. IUD string not visible, unable to tease out with cytobrush Bimanual Exam:  Uterus:  no masses or tenderness              Adnexa: no mass, fullness, tenderness              Rectovaginal: Deferred              Anus:  normal, no lesions   Patient informed chaperone available to be present for breast and pelvic exam. Patient has requested no chaperone to be present. Patient has been advised what will be completed during breast and pelvic exam.   Assessment/Plan:  47 y.o. G1P1001 for annual exam.   Well female exam with routine gynecological exam - Education provided on SBEs, importance of preventative screenings, current guidelines, high calcium diet, regular exercise, and multivitamin daily.  Labs with PCP.   Screening examination for STD (sexually transmitted disease) - Plan: SURESWAB CT/NG/T. Vaginalis. Declines HIV/RPR.   Intrauterine contraceptive device threads lost, initial encounter - Plan: US PELVIC COMPLETE WITH TRANSVAGINAL. IUD check up 07/2021 string visible 3 cm. No pain or change in bleeding pattern. Aware we are not currently doing [place] in house. Information provided on local imaging centers.   Screening for cervical cancer - 2015 LGSIL with negative biopsy. Will repeat at 5-year interval  per guidelines.   Screening for breast cancer - Normal mammogram history.  Continue annual screenings.  Normal breast exam today. Mother diagnosed with breast cancer at age 65 and again recently.   Screening for colon cancer - Recommend colon cancer screening.   Return in 1 year for annual.     Olivia Mackie DNP, 2:23 PM 11/03/2022

## 2022-11-04 LAB — SURESWAB CT/NG/T. VAGINALIS
C. trachomatis RNA, TMA: NOT DETECTED
N. gonorrhoeae RNA, TMA: NOT DETECTED
Trichomonas vaginalis RNA: NOT DETECTED

## 2022-11-10 ENCOUNTER — Encounter: Payer: Self-pay | Admitting: Professional

## 2022-11-10 ENCOUNTER — Ambulatory Visit (INDEPENDENT_AMBULATORY_CARE_PROVIDER_SITE_OTHER): Payer: 59 | Admitting: Professional

## 2022-11-10 DIAGNOSIS — F4323 Adjustment disorder with mixed anxiety and depressed mood: Secondary | ICD-10-CM

## 2022-11-10 NOTE — Progress Notes (Signed)
[facility] Counselor/Therapist Progress Note  Patient ID: Robin Glover, MRN: [id number],    Date: 11/10/2022  Time Spent:  42 minutes 3-342pm  Treatment Type: Individual Therapy  Risk Assessment: Danger to Self:  No Self-injurious Behavior: No Danger to Others: No  Subjective: This session was held via video teletherapy. The patient consented to video teletherapy and was located a conference room at work during this session. She is aware it is the responsibility of the patient to secure confidentiality on her end of the session. The provider was in a private home office for the duration of this session.   The patient arrived on time for her Caregility appointment.  Issues addressed: 1-personal -doing better -her mother has continued to stonewall because she wants money -pt talked to her brother (he is in prison) who agrees with pt not to sell -he told pt that they could discuss when he gets out in February -pt stated she will pay six months of taxes due instead of requesting other family members for their part -pt reports that her deceased brother's are part of the choice to sell and she will need to consult them as well -her mother's name is till on the deed for 50% and then 50% is shared with pt, her brother, and her three nephews -pt thinks she could afford to pay out her mother's 50% so that she and her brother can keep the home -pt thinks about daily regarding how her mother has gone about it, and to protect her own peace she has not spoken with her mother   -pt has estranged herself from her mother as a result   -it's peaceful but there have been times she was banging on pt's door and pt did not to answer   -pt is not ready to speak with her mother   -she is unwilling to fuss and cuss or listen to her mother do so -pt reports "I love her, I just don' love her ways" 2-grief -pt reports some days are more bearable related to death of father -has not  been to her father's house again -pt states being able to go to his house is part of her grieving -pt is able to have good memories of her father -how to plan for his birthday and anniversary of his death 3-professional -she is on a 6/10 on job satisfaction -reduced stress would cause score to rise -pt is realizing she is not comfortable in leadership -she doesn't like the responsibility, other's poor communication skills -she has had to have conversations with staff and there was some resistance -consider training on leadership -pt is unsure of her interest in staying with this company -pt has expectations of having the processes and procedures in place -pt has minimal to moderate understanding of what they really want her to do   -her manager is the president, the sales person, the technical person and the chemist of the company and he is not successful at it -she has concerns related to company longevity due to poor responses to customers and lack of availability of products.  Treatment Plan Problems Addressed  Balancing Work and [employer], Caregiving of Aging Parents, Low Self-Esteem/Lack of Assertiveness, Single Parenting  Goals 1. Develop a balance between caregiving responsibilities and other roles, responsibilities, and interests. 2. Develop a realistic perspective regarding demands and obligations of multiple roles and their completion. 3. Develop a support system to assist in coping with stressors of single parenting. 4. Develop perception of  parental caregiving as an opportunity for psychological and personal growth; acknowledge the benefits and the challenges. 5. Develop time management strategies to reduce role overload and role conflicts. 6. Implement self-care strategies to maintain physical and emotional health. Objective Delegate responsibilities in the home. Target Date: 2022-12-03 Frequency: Biweekly  Progress: 0 Modality: individual  Objective Identify  and implement stress management and self-care strategies. Target Date: 2022-12-03 Frequency: Biweekly  Progress: 0 Modality: individual  Related Interventions Encourage the client to employ stress management and self-care activities (e.g., yoga, guided relaxation, exercise, massage, bubble baths). Objective Describe the challenges, demands, and stressors associated with single parenting. Target Date: 2022-12-03 Frequency: Biweekly  Progress: 0 Modality: individual  Related Interventions Validate the client's experiences of frustration, despair, and anxiety associated with single parenting and associated multiple roles. Objective Develop planning and problem-solving skills to more effectively cope with multiple demands. Target Date: 2022-12-03 Frequency: Biweekly  Progress: 0 Modality: individual  Related Interventions Role-play with the client planning and assertive problem-solving skills (e.g., planning and defining the needs for assistance; determining when help is needed, how to ask for help, and whom to ask). Objective Identify feelings and behavior related to attempting to balance work and family roles. Target Date: 2022-12-03 Frequency: Biweekly  Progress: 0 Modality: individual  Related Interventions Explore and identify the client's feelings of guilt, shame, and anxiety associated with not meeting internal or external expectations associated with balancing work and family roles. 7. Increase ability to express needs and desires openly and honestly. 8. Increase assertiveness skills and ability to advocate for self. 9. Increase openness to experiences and opportunities associated with risk-taking. Objective Practice saying "no" to at least one person, declining to comply with a request/favor or identify and assert rights, needs, and wants. Target Date: 2022-12-03 Frequency: Biweekly  Progress: 0 Modality: individual  Objective Identify at least three positive personal  attributes. Target Date: 2022-12-03 Frequency: Biweekly  Progress: 0 Modality: individual  Related Interventions Explore the client's fears of being assertive (e.g., fear of rejection, fear of ineffectiveness, fear of ridicule). Explore areas of competency with the client and encourage her to engage in related activities (e.g., dance, singing, arts, volunteer work, joining a book club). Objective Identify three roadblocks to improved self-esteem. Target Date: 2022-12-03 Frequency: Biweekly  Progress: 0 Modality: individual    Diagnosis:Adjustment disorder with mixed anxiety and depressed mood  Plan:   -next session will be on Monday, November 11,2024 at [facility]

## 2022-11-24 ENCOUNTER — Ambulatory Visit: Payer: 59 | Admitting: Professional

## 2022-11-24 ENCOUNTER — Encounter: Payer: Self-pay | Admitting: Professional

## 2022-11-24 DIAGNOSIS — F4323 Adjustment disorder with mixed anxiety and depressed mood: Secondary | ICD-10-CM

## 2022-11-24 NOTE — Progress Notes (Signed)
[facility] Counselor/Therapist Progress Note  Patient ID: Robin Glover, MRN: [id number],    Date: 11/24/2022  Time Spent:  51 minutes [id number]  Treatment Type: Individual Therapy  Risk Assessment: Danger to Self:  No Self-injurious Behavior: No Danger to Others: No  Subjective: This session was held via video teletherapy. The patient consented to video teletherapy and was located a conference room at work during this session. She is aware it is the responsibility of the patient to secure confidentiality on her end of the session. The provider was in a private home office for the duration of this session.   The patient arrived on time for her Caregility appointment.  Issues addressed: 1-personal -pt came home from work early due to not feeling well -her daughter just turned 81 -pt happy about her daughter being gone all the time -pt happy abut pt's plans to attend either [employer] or [employer] and major in nursing -pt would like her to have at least one year on campus for the experience while her daughter is unsure -daughter respects mother's opinions and opted out of getting a nose piercing -daughter [name] wanted to see hr grandmother and pt suggested she do so b-pt's mother -last night the pt had a dream we were no longer at odds with each other we just had a difference of opinion -she is concerned for her mother and would like to talk to her -she wants to draw a boundary with her mother related to not discussing the real estate issues 2-grief -pt has ben burying her grief lately and has not appropriately address it -pt reports one day in rehearsal she wanted her dad and felt like screaming -within the past month or so she has ben thinking a lot about what was occurring with her father including not visiting him on Christmas Day  -pt has regrets about not visiting him and thinks that is why she dreamed about her mother   -pt doesn't want her to die without  speaking with her   -she doesn't want her  3-brother Fayrene Fearing - 4-professional -works Wellsite geologist okay but still on fence about staying there  Treatment Plan Problems Addressed  Balancing Work and [employer], Caregiving of Aging Parents, Low Self-Esteem/Lack of Assertiveness, Single Parenting  Goals 1. Develop a balance between caregiving responsibilities and other roles, responsibilities, and interests. 2. Develop a realistic perspective regarding demands and obligations of multiple roles and their completion. 3. Develop a support system to assist in coping with stressors of single parenting. 4. Develop perception of parental caregiving as an opportunity for psychological and personal growth; acknowledge the benefits and the challenges. 5. Develop time management strategies to reduce role overload and role conflicts. 6. Implement self-care strategies to maintain physical and emotional health. Objective Delegate responsibilities in the home. Target Date: 2022-12-03 Frequency: Biweekly  Progress: 0 Modality: individual  Objective Identify and implement stress management and self-care strategies. Target Date: 2022-12-03 Frequency: Biweekly  Progress: 0 Modality: individual  Related Interventions Encourage the client to employ stress management and self-care activities (e.g., yoga, guided relaxation, exercise, massage, bubble baths). Objective Describe the challenges, demands, and stressors associated with single parenting. Target Date: 2022-12-03 Frequency: Biweekly  Progress: 0 Modality: individual  Related Interventions Validate the client's experiences of frustration, despair, and anxiety associated with single parenting and associated multiple roles. Objective Develop planning and problem-solving skills to more effectively cope with multiple demands. Target Date: 2022-12-03 Frequency: Biweekly  Progress: 0 Modality: individual  Related Interventions Role-play with  the client  planning and assertive problem-solving skills (e.g., planning and defining the needs for assistance; determining when help is needed, how to ask for help, and whom to ask). Objective Identify feelings and behavior related to attempting to balance work and family roles. Target Date: 2022-12-03 Frequency: Biweekly  Progress: 0 Modality: individual  Related Interventions Explore and identify the client's feelings of guilt, shame, and anxiety associated with not meeting internal or external expectations associated with balancing work and family roles. 7. Increase ability to express needs and desires openly and honestly. 8. Increase assertiveness skills and ability to advocate for self. 9. Increase openness to experiences and opportunities associated with risk-taking. Objective Practice saying "no" to at least one person, declining to comply with a request/favor or identify and assert rights, needs, and wants. Target Date: 2022-12-03 Frequency: Biweekly  Progress: 0 Modality: individual  Objective Identify at least three positive personal attributes. Target Date: 2022-12-03 Frequency: Biweekly  Progress: 0 Modality: individual  Related Interventions Explore the client's fears of being assertive (e.g., fear of rejection, fear of ineffectiveness, fear of ridicule). Explore areas of competency with the client and encourage her to engage in related activities (e.g., dance, singing, arts, volunteer work, joining a book club). Objective Identify three roadblocks to improved self-esteem. Target Date: 2022-12-03 Frequency: Biweekly  Progress: 0 Modality: individual    Diagnosis:Adjustment disorder with mixed anxiety and depressed mood  Plan:  -next session will be on Monday, December 08, 2022 at 3pm

## 2022-12-08 ENCOUNTER — Ambulatory Visit: Payer: 59 | Admitting: Professional

## 2022-12-10 ENCOUNTER — Ambulatory Visit: Payer: Managed Care, Other (non HMO)

## 2022-12-10 DIAGNOSIS — T8332XA Displacement of intrauterine contraceptive device, initial encounter: Secondary | ICD-10-CM

## 2022-12-22 ENCOUNTER — Ambulatory Visit: Payer: 59 | Admitting: Professional

## 2022-12-22 ENCOUNTER — Encounter: Payer: Self-pay | Admitting: Professional

## 2022-12-22 DIAGNOSIS — F330 Major depressive disorder, recurrent, mild: Secondary | ICD-10-CM | POA: Diagnosis not present

## 2022-12-22 NOTE — Progress Notes (Signed)
[facility] Counselor/Therapist Progress Note  Patient ID: Robin Glover, MRN: [id number],    Date: 12/22/2022  Time Spent:  44 minutes [id number]  Treatment Type: Individual Therapy  Risk Assessment: Danger to Self:  No Self-injurious Behavior: No Danger to Others: No  Subjective: This session was held via video teletherapy. The patient consented to video teletherapy and was located a conference room at work during this session. She is aware it is the responsibility of the patient to secure confidentiality on her end of the session. The provider was in a private home office for the duration of this session.   The patient arrived on time for her Caregility appointment.  Issues addressed: 1-personal -pt 's 46 year old daughter has used the car stranding her at home -pt was able to relax -how to plan for daughter moving to senior year and then college -pt thinks she may like to volunteer with kids or become a nanny or help new mother's -she sees herself always involved in theatre b-pt's mother -finally talked to mother and it went better than I expected it to do -mother is who she is and was not open to receive what she had to say   -she was  busy trying to justify that she did nothing wrong   -she didn't even address all the issues -pt shared that the daily calls about money was too much and she shut down   -her mother was upset that she "shut her out"   -she agreed to not sell the house -she feels good about the interactions she been having with her mother c-father's home -working on developing a plan to pay the taxes and other expenses -pt wants her brother to return to their father's house since it was familiar for him to heal   -mother doesn't really car about that because of their history   -she has always had to bail him out and deal with her brother d-relationships with men -Tommy left his wife and has an apartment   -he is inconsistent and has  some habits she does not agree with   -still talks to him and she reports they are not dating -[name]-she still talks to him but she doesn't consider anything seriously 2-grief -pt feels it''s time for her to move forward and not stay in a slump -she admits that she has been suffering with depression since her father passed -having the theater group has been helpful -"I feel like it's just time to move forward" -she feels a resurgence to move on 3-mood -today is a good day since she is pretty rested and had no problems at work today -she will begin using her cpap tonight since she picked up today 4-professional -works is okay -had a Therapist, nutritional who asked how things were going   -she was honest about how loose the company was and the technician that wants her job   -her Production designer, theatre/television/film called in her technician and stated the need to have a 30 minute talk to address any work issues and communication   -they want the technician to be second in command when pt is out   -she worries since the technician will make decisions behind her back and then pt has to deal with the clean up -staying put for now 5-updated treatment plan  Treatment Plan Problems Addressed  Balancing Work and [employer], Caregiving of Aging Parents, Low Self-Esteem/Lack of Assertiveness, Single Parenting  Goals 1. Develop a balance between caregiving  responsibilities and other roles, responsibilities, and interests. 2. Develop a realistic perspective regarding demands and obligations of multiple roles and their completion. 3. Develop a support system to assist in coping with stressors of single parenting. 4. Develop perception of parental caregiving as an opportunity for psychological and personal growth; acknowledge the benefits and the challenges. 5. Develop time management strategies to reduce role overload and role conflicts. 6. Implement self-care strategies to maintain physical and emotional  health. Objective Delegate responsibilities in the home. Target Date: 2023-12-03 Frequency: Biweekly  Progress: 0 Modality: individual  Objective Identify and implement stress management and self-care strategies. Target Date: 2023-12-03 Frequency: Biweekly  Progress: 0 Modality: individual  Related Interventions Encourage the client to employ stress management and self-care activities (e.g., yoga, guided relaxation, exercise, massage, bubble baths). Objective Describe the challenges, demands, and stressors associated with single parenting. Target Date: 2023-12-03 Frequency: Biweekly  Progress: 0 Modality: individual  Related Interventions Validate the client's experiences of frustration, despair, and anxiety associated with single parenting and associated multiple roles. Objective Develop planning and problem-solving skills to more effectively cope with multiple demands. Target Date: 2023-12-03 Frequency: Biweekly  Progress: 0 Modality: individual  Related Interventions Role-play with the client planning and assertive problem-solving skills (e.g., planning and defining the needs for assistance; determining when help is needed, how to ask for help, and whom to ask). Objective Identify feelings and behavior related to attempting to balance work and family roles. Target Date: 2023-12-03 Frequency: Biweekly  Progress: 0 Modality: individual  Related Interventions Explore and identify the client's feelings of guilt, shame, and anxiety associated with not meeting internal or external expectations associated with balancing work and family roles. 7. Increase ability to express needs and desires openly and honestly. 8. Increase assertiveness skills and ability to advocate for self. 9. Increase openness to experiences and opportunities associated with risk-taking. Objective Practice saying "no" to at least one person, declining to comply with a request/favor or identify and assert rights,  needs, and wants. Target Date: 2023-12-03 Frequency: Biweekly  Progress: 0 Modality: individual  Objective Identify at least three positive personal attributes. Target Date: 2023-12-03 Frequency: Biweekly  Progress: 0 Modality: individual  Related Interventions Explore the client's fears of being assertive (e.g., fear of rejection, fear of ineffectiveness, fear of ridicule). Explore areas of competency with the client and encourage her to engage in related activities (e.g., dance, singing, arts, volunteer work, joining a book club). Objective Identify three roadblocks to improved self-esteem. Target Date: 2023-12-03 Frequency: Biweekly  Progress: 0 Modality: individual    Diagnosis:Major depressive disorder, recurrent episode, mild (HCC)  Plan:  -next session will be on Monday, December 08, 2022 at 3pm

## 2023-01-05 ENCOUNTER — Ambulatory Visit: Payer: 59 | Admitting: Professional

## 2023-01-19 ENCOUNTER — Ambulatory Visit: Payer: 59 | Admitting: Professional

## 2023-01-20 ENCOUNTER — Encounter: Payer: Self-pay | Admitting: Professional

## 2023-01-20 ENCOUNTER — Ambulatory Visit (INDEPENDENT_AMBULATORY_CARE_PROVIDER_SITE_OTHER): Payer: 59 | Admitting: Professional

## 2023-01-20 DIAGNOSIS — F330 Major depressive disorder, recurrent, mild: Secondary | ICD-10-CM | POA: Diagnosis not present

## 2023-01-20 NOTE — Progress Notes (Signed)
 [facility] Counselor/Therapist Progress Note  Patient ID: Robin Glover, MRN: [id number],    Date: 01/20/2023  Time Spent:  34 minutes 921-955am  Treatment Type: Individual Therapy  Risk Assessment: Danger to Self:  No Self-injurious Behavior: No Danger to Others: No  Subjective: This session was held via video teletherapy. The patient consented to video teletherapy and was located a conference room at work during this session. She is aware it is the responsibility of the patient to secure confidentiality on her end of the session. The provider was in a private home office for the duration of this session.   The patient arrived late for her Caregility appointment. Patient did not release that she needed to appear in court today related to Child Support for her daughter. Patient reminded in the future quarter after the hour is the cut off time.  Issues addressed: 1-personal -daughter's father had stopped paying child support   -he is caught up but Child Support Enforcement is requiring him to appear -she is in the parking lot at the courthouse waiting for her time   -the court is on a weather delay b-pt's mother -pt continues to get along with her mother  -her mother is having a rent increase and states she cannot afford   -she wants to move into pt's father's home that mother own 50% interest c-father's home -mother has discussed needs for updates in order for her to live there -pt has suggested she can do the updates herself if that is what she wants -pt suggested he get a loan and pay off the loan and she will not be charged rent -pt admits that regardless of her mother living there she would need to put some money into the home in order to rent -Clinician suggested that pt consult a real estate attorney so that she can ask the questions needed to make an informed decision d-grief -pt thinks she is making strides in her grief walk -she does have some  thoughts of her father as his death anniversary approaches -pt's plan is to stay active so that she does not get overly focused on her loss  Treatment Plan Problems Addressed  Balancing Work and [employer], Caregiving of Aging Parents, Low Self-Esteem/Lack of Assertiveness, Single Parenting  Goals 1. Develop a balance between caregiving responsibilities and other roles, responsibilities, and interests. 2. Develop a realistic perspective regarding demands and obligations of multiple roles and their completion. 3. Develop a support system to assist in coping with stressors of single parenting. 4. Develop perception of parental caregiving as an opportunity for psychological and personal growth; acknowledge the benefits and the challenges. 5. Develop time management strategies to reduce role overload and role conflicts. 6. Implement self-care strategies to maintain physical and emotional health. Objective Delegate responsibilities in the home. Target Date: 2023-12-03 Frequency: Biweekly  Progress: 0 Modality: individual  Objective Identify and implement stress management and self-care strategies. Target Date: 2023-12-03 Frequency: Biweekly  Progress: 0 Modality: individual  Related Interventions Encourage the client to employ stress management and self-care activities (e.g., yoga, guided relaxation, exercise, massage, bubble baths). Objective Describe the challenges, demands, and stressors associated with single parenting. Target Date: 2023-12-03 Frequency: Biweekly  Progress: 0 Modality: individual  Related Interventions Validate the client's experiences of frustration, despair, and anxiety associated with single parenting and associated multiple roles. Objective Develop planning and problem-solving skills to more effectively cope with multiple demands. Target Date: 2023-12-03 Frequency: Biweekly  Progress: 0 Modality: individual  Related Interventions  Role-play with the  client planning and assertive problem-solving skills (e.g., planning and defining the needs for assistance; determining when help is needed, how to ask for help, and whom to ask). Objective Identify feelings and behavior related to attempting to balance work and family roles. Target Date: 2023-12-03 Frequency: Biweekly  Progress: 0 Modality: individual  Related Interventions Explore and identify the client's feelings of guilt, shame, and anxiety associated with not meeting internal or external expectations associated with balancing work and family roles. 7. Increase ability to express needs and desires openly and honestly. 8. Increase assertiveness skills and ability to advocate for self. 9. Increase openness to experiences and opportunities associated with risk-taking. Objective Practice saying no to at least one person, declining to comply with a request/favor or identify and assert rights, needs, and wants. Target Date: 2023-12-03 Frequency: Biweekly  Progress: 0 Modality: individual  Objective Identify at least three positive personal attributes. Target Date: 2023-12-03 Frequency: Biweekly  Progress: 0 Modality: individual  Related Interventions Explore the client's fears of being assertive (e.g., fear of rejection, fear of ineffectiveness, fear of ridicule). Explore areas of competency with the client and encourage her to engage in related activities (e.g., dance, singing, arts, volunteer work, joining a book club). Objective Identify three roadblocks to improved self-esteem. Target Date: 2023-12-03 Frequency: Biweekly  Progress: 0 Modality: individual    Diagnosis:Major depressive disorder, recurrent episode, mild (HCC)  Plan:  -next session will be on Friday, February 06, 2023 at [email or web address].

## 2023-02-06 ENCOUNTER — Ambulatory Visit (INDEPENDENT_AMBULATORY_CARE_PROVIDER_SITE_OTHER): Payer: 59 | Admitting: Professional

## 2023-02-06 ENCOUNTER — Encounter: Payer: Self-pay | Admitting: Professional

## 2023-02-06 DIAGNOSIS — F330 Major depressive disorder, recurrent, mild: Secondary | ICD-10-CM | POA: Diagnosis not present

## 2023-02-06 NOTE — Progress Notes (Signed)
[facility] Counselor/Therapist Progress Note  Patient ID: Robin Glover, MRN: [id number],    Date: 02/06/2023  Time Spent:  39 minutes 903-942am  Treatment Type: Individual Therapy  Risk Assessment: Danger to Self:  No Self-injurious Behavior: No Danger to Others: No  Subjective: This session was held via video teletherapy. The patient consented to video teletherapy and was located a conference room at work during this session. She is aware it is the responsibility of the patient to secure confidentiality on her end of the session. The provider was in a private home office for the duration of this session.   The patient arrived late for her Caregility appointment. Patient did not release that she needed to appear in court today related to Child Support for her daughter. Patient reminded in the future quarter after the hour is the cut off time.  Issues addressed: 1-family a-father's birthday -going to have dinner there -feeling melancholy b-pursuing her purpose -experiencing many changes but doesn't feel troubled or overwhelmed 2-relationships a-[name] and I cannot be in a dating relationship but can be really good friends -he is going to have a major surgery and asked her to help him   -she is waiting to find out what that entails and will do so as a friend b-Tony -in his mind he thinks we're dating each other but she does not -there are things in the relationship that she expects that he doesn't respond c-considering going online but unsure at this time  Treatment Plan Problems Addressed  Balancing Work and [employer], Caregiving of Aging Parents, Low Self-Esteem/Lack of Assertiveness, Single Parenting  Goals 1. Develop a balance between caregiving responsibilities and other roles, responsibilities, and interests. 2. Develop a realistic perspective regarding demands and obligations of multiple roles and their completion. 3. Develop a  support system to assist in coping with stressors of single parenting. 4. Develop perception of parental caregiving as an opportunity for psychological and personal growth; acknowledge the benefits and the challenges. 5. Develop time management strategies to reduce role overload and role conflicts. 6. Implement self-care strategies to maintain physical and emotional health. Objective Delegate responsibilities in the home. Target Date: 2023-12-03 Frequency: Biweekly  Progress: 0 Modality: individual  Objective Identify and implement stress management and self-care strategies. Target Date: 2023-12-03 Frequency: Biweekly  Progress: 0 Modality: individual  Related Interventions Encourage the client to employ stress management and self-care activities (e.g., yoga, guided relaxation, exercise, massage, bubble baths). Objective Describe the challenges, demands, and stressors associated with single parenting. Target Date: 2023-12-03 Frequency: Biweekly  Progress: 0 Modality: individual  Related Interventions Validate the client's experiences of frustration, despair, and anxiety associated with single parenting and associated multiple roles. Objective Develop planning and problem-solving skills to more effectively cope with multiple demands. Target Date: 2023-12-03 Frequency: Biweekly  Progress: 0 Modality: individual  Related Interventions Role-play with the client planning and assertive problem-solving skills (e.g., planning and defining the needs for assistance; determining when help is needed, how to ask for help, and whom to ask). Objective Identify feelings and behavior related to attempting to balance work and family roles. Target Date: 2023-12-03 Frequency: Biweekly  Progress: 0 Modality: individual  Related Interventions Explore and identify the client's feelings of guilt, shame, and anxiety associated with not meeting internal or external expectations associated with balancing work  and family roles. 7. Increase ability to express needs and desires openly and honestly. 8. Increase assertiveness skills and ability to advocate for self. 9. Increase openness to experiences and  opportunities associated with risk-taking. Objective Practice saying "no" to at least one person, declining to comply with a request/favor or identify and assert rights, needs, and wants. Target Date: 2023-12-03 Frequency: Biweekly  Progress: 0 Modality: individual  Objective Identify at least three positive personal attributes. Target Date: 2023-12-03 Frequency: Biweekly  Progress: 0 Modality: individual  Related Interventions Explore the client's fears of being assertive (e.g., fear of rejection, fear of ineffectiveness, fear of ridicule). Explore areas of competency with the client and encourage her to engage in related activities (e.g., dance, singing, arts, volunteer work, joining a book club). Objective Identify three roadblocks to improved self-esteem. Target Date: 2023-12-03 Frequency: Biweekly  Progress: 0 Modality: individual    Diagnosis:Major depressive disorder, recurrent episode, mild (HCC)  Plan:  -next session will be on Friday, February 06, 2023 at [place].

## 2023-03-02 ENCOUNTER — Ambulatory Visit: Payer: 59 | Admitting: Professional

## 2023-03-02 ENCOUNTER — Encounter: Payer: Self-pay | Admitting: Professional

## 2023-03-02 DIAGNOSIS — F330 Major depressive disorder, recurrent, mild: Secondary | ICD-10-CM | POA: Diagnosis not present

## 2023-03-02 DIAGNOSIS — F419 Anxiety disorder, unspecified: Secondary | ICD-10-CM | POA: Diagnosis not present

## 2023-03-02 NOTE — Progress Notes (Signed)
[facility] Counselor/Therapist Progress Note  Patient ID: Robin Glover, MRN: [id number],    Date: 03/02/2023  Time Spent:  40 minutes 306-346pm  Treatment Type: Individual Therapy  Risk Assessment: Danger to Self:  No Self-injurious Behavior: No Danger to Others: No  Subjective: This session was held via video teletherapy. The patient consented to video teletherapy and was located a conference room at work during this session. She is aware it is the responsibility of the patient to secure confidentiality on her end of the session. The provider was in a private home office for the duration of this session.   The patient arrived on time for her Caregility appointment.   Issues addressed: 1-mood a-"I been feeling some kinda way" coming off Valentine's Day and a fast b-saying to self that the men in her life are just potentials -talks to herself about allowing men that she has in her life  -she knows they are not what she wants 2-[name] a-luck of the straw with all his women that she was going to take care of him b-she had prayed that she would know what to do c-she took him day of surgery on Feb 3rd and he was back and the MD stopped surgery -he had contamination in the "field" as instruments were not ready -[name] was closed off form his mother about his surgery -the hospital contacted his mother as emergency contact and this was new information for her -[name] was angry about not having the surgery d-"I had to babysit him because of the anesthesia" -[name] jumped in his car and left within three hours of coming home to go get things done -pt reminded him about timing is everything and that God did not allow the surgery for a reason -she told him he needed to trust her prayer life e-pt talked to daughter [name] about it and told her she felt she was being put in a position to do what a wife or gf would do -since the surgery episode they have not had  much contact -she wants to understand why he chose her to be there for the surgery -she wanted to talk to him about an issue at work and she only wanted him to listen -pt became upset with him and left f-[name] surgery rescheduled for March 3rd -she is going to have to get someone else to help him since she has several shows that she is preparing for -pt admits that it is all about Radio broadcast assistant  Treatment Plan Problems Addressed  Balancing Work and [employer], Caregiving of Aging Parents, Low Self-Esteem/Lack of Assertiveness, Single Parenting  Goals 1. Develop a balance between caregiving responsibilities and other roles, responsibilities, and interests. 2. Develop a realistic perspective regarding demands and obligations of multiple roles and their completion. 3. Develop a support system to assist in coping with stressors of single parenting. 4. Develop perception of parental caregiving as an opportunity for psychological and personal growth; acknowledge the benefits and the challenges. 5. Develop time management strategies to reduce role overload and role conflicts. 6. Implement self-care strategies to maintain physical and emotional health. Objective Delegate responsibilities in the home. Target Date: 2023-12-03 Frequency: Biweekly  Progress: 0 Modality: individual  Objective Identify and implement stress management and self-care strategies. Target Date: 2023-12-03 Frequency: Biweekly  Progress: 0 Modality: individual  Related Interventions Encourage the client to employ stress management and self-care activities (e.g., yoga, guided relaxation, exercise, massage, bubble baths). Objective Describe the challenges, demands, and stressors associated with single  parenting. Target Date: 2023-12-03 Frequency: Biweekly  Progress: 0 Modality: individual  Related Interventions Validate the client's experiences of frustration, despair, and anxiety associated with single parenting  and associated multiple roles. Objective Develop planning and problem-solving skills to more effectively cope with multiple demands. Target Date: 2023-12-03 Frequency: Biweekly  Progress: 0 Modality: individual  Related Interventions Role-play with the client planning and assertive problem-solving skills (e.g., planning and defining the needs for assistance; determining when help is needed, how to ask for help, and whom to ask). Objective Identify feelings and behavior related to attempting to balance work and family roles. Target Date: 2023-12-03 Frequency: Biweekly  Progress: 0 Modality: individual  Related Interventions Explore and identify the client's feelings of guilt, shame, and anxiety associated with not meeting internal or external expectations associated with balancing work and family roles. 7. Increase ability to express needs and desires openly and honestly. 8. Increase assertiveness skills and ability to advocate for self. 9. Increase openness to experiences and opportunities associated with risk-taking. Objective Practice saying "no" to at least one person, declining to comply with a request/favor or identify and assert rights, needs, and wants. Target Date: 2023-12-03 Frequency: Biweekly  Progress: 0 Modality: individual  Objective Identify at least three positive personal attributes. Target Date: 2023-12-03 Frequency: Biweekly  Progress: 0 Modality: individual  Related Interventions Explore the client's fears of being assertive (e.g., fear of rejection, fear of ineffectiveness, fear of ridicule). Explore areas of competency with the client and encourage her to engage in related activities (e.g., dance, singing, arts, volunteer work, joining a book club). Objective Identify three roadblocks to improved self-esteem. Target Date: 2023-12-03 Frequency: Biweekly  Progress: 0 Modality: individual    Diagnosis:Major depressive disorder, recurrent episode, mild  (HCC)  Anxiety  Plan:  -next session will be on Monday, April 13 2023 at 3pm.

## 2023-03-16 ENCOUNTER — Ambulatory Visit: Payer: 59 | Admitting: Professional

## 2023-03-30 ENCOUNTER — Ambulatory Visit: Payer: 59 | Admitting: Professional

## 2023-04-13 ENCOUNTER — Ambulatory Visit (INDEPENDENT_AMBULATORY_CARE_PROVIDER_SITE_OTHER): Payer: 59 | Admitting: Professional

## 2023-04-13 ENCOUNTER — Encounter: Payer: Self-pay | Admitting: Professional

## 2023-04-13 DIAGNOSIS — F419 Anxiety disorder, unspecified: Secondary | ICD-10-CM | POA: Diagnosis not present

## 2023-04-13 DIAGNOSIS — F330 Major depressive disorder, recurrent, mild: Secondary | ICD-10-CM

## 2023-04-13 NOTE — Progress Notes (Signed)
 [facility] Counselor/Therapist Progress Note  Patient ID: Robin Glover, MRN: [id number],    Date: 04/13/2023  Time Spent:  50 minutes [phone]  Treatment Type: Individual Therapy  Risk Assessment: Danger to Self:  No Self-injurious Behavior: No Danger to Others: No  Subjective: This session was held via video teletherapy. The patient consented to video teletherapy and was located a conference room at work during this session. She is aware it is the responsibility of the patient to secure confidentiality on her end of the session. The provider was in a private home office for the duration of this session.   The patient arrived on time for her Robin Glover appointment.   Issues addressed: 1-mood a-tired, warn out 2-daughter -had her first prom Forensic scientist) -pt admits it was too expensive 3-intimate relationships a-discounting or shortchanging self related to intimate relationships, particularly Robin Glover -pt admits she over-schedules and she doesn't have to deal with -she is not sure what she wants -she doesn't want: inconsistency, lack of spiritual maturity, lack of consistent communication and lack of clear and open communication 4-Robin Glover a-only option with Robin Glover is friends b-his mother is living with him and she is helping with her care -pt has not established how long she can assist -pt has been doing 3-4 days/wk 2-3 hrs at a time c-his mother decided she could no longer live independently -felt her neighbor was out to get her -she was [place] Witness (JW) and thinks they are after her and would always come after her -was at brother's didn't work out, was at Robin Glover and had a fall because she was not being care for, and how is with Robin Glover d-pt has been married on one occasion to Robin Glover's (Robin Glover for short) father and she should have never married him   -he was muslim and did not adapt to the [place] way, he was raised african muslim -he begged her to  get pregnant and she thought it would help them to grow closer together as a family   -felt like she was a married single mother   -she expressed her feelings but he was not invested Runner, broadcasting/film/video a-still working to figure out her father's house and dealing with her mother -mother wants her and her brother buy them out b-her brother is out of jail but they have not yet seen each other yet -they were going to meet and they both ended up with different plans c-he is living with his wife d-had a job interview that did not pan out and continues to apply e-her only concern is that he was drinking the first day out with friends -he went to his mom's and it did not go well -he told her several days later that he was not going to drink anymore -he called several days after that and said he was out to dinner with his wife and he was drinking -she reports he tries and gives him grace for effort  Treatment Plan Problems Addressed  Balancing Work and Robin Glover, Caregiving of Aging Parents, Low Self-Esteem/Lack of Assertiveness, Single Parenting  Goals 1. Develop a balance between caregiving responsibilities and other roles, responsibilities, and interests. 2. Develop a realistic perspective regarding demands and obligations of multiple roles and their completion. 3. Develop a support system to assist in coping with stressors of single parenting. 4. Develop perception of parental caregiving as an opportunity for psychological and personal growth; acknowledge the benefits and the challenges. 5. Develop time management strategies to reduce role overload and role conflicts.  6. Implement self-care strategies to maintain physical and emotional health. Objective Delegate responsibilities in the home. Target Date: 2023-12-03 Frequency: Biweekly  Progress: 0 Modality: individual  Objective Identify and implement stress management and self-care strategies. Target Date: 2023-12-03 Frequency: Biweekly   Progress: 0 Modality: individual  Related Interventions Encourage the client to employ stress management and self-care activities (e.g., yoga, guided relaxation, exercise, massage, bubble baths). Objective Describe the challenges, demands, and stressors associated with single parenting. Target Date: 2023-12-03 Frequency: Biweekly  Progress: 0 Modality: individual  Related Interventions Validate the client's experiences of frustration, despair, and anxiety associated with single parenting and associated multiple roles. Objective Develop planning and problem-solving skills to more effectively cope with multiple demands. Target Date: 2023-12-03 Frequency: Biweekly  Progress: 0 Modality: individual  Related Interventions Role-play with the client planning and assertive problem-solving skills (e.g., planning and defining the needs for assistance; determining when help is needed, how to ask for help, and whom to ask). Objective Identify feelings and behavior related to attempting to balance work and family roles. Target Date: 2023-12-03 Frequency: Biweekly  Progress: 0 Modality: individual  Related Interventions Explore and identify the client's feelings of guilt, shame, and anxiety associated with not meeting internal or external expectations associated with balancing work and family roles. 7. Increase ability to express needs and desires openly and honestly. 8. Increase assertiveness skills and ability to advocate for self. 9. Increase openness to experiences and opportunities associated with risk-taking. Objective Practice saying "no" to at least one person, declining to comply with a request/favor or identify and assert rights, needs, and wants. Target Date: 2023-12-03 Frequency: Biweekly  Progress: 0 Modality: individual  Objective Identify at least three positive personal attributes. Target Date: 2023-12-03 Frequency: Biweekly  Progress: 0 Modality: individual  Related  Interventions Explore the client's fears of being assertive (e.g., fear of rejection, fear of ineffectiveness, fear of ridicule). Explore areas of competency with the client and encourage her to engage in related activities (e.g., dance, singing, arts, volunteer work, joining a book club). Objective Identify three roadblocks to improved self-esteem. Target Date: 2023-12-03 Frequency: Biweekly  Progress: 0 Modality: individual    Diagnosis:Major depressive disorder, recurrent episode, mild (HCC)  Anxiety  Plan:  -next session will be on Monday, April 26 2023 at 3pm.

## 2023-04-27 ENCOUNTER — Ambulatory Visit: Payer: 59 | Admitting: Professional

## 2023-05-11 ENCOUNTER — Ambulatory Visit (INDEPENDENT_AMBULATORY_CARE_PROVIDER_SITE_OTHER): Payer: 59 | Admitting: Professional

## 2023-05-11 ENCOUNTER — Encounter: Payer: Self-pay | Admitting: Professional

## 2023-05-11 DIAGNOSIS — F419 Anxiety disorder, unspecified: Secondary | ICD-10-CM | POA: Diagnosis not present

## 2023-05-11 DIAGNOSIS — F330 Major depressive disorder, recurrent, mild: Secondary | ICD-10-CM

## 2023-05-11 NOTE — Progress Notes (Signed)
 [facility] Counselor/Therapist Progress Note  Patient ID: Robin Glover, MRN: [id number],    Date: 05/11/2023  Time Spent:  50 minutes 316-4pm  Treatment Type: Individual Therapy  Risk Assessment: Danger to Self:  No Self-injurious Behavior: No Danger to Others: No  Subjective: This session was held via video teletherapy. The patient consented to video teletherapy and was located a conference room at work during this session. She is aware it is the responsibility of the patient to secure confidentiality on her end of the session. The provider was in a private home office for the duration of this session.   The patient arrived on time for her Caregility appointment.   Issues addressed: 1-mood -pt is tired -pt doesn't have the emotional energy to deal with her mother 2-situation with mom -she and mother are currently not communicating  -she hung up on her mother and after her mother texted back pt apologized via text   -pt addressed her treating her poorly when she is the only person that responds -their disagreement is about the house again 3-Aisha has PCOS and was picked up early today -she has been on birth control to regulate he cycle takes six month -she has a cycle every two weeks, having increased migraine frequency, and more hormonal -it has caused her weight gain and hair loss -she struggled today due to a lot of factors: getting to school late, 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, her bff is also running for the same position -her video is very good and she is making me proud, she is stepping up and navigating her career path -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 4-sang background for a friend in [place] and will be singing as a backup moving forward 5-"I did a thing" -I finally put myself out there on a dating app -was scary "but I don't have anything to  lose" 6-professional -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 Develop a balance between caregiving responsibilities and other roles, responsibilities, and interests. 2. Develop a realistic perspective regarding demands and obligations of multiple roles and their completion. 3. Develop a support system to assist in coping with stressors of single parenting. 4. Develop perception of parental caregiving as an opportunity for psychological and personal growth; acknowledge the benefits and the challenges. 5. Develop time management strategies to reduce role overload and role conflicts. 6. Implement self-care strategies to maintain physical and emotional health. Objective Delegate responsibilities in the home. Target Date: 2023-12-03 Frequency: Biweekly  Progress: 0 Modality: individual  Objective Identify and implement stress management and self-care strategies. Target Date: 2023-12-03 Frequency: Biweekly  Progress: 0 Modality: individual  Related Interventions Encourage the client to employ stress management and self-care activities (e.g., yoga, guided relaxation, exercise, massage, bubble baths). Objective Describe the challenges, demands, and stressors associated with single parenting. Target Date: 2023-12-03 Frequency: Biweekly  Progress: 0 Modality: individual  Related Interventions Validate the client's experiences  of frustration, despair, and anxiety associated with  single parenting and associated multiple roles. Objective Develop planning and problem-solving skills to more effectively cope with multiple demands. Target Date: 2023-12-03 Frequency: Biweekly  Progress: 0 Modality: individual  Related Interventions Role-play with the client planning and assertive problem-solving skills (e.g., planning and defining the needs for assistance; determining when help is needed, how to ask for help, and whom to ask). Objective Identify feelings and behavior related to attempting to balance work and family roles. Target Date: 2023-12-03 Frequency: Biweekly  Progress: 0 Modality: individual  Related Interventions Explore and identify the client's feelings of guilt, shame, and anxiety associated with not meeting internal or external expectations associated with balancing work and family roles. 7. Increase ability to express needs and desires openly and honestly. 8. Increase assertiveness skills and ability to advocate for self. 9. Increase openness to experiences and opportunities associated with risk-taking. Objective Practice saying "no" to at least one person, declining to comply with a request/favor or identify and assert rights, needs, and wants. Target Date: 2023-12-03 Frequency: Biweekly  Progress: 0 Modality: individual  Objective Identify at least three positive personal attributes. Target Date: 2023-12-03 Frequency: Biweekly  Progress: 0 Modality: individual  Related Interventions Explore the client's fears of being assertive (e.g., fear of rejection, fear of ineffectiveness, fear of ridicule). Explore areas of competency with the client and encourage her to engage in related activities (e.g., dance, singing, arts, volunteer work, joining a book club). Objective Identify three roadblocks to improved self-esteem. Target Date: 2023-12-03 Frequency: Biweekly  Progress: 0 Modality: individual    Diagnosis:Major depressive disorder, recurrent episode,  mild (HCC)  Anxiety  Plan:  -next session will be on Thursday, May 12, 2023 at 3pm.

## 2023-05-25 ENCOUNTER — Ambulatory Visit: Admitting: Professional

## 2023-06-11 ENCOUNTER — Encounter: Payer: Self-pay | Admitting: Professional

## 2023-06-11 ENCOUNTER — Ambulatory Visit: Admitting: Professional

## 2023-06-11 DIAGNOSIS — F330 Major depressive disorder, recurrent, mild: Secondary | ICD-10-CM

## 2023-06-11 DIAGNOSIS — F419 Anxiety disorder, unspecified: Secondary | ICD-10-CM | POA: Diagnosis not present
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
## 2023-06-23 ENCOUNTER — Encounter: Payer: Self-pay | Admitting: Professional

## 2023-06-23 ENCOUNTER — Ambulatory Visit (INDEPENDENT_AMBULATORY_CARE_PROVIDER_SITE_OTHER): Admitting: Professional

## 2023-06-23 DIAGNOSIS — F419 Anxiety disorder, unspecified: Secondary | ICD-10-CM

## 2023-06-23 DIAGNOSIS — F330 Major depressive disorder, recurrent, mild: Secondary | ICD-10-CM

## 2023-06-23 NOTE — Progress Notes (Signed)
 Robin Glover  Patient ID: Robin Glover, MRN: [id number],    Date: 06/23/2023  Time Spent:  37 minutes [id number]  Treatment Type: Individual Therapy  Risk Assessment: Danger to Self:  No Self-injurious Behavior: No Danger to Others: No  Subjective: This session was held via video teletherapy. The patient consented to video teletherapy and was located a conference room at work during this session. She is aware it is the responsibility of the patient to secure confidentiality on her end of the session. The provider was in a private home office for the duration of this session.   The patient arrived on time for her Caregility appointment.   Issues addressed: 1-Father's Day and brother birthday was on the 3rd -pt grieving the loss of both her father and her brother -pt can see being able to move forward despite her father and brother not being there -she called one son on his father's birthday and never got a response -she does not have much of a relationship with his kids because of distance -pt was close with her brother's children when they were younger -pt thinks staying in touch with them would be good for them to know that her [employer] is invested -she has 15 nieces/nephews she has not even met -her nieces/nephews have a group chat beginning at age 77 -Robin Glover will join the group once she turns 19 2-pt and mother have addressed the house situation -pt shared that the house is truly hers and what the pt wanted to do -mother asked if they wanted to have it together and pt was willing -her mother was going to think about -her mother called her last week and said she did not want to keep she wanted to sell -she wants the most profit from the sale -pt initially felt "some type of way" and felt she did not take the pt into  -pt talked with sister who helped her see that the next generation is not interested in "legacy  properties" -sister reminded her she is going to be free in a year when Robin Glover goes off to school -sister suggested she did not need to tie herself down -sister said they would need to coach and guide her mom and help her manage the money 3-mood -feeling numb and out of sorts   Treatment Plan Problems Addressed  Balancing Work and [employer], Caregiving of Aging Parents, Low Self-Esteem/Lack of Assertiveness, Single Parenting  Goals 1. Develop a balance between caregiving responsibilities and other roles, responsibilities, and interests. 2. Develop a realistic perspective regarding demands and obligations of multiple roles and their completion. 3. Develop a support system to assist in coping with stressors of single parenting. 4. Develop perception of parental caregiving as an opportunity for psychological and personal growth; acknowledge the benefits and the challenges. 5. Develop time management strategies to reduce role overload and role conflicts. 6. Implement self-care strategies to maintain physical and emotional health. Objective Delegate responsibilities in the home. Target Date: 2023-12-03 Frequency: Biweekly  Progress: 0 Modality: individual  Objective Identify and implement stress management and self-care strategies. Target Date: 2023-12-03 Frequency: Biweekly  Progress: 0 Modality: individual  Related Interventions Encourage the client to employ stress management and self-care activities (e.g., yoga, guided relaxation, exercise, massage, bubble baths). Objective Describe the challenges, demands, and stressors associated with single parenting. Target Date: 2023-12-03 Frequency: Biweekly  Progress: 0 Modality: individual  Related Interventions Validate the client's experiences of frustration, despair, and anxiety associated with single parenting  and associated multiple roles. Objective Develop planning and problem-solving skills to more effectively cope with  multiple demands. Target Date: 2023-12-03 Frequency: Biweekly  Progress: 0 Modality: individual  Related Interventions Role-play with the client planning and assertive problem-solving skills (e.g., planning and defining the needs for assistance; determining when help is needed, how to ask for help, and whom to ask). Objective Identify feelings and behavior related to attempting to balance work and family roles. Target Date: 2023-12-03 Frequency: Biweekly  Progress: 0 Modality: individual  Related Interventions Explore and identify the client's feelings of guilt, shame, and anxiety associated with not meeting internal or external expectations associated with balancing work and family roles. 7. Increase ability to express needs and desires openly and honestly. 8. Increase assertiveness skills and ability to advocate for self. 9. Increase openness to experiences and opportunities associated with risk-taking. Objective Practice saying "no" to at least one person, declining to comply with a request/favor or identify and assert rights, needs, and wants. Target Date: 2023-12-03 Frequency: Biweekly  Progress: 0 Modality: individual  Objective Identify at least three positive personal attributes. Target Date: 2023-12-03 Frequency: Biweekly  Progress: 0 Modality: individual  Related Interventions Explore the client's fears of being assertive (e.g., fear of rejection, fear of ineffectiveness, fear of ridicule). Explore areas of competency with the client and encourage her to engage in related activities (e.g., dance, singing, arts, volunteer work, joining a book club). Objective Identify three roadblocks to improved self-esteem. Target Date: 2023-12-03 Frequency: Biweekly  Progress: 0 Modality: individual    Diagnosis:Major depressive disorder, recurrent episode, mild (HCC)  Anxiety  Plan:  -next session will be on Monday, July 06, 2023 at 3pm.

## 2023-06-26 ENCOUNTER — Ambulatory Visit
Admission: EM | Admit: 2023-06-26 | Discharge: 2023-06-26 | Disposition: A | Discharge status: Home / Self Care | Attending: Internal Medicine | Admitting: Internal Medicine

## 2023-06-26 ENCOUNTER — Other Ambulatory Visit: Payer: Self-pay

## 2023-06-26 DIAGNOSIS — M542 Cervicalgia: Secondary | ICD-10-CM | POA: Diagnosis not present

## 2023-06-26 MED ORDER — KETOROLAC TROMETHAMINE 15 MG/ML IJ SOLN
15.0000 mg | Freq: Once | INTRAMUSCULAR | Status: AC
  Administered 2023-06-26: 15 mg via INTRAMUSCULAR

## 2023-06-26 MED ORDER — KETOROLAC TROMETHAMINE 30 MG/ML IJ SOLN: 30.0000 mg | Freq: Once | INTRAMUSCULAR | Status: DC

## 2023-06-26 MED ORDER — BACLOFEN 5 MG PO TABS: 5.0000 mg | ORAL_TABLET | Freq: Three times a day (TID) | ORAL | 0 refills | Status: DC | PRN
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
## 2023-06-26 NOTE — Discharge Instructions (Signed)
 You were given an injection (Toradol) for your neck pain today. This will help with the pain and inflammation. I recommend you avoid NSAIDs for the rest of the day given your injection today in office. NSAIDs include ibuprofen, advil, aleve, and motrin.  You were also given a prescription for a muscle relaxer (Baclofen) take at bedtime when you are not driving or working because it may cause dizziness/drowsiness.  You were also given an order for an x-ray. Take this to [facility] for [facility]. You can walk right in with your x-ray order and do not have to be seen.    Please seek prompt medical care if: You have: A very bad (severe) headache that is not helped by medicine. Trouble walking or weakness in your arms and legs. Clear or bloody fluid coming from your nose or ears. Changes in your seeing (vision). Jerky movements that you cannot control (seizure). You throw up (vomit). Your symptoms get worse. You lose balance. Your speech is slurred. You pass out. You are sleepier and have trouble staying awake. The black centers of your eyes (pupils) change in size.  These symptoms may be an emergency. Do not wait to see if the symptoms will go away. Get medical help right away. Call your local emergency services. Do not drive yourself to the hospital.

## 2023-06-27 ENCOUNTER — Ambulatory Visit

## 2023-06-27 ENCOUNTER — Telehealth: Payer: Self-pay | Admitting: Internal Medicine

## 2023-06-27 ENCOUNTER — Ambulatory Visit
Admission: EM | Admit: 2023-06-27 | Discharge: 2023-06-27 | Disposition: A | Discharge status: Home / Self Care | Attending: Family Medicine | Admitting: Family Medicine

## 2023-06-27 NOTE — Telephone Encounter (Signed)
 Patient returned call to the clinic. Verified patient's DOB. Patient was notified that imaging was unremarkable except for arthritic changes in the lower c-spine. Patient instructed to follow up with her PCP if pain persists for further imaging.

## 2023-06-27 NOTE — Telephone Encounter (Signed)
 Attempted to contact patient in regards to the results of her c-spine images. Patient did not answer and unable to leave a message.

## 2023-07-06 ENCOUNTER — Ambulatory Visit: Admitting: Professional

## 2023-08-03 ENCOUNTER — Ambulatory Visit: Admitting: Professional

## 2023-08-31 ENCOUNTER — Encounter: Payer: Self-pay | Admitting: Professional

## 2023-08-31 ENCOUNTER — Ambulatory Visit (INDEPENDENT_AMBULATORY_CARE_PROVIDER_SITE_OTHER): Admitting: Professional

## 2023-08-31 DIAGNOSIS — F330 Major depressive disorder, recurrent, mild: Secondary | ICD-10-CM | POA: Diagnosis not present

## 2023-08-31 DIAGNOSIS — F419 Anxiety disorder, unspecified: Secondary | ICD-10-CM
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
## 2023-09-28 ENCOUNTER — Ambulatory Visit: Payer: Self-pay | Admitting: Professional

## 2023-10-26 ENCOUNTER — Ambulatory Visit: Payer: Self-pay | Admitting: Professional

## 2023-11-05 NOTE — Progress Notes (Unsigned)
 Robin Glover 1975/12/27 [id number]   History:  48 y.o. G1P1001 presents for annual exam. Mirena  IUD 07/2021, occasional light spotting. Has noticed some mood changes but denies other menopausal symptoms. 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 Normal mammogram history. DM, HTN, HLD, vitamin D  deficiency managed by PCP.   Gynecologic History Patient's last menstrual period was 11/04/2023.   Contraception/Family planning: IUD Sexually active: Yes  Health Maintenance Last Pap: 05/06/2021. Results were: Normal neg HPV Last mammogram: 01/2023. Results were: Normal per patient Last colonoscopy: 2024. Results were: Normal, 10-year recall Last Dexa: Not indicated     11/06/2023    7:46 AM  Depression screen PHQ 2/9  Decreased Interest 0  Down, Depressed, Hopeless 0  PHQ - 2 Score 0     Past medical history, past surgical history, family history and social history were all reviewed and documented in the EPIC chart. 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 Mother with history of breast cancer at age 10 and again recently.   ROS:  A ROS was performed and pertinent positives and negatives are included.  Exam:  Vitals:   11/06/23 0737  BP: 122/78  Pulse: 90  SpO2: 98%  Weight: 237 lb (107.5 kg)  Height: 5' 6.5 (1.689 m)      Body mass index is 37.68 kg/m.  General appearance:  Normal Thyroid :  Symmetrical, normal in size, without palpable masses or nodularity. Respiratory  Auscultation:  Clear without wheezing or rhonchi Cardiovascular  Auscultation:  Regular rate, without rubs, murmurs or gallops  Edema/varicosities:  Not grossly evident Abdominal  Soft,nontender, without masses, guarding or rebound.  Liver/spleen:  No organomegaly noted  Hernia:  None appreciated  Skin  Inspection:  Grossly normal Breasts: Examined lying and sitting.   Right: Without masses,  retractions, nipple discharge or axillary adenopathy.   Left: Without masses, retractions, nipple discharge or axillary adenopathy. Pelvic: External genitalia:  no lesions              Urethra:  normal appearing urethra with no masses, tenderness or lesions              Bartholins and Skenes: normal                 Vagina: normal appearing vagina with normal color and discharge, no lesions              Cervix: no lesions. IUD string not visible (subsequent encounter) Bimanual Exam:  Uterus:  no masses or tenderness              Adnexa: no mass, fullness, tenderness              Rectovaginal: Deferred              Anus:  normal, no lesions  Patient informed chaperone available to be present for breast and pelvic exam. Patient has requested no chaperone to be present. Patient has been advised what will be completed during breast and pelvic exam.   Assessment/Plan:  48 y.o. G1P1001 for annual exam.   Well female exam with routine gynecological exam - Education provided on SBEs, importance of preventative screenings, current guidelines, high calcium diet, regular exercise, and multivitamin daily.  Labs with PCP.   Encounter for routine checking of intrauterine contraceptive device (IUD) - Mirena  07/2021, occasional spotting. US  11/2022 showed IUD within position in uterine cavity.   Screening for cervical cancer -  2015 LGSIL with negative biopsy. Will repeat at 5-year interval per guidelines.   Screening for breast cancer - Normal mammogram history.  Continue annual screenings.  Normal breast exam today. Mother diagnosed with breast cancer at age 9 and again recently.   Screening for colon cancer - 2024 colonoscopy per patient. Will repeat at GI's recommended interval.   Return in about 1 year (around 11/05/2024) for Annual.     Robin DELENA Shutter DNP, 7:59 AM 11/06/2023

## 2023-11-06 ENCOUNTER — Ambulatory Visit (INDEPENDENT_AMBULATORY_CARE_PROVIDER_SITE_OTHER): Admitting: Nurse Practitioner

## 2023-11-06 ENCOUNTER — Encounter: Payer: Self-pay | Admitting: Nurse Practitioner

## 2023-11-06 VITALS — BP 122/78 | HR 90 | Ht 66.5 in | Wt 237.0 lb

## 2023-11-06 DIAGNOSIS — Z01419 Encounter for gynecological examination (general) (routine) without abnormal findings: Secondary | ICD-10-CM | POA: Diagnosis not present

## 2023-11-06 DIAGNOSIS — Z30431 Encounter for routine checking of intrauterine contraceptive device: Secondary | ICD-10-CM

## 2023-11-06 DIAGNOSIS — Z1331 Encounter for screening for depression: Secondary | ICD-10-CM

## 2024-11-09 ENCOUNTER — Ambulatory Visit: Admitting: Nurse Practitioner
